# Patient Record
Sex: Female | Born: 1950
Health system: Southern US, Community
[De-identification: ages and names within clinical notes are randomized; demographics above are authoritative.]

## PROBLEM LIST (undated history)

## (undated) DIAGNOSIS — I10 Essential (primary) hypertension: Secondary | ICD-10-CM

## (undated) DIAGNOSIS — Z9289 Personal history of other medical treatment: Secondary | ICD-10-CM

## (undated) DIAGNOSIS — I2699 Other pulmonary embolism without acute cor pulmonale: Secondary | ICD-10-CM

## (undated) DIAGNOSIS — M1990 Unspecified osteoarthritis, unspecified site: Secondary | ICD-10-CM

## (undated) DIAGNOSIS — R569 Unspecified convulsions: Secondary | ICD-10-CM

## (undated) DIAGNOSIS — J302 Other seasonal allergic rhinitis: Secondary | ICD-10-CM

## (undated) DIAGNOSIS — Z8711 Personal history of peptic ulcer disease: Secondary | ICD-10-CM

## (undated) DIAGNOSIS — C641 Malignant neoplasm of right kidney, except renal pelvis: Secondary | ICD-10-CM

## (undated) DIAGNOSIS — IMO0002 Reserved for concepts with insufficient information to code with codable children: Secondary | ICD-10-CM

## (undated) DIAGNOSIS — D649 Anemia, unspecified: Secondary | ICD-10-CM

## (undated) DIAGNOSIS — E785 Hyperlipidemia, unspecified: Secondary | ICD-10-CM

## (undated) DIAGNOSIS — J189 Pneumonia, unspecified organism: Secondary | ICD-10-CM

## (undated) DIAGNOSIS — K219 Gastro-esophageal reflux disease without esophagitis: Secondary | ICD-10-CM

## (undated) DIAGNOSIS — E119 Type 2 diabetes mellitus without complications: Secondary | ICD-10-CM

## (undated) DIAGNOSIS — Z8719 Personal history of other diseases of the digestive system: Secondary | ICD-10-CM

## (undated) DIAGNOSIS — T7840XA Allergy, unspecified, initial encounter: Secondary | ICD-10-CM

## (undated) HISTORY — DX: Allergy, unspecified, initial encounter: T78.40XA

## (undated) HISTORY — DX: Essential (primary) hypertension: I10

## (undated) HISTORY — PX: COLONOSCOPY: SHX174

## (undated) HISTORY — PX: ECTOPIC PREGNANCY SURGERY: SHX613

## (undated) HISTORY — PX: JOINT REPLACEMENT: SHX530

## (undated) HISTORY — DX: Reserved for concepts with insufficient information to code with codable children: IMO0002

## (undated) HISTORY — PX: UPPER GASTROINTESTINAL ENDOSCOPY: SHX188

## (undated) HISTORY — PX: LUMBAR DISC SURGERY: SHX700

## (undated) HISTORY — PX: BACK SURGERY: SHX140

## (undated) HISTORY — DX: Hyperlipidemia, unspecified: E78.5

---

## 1985-03-30 HISTORY — PX: ABDOMINAL HYSTERECTOMY: SHX81

## 1998-03-30 DIAGNOSIS — C641 Malignant neoplasm of right kidney, except renal pelvis: Secondary | ICD-10-CM

## 1998-03-30 HISTORY — PX: NEPHRECTOMY: SHX65

## 1998-03-30 HISTORY — DX: Malignant neoplasm of right kidney, except renal pelvis: C64.1

## 1998-05-13 ENCOUNTER — Inpatient Hospital Stay (HOSPITAL_COMMUNITY): Admission: RE | Admit: 1998-05-13 | Discharge: 1998-05-16 | Payer: Self-pay | Admitting: Urology

## 1999-02-28 ENCOUNTER — Encounter: Admission: RE | Admit: 1999-02-28 | Discharge: 1999-02-28 | Payer: Self-pay | Admitting: Urology

## 1999-02-28 ENCOUNTER — Encounter: Payer: Self-pay | Admitting: Urology

## 1999-09-03 ENCOUNTER — Encounter: Admission: RE | Admit: 1999-09-03 | Discharge: 1999-09-03 | Payer: Self-pay | Admitting: Urology

## 1999-09-03 ENCOUNTER — Encounter: Payer: Self-pay | Admitting: Urology

## 2000-02-25 ENCOUNTER — Encounter: Admission: RE | Admit: 2000-02-25 | Discharge: 2000-02-25 | Payer: Self-pay | Admitting: Urology

## 2000-02-25 ENCOUNTER — Encounter: Payer: Self-pay | Admitting: Urology

## 2000-06-07 ENCOUNTER — Emergency Department (HOSPITAL_COMMUNITY): Admission: EM | Admit: 2000-06-07 | Discharge: 2000-06-08 | Payer: Self-pay | Admitting: Emergency Medicine

## 2000-09-14 ENCOUNTER — Encounter: Admission: RE | Admit: 2000-09-14 | Discharge: 2000-09-14 | Payer: Self-pay | Admitting: Urology

## 2000-09-14 ENCOUNTER — Encounter: Payer: Self-pay | Admitting: Urology

## 2001-03-21 ENCOUNTER — Encounter: Payer: Self-pay | Admitting: Urology

## 2001-03-21 ENCOUNTER — Encounter: Admission: RE | Admit: 2001-03-21 | Discharge: 2001-03-21 | Payer: Self-pay | Admitting: Urology

## 2001-09-16 ENCOUNTER — Encounter: Admission: RE | Admit: 2001-09-16 | Discharge: 2001-09-16 | Payer: Self-pay | Admitting: Urology

## 2001-09-16 ENCOUNTER — Encounter: Payer: Self-pay | Admitting: Urology

## 2002-03-03 ENCOUNTER — Encounter: Payer: Self-pay | Admitting: Urology

## 2002-03-03 ENCOUNTER — Encounter: Admission: RE | Admit: 2002-03-03 | Discharge: 2002-03-03 | Payer: Self-pay | Admitting: Urology

## 2002-09-08 ENCOUNTER — Ambulatory Visit (HOSPITAL_COMMUNITY): Admission: RE | Admit: 2002-09-08 | Discharge: 2002-09-08 | Payer: Self-pay | Admitting: Urology

## 2002-09-08 ENCOUNTER — Encounter: Payer: Self-pay | Admitting: Urology

## 2003-01-30 ENCOUNTER — Ambulatory Visit (HOSPITAL_COMMUNITY): Admission: RE | Admit: 2003-01-30 | Discharge: 2003-01-30 | Payer: Self-pay | Admitting: Gastroenterology

## 2003-03-13 ENCOUNTER — Ambulatory Visit (HOSPITAL_COMMUNITY): Admission: RE | Admit: 2003-03-13 | Discharge: 2003-03-13 | Payer: Self-pay | Admitting: Urology

## 2004-03-05 ENCOUNTER — Ambulatory Visit (HOSPITAL_COMMUNITY): Admission: RE | Admit: 2004-03-05 | Discharge: 2004-03-05 | Payer: Self-pay | Admitting: Urology

## 2004-04-24 ENCOUNTER — Ambulatory Visit: Payer: Self-pay | Admitting: Gastroenterology

## 2004-05-05 ENCOUNTER — Ambulatory Visit: Payer: Self-pay | Admitting: Gastroenterology

## 2007-08-30 ENCOUNTER — Emergency Department (HOSPITAL_COMMUNITY): Admission: EM | Admit: 2007-08-30 | Discharge: 2007-08-31 | Payer: Self-pay | Admitting: Emergency Medicine

## 2010-05-01 ENCOUNTER — Inpatient Hospital Stay: Admission: AD | Admit: 2010-05-01 | Payer: Self-pay

## 2010-08-22 ENCOUNTER — Emergency Department (HOSPITAL_BASED_OUTPATIENT_CLINIC_OR_DEPARTMENT_OTHER)
Admission: EM | Admit: 2010-08-22 | Discharge: 2010-08-22 | Disposition: A | Discharge status: Home / Self Care | Payer: BC Managed Care – PPO | Attending: Emergency Medicine | Admitting: Emergency Medicine

## 2010-08-22 DIAGNOSIS — Z79899 Other long term (current) drug therapy: Secondary | ICD-10-CM | POA: Insufficient documentation

## 2010-08-22 DIAGNOSIS — R040 Epistaxis: Secondary | ICD-10-CM | POA: Insufficient documentation

## 2010-08-22 DIAGNOSIS — E119 Type 2 diabetes mellitus without complications: Secondary | ICD-10-CM | POA: Insufficient documentation

## 2010-08-22 DIAGNOSIS — I10 Essential (primary) hypertension: Secondary | ICD-10-CM | POA: Insufficient documentation

## 2010-08-22 LAB — DIFFERENTIAL
Basophils Absolute: 0 10*3/uL (ref 0.0–0.1)
Basophils Relative: 0 % (ref 0–1)
Eosinophils Absolute: 0.2 10*3/uL (ref 0.0–0.7)
Eosinophils Relative: 1 % (ref 0–5)
Lymphocytes Relative: 11 % — ABNORMAL LOW (ref 12–46)
Lymphs Abs: 1.4 10*3/uL (ref 0.7–4.0)
Monocytes Absolute: 0.4 10*3/uL (ref 0.1–1.0)
Monocytes Relative: 3 % (ref 3–12)
Neutro Abs: 11.1 10*3/uL — ABNORMAL HIGH (ref 1.7–7.7)
Neutrophils Relative %: 85 % — ABNORMAL HIGH (ref 43–77)

## 2010-08-22 LAB — CBC
HCT: 35.3 % — ABNORMAL LOW (ref 36.0–46.0)
Hemoglobin: 11.7 g/dL — ABNORMAL LOW (ref 12.0–15.0)
MCH: 30.8 pg (ref 26.0–34.0)
MCHC: 33.1 g/dL (ref 30.0–36.0)
MCV: 92.9 fL (ref 78.0–100.0)
Platelets: 343 10*3/uL (ref 150–400)
RBC: 3.8 MIL/uL — ABNORMAL LOW (ref 3.87–5.11)
RDW: 13.7 % (ref 11.5–15.5)
WBC: 13.1 10*3/uL — ABNORMAL HIGH (ref 4.0–10.5)

## 2010-08-22 LAB — PROTIME-INR
INR: 0.91 (ref 0.00–1.49)
Prothrombin Time: 12.5 seconds (ref 11.6–15.2)

## 2010-08-22 LAB — COMPREHENSIVE METABOLIC PANEL
ALT: 16 U/L (ref 0–35)
AST: 19 U/L (ref 0–37)
Albumin: 3.5 g/dL (ref 3.5–5.2)
Alkaline Phosphatase: 80 U/L (ref 39–117)
CO2: 26 mEq/L (ref 19–32)
Chloride: 104 mEq/L (ref 96–112)
Creatinine, Ser: 1.1 mg/dL (ref 0.4–1.2)
GFR calc Af Amer: >60 mL/min (ref >60)
GFR calc non Af Amer: 50 mL/min — ABNORMAL LOW (ref >60)
Potassium: 4.3 mEq/L (ref 3.5–5.1)
Total Bilirubin: 0.2 mg/dL — ABNORMAL LOW (ref 0.3–1.2)

## 2010-12-25 LAB — DIFFERENTIAL
Basophils Relative: 0
Eosinophils Absolute: 0.2
Lymphs Abs: 3.5
Monocytes Absolute: 0.4
Monocytes Relative: 5
Neutrophils Relative %: 57

## 2010-12-25 LAB — COMPREHENSIVE METABOLIC PANEL
ALT: 22
AST: 24
Albumin: 3.8
Alkaline Phosphatase: 101
Calcium: 9.4
GFR calc Af Amer: 60 — ABNORMAL LOW
Potassium: 3.8
Sodium: 135
Total Protein: 7.6

## 2010-12-25 LAB — CBC
Hemoglobin: 13.3
MCHC: 34.4
Platelets: 358
RDW: 13.2

## 2011-03-26 ENCOUNTER — Ambulatory Visit (INDEPENDENT_AMBULATORY_CARE_PROVIDER_SITE_OTHER): Payer: BC Managed Care – PPO

## 2011-03-26 DIAGNOSIS — E119 Type 2 diabetes mellitus without complications: Secondary | ICD-10-CM

## 2011-03-26 DIAGNOSIS — N760 Acute vaginitis: Secondary | ICD-10-CM

## 2011-03-26 DIAGNOSIS — N952 Postmenopausal atrophic vaginitis: Secondary | ICD-10-CM

## 2011-04-16 ENCOUNTER — Encounter: Payer: Self-pay | Admitting: Gastroenterology

## 2011-05-15 ENCOUNTER — Encounter: Payer: Self-pay | Admitting: Gastroenterology

## 2011-05-15 ENCOUNTER — Ambulatory Visit (AMBULATORY_SURGERY_CENTER): Payer: BC Managed Care – PPO | Admitting: *Deleted

## 2011-05-15 VITALS — Ht 68.0 in | Wt 250.0 lb

## 2011-05-15 DIAGNOSIS — Z86010 Personal history of colonic polyps: Secondary | ICD-10-CM

## 2011-05-15 DIAGNOSIS — Z1211 Encounter for screening for malignant neoplasm of colon: Secondary | ICD-10-CM

## 2011-05-15 MED ORDER — PEG-KCL-NACL-NASULF-NA ASC-C 100 G PO SOLR: ORAL | Status: DC

## 2011-05-28 ENCOUNTER — Ambulatory Visit (AMBULATORY_SURGERY_CENTER): Payer: BC Managed Care – PPO | Admitting: Gastroenterology

## 2011-05-28 ENCOUNTER — Encounter: Payer: Self-pay | Admitting: Gastroenterology

## 2011-05-28 DIAGNOSIS — Z860100 Personal history of colon polyps, unspecified: Secondary | ICD-10-CM

## 2011-05-28 DIAGNOSIS — Z1211 Encounter for screening for malignant neoplasm of colon: Secondary | ICD-10-CM

## 2011-05-28 DIAGNOSIS — K5730 Diverticulosis of large intestine without perforation or abscess without bleeding: Secondary | ICD-10-CM

## 2011-05-28 MED ORDER — SODIUM CHLORIDE 0.9 % IV SOLN: 500.0000 mL | INTRAVENOUS | Status: DC

## 2011-05-28 NOTE — Op Note (Signed)
[facility] [address] [place], [place]  [address]  COLONOSCOPY PROCEDURE REPORT  PATIENT:  Cassiopeia, Florentino  MR#:  [id number] BIRTHDATE:  15-Feb-1951, 60 yrs. old  GENDER:  female ENDOSCOPIST:  Barbette Hair. Arlyce Dice, MD REF. BY:  Tracey Harries, M.D. PROCEDURE DATE:  05/28/2011 PROCEDURE:  Diagnostic Colonoscopy ASA CLASS:  Class II INDICATIONS:  Screening, history of pre-cancerous (adenomatous) colon polyps 2004 colo - adenomatous polyps 2006 - hyperplastic polyps MEDICATIONS:   MAC sedation, administered by CRNA propofol 240mg IV  DESCRIPTION OF PROCEDURE:   After the risks benefits and alternatives of the procedure were thoroughly explained, informed consent was obtained.  Digital rectal exam was performed and revealed no abnormalities.   The LB 180AL [id number] endoscope was introduced through the anus and advanced to the cecum, which was identified by both the appendix and ileocecal valve, without limitations.  The quality of the prep was good, using MoviPrep. The instrument was then slowly withdrawn as the colon was fully examined. <<PROCEDUREIMAGES>>  FINDINGS:  Mild diverticulosis was found in the sigmoid colon (see image3).  This was otherwise a normal examination of the colon (see image2 and image4).   Retroflexed views in the rectum revealed no abnormalities.    The time to cecum =  1) 10.0 minutes. The scope was then withdrawn in  1) 12.0  minutes from the cecum and the procedure completed. COMPLICATIONS:  None ENDOSCOPIC IMPRESSION: 1) Mild diverticulosis in the sigmoid colon 2) Otherwise normal examination RECOMMENDATIONS: 1) Return to the care of your primary provider. GI follow up as needed REPEAT EXAM:  In 10 year(s) for Colonoscopy.  ______________________________ Barbette Hair. Arlyce Dice, MD  CC:  n. eSIGNED:   Barbette Hair. Kelliann Pendergraph at 05/28/2011 08:56 AM  Oneita Hurt, [phone]

## 2011-05-28 NOTE — Patient Instructions (Signed)
Discharge instructions given with verbal understanding. Handout on diverticulosis given. Resume previous medications. YOU HAD AN ENDOSCOPIC PROCEDURE TODAY AT THE [facility]: Refer to the procedure report that was given to you for any specific questions about what was found during the examination.  If the procedure report does not answer your questions, please call your gastroenterologist to clarify.  If you requested that your care partner not be given the details of your procedure findings, then the procedure report has been included in a sealed envelope for you to review at your convenience later.  YOU SHOULD EXPECT: Some feelings of bloating in the abdomen. Passage of more gas than usual.  Walking can help get rid of the air that was put into your GI tract during the procedure and reduce the bloating. If you had a lower endoscopy (such as a colonoscopy or flexible sigmoidoscopy) you may notice spotting of blood in your stool or on the toilet paper. If you underwent a bowel prep for your procedure, then you may not have a normal bowel movement for a few days.  DIET: Your first meal following the procedure should be a light meal and then it is ok to progress to your normal diet.  A half-sandwich or bowl of soup is an example of a good first meal.  Heavy or fried foods are harder to digest and may make you feel nauseous or bloated.  Likewise meals heavy in dairy and vegetables can cause extra gas to form and this can also increase the bloating.  Drink plenty of fluids but you should avoid alcoholic beverages for 24 hours.  ACTIVITY: Your care partner should take you home directly after the procedure.  You should plan to take it easy, moving slowly for the rest of the day.  You can resume normal activity the day after the procedure however you should NOT DRIVE or use heavy machinery for 24 hours (because of the sedation medicines used during the test).    SYMPTOMS TO REPORT IMMEDIATELY: A  gastroenterologist can be reached at any hour.  During normal business hours, 8:30 AM to 5:00 PM Monday through Friday, call [phone].  After hours and on weekends, please call the GI answering service at [phone] who will take a message and have the physician on call contact you.   Following lower endoscopy (colonoscopy or flexible sigmoidoscopy):  Excessive amounts of blood in the stool  Significant tenderness or worsening of abdominal pains  Swelling of the abdomen that is new, acute  Fever of 100F or higher  FOLLOW UP: If any biopsies were taken you will be contacted by phone or by letter within the next 1-3 weeks.  Call your gastroenterologist if you have not heard about the biopsies in 3 weeks.  Our staff will call the home number listed on your records the next business day following your procedure to check on you and address any questions or concerns that you may have at that time regarding the information given to you following your procedure. This is a courtesy call and so if there is no answer at the home number and we have not heard from you through the emergency physician on call, we will assume that you have returned to your regular daily activities without incident.  SIGNATURES/CONFIDENTIALITY: You and/or your care partner have signed paperwork which will be entered into your electronic medical record.  These signatures attest to the fact that that the information above on your After Visit Summary has   been reviewed and is understood.  Full responsibility of the confidentiality of this discharge information lies with you and/or your care-partner. 

## 2011-05-28 NOTE — Progress Notes (Signed)
Patient did not experience any of the following events: a burn prior to discharge; a fall within the facility; wrong site/side/patient/procedure/implant event; or a hospital transfer or hospital admission upon discharge from the facility. (G8907) Patient did not have preoperative order for IV antibiotic SSI prophylaxis. (G8918)  

## 2011-05-29 ENCOUNTER — Telehealth: Payer: Self-pay | Admitting: *Deleted

## 2011-05-29 NOTE — Telephone Encounter (Signed)
  Follow up Call-  Call back number 05/28/2011  Post procedure Call Back phone  # [phone]  Permission to leave phone message Yes     Patient questions:  Do you have a fever, pain , or abdominal swelling? no Pain Score  0 *  Have you tolerated food without any problems? yes  Have you been able to return to your normal activities? yes  Do you have any questions about your discharge instructions: Diet   no Medications  no Follow up visit  no  Do you have questions or concerns about your Care? no  Actions: * If pain score is 4 or above: No action needed, pain <4.

## 2011-07-07 ENCOUNTER — Ambulatory Visit: Payer: BC Managed Care – PPO | Admitting: Family Medicine

## 2011-07-07 VITALS — BP 118/70 | HR 103 | Temp 99.0°F | Resp 18 | Ht 67.0 in | Wt 249.4 lb

## 2011-07-07 DIAGNOSIS — E119 Type 2 diabetes mellitus without complications: Secondary | ICD-10-CM

## 2011-07-07 DIAGNOSIS — J309 Allergic rhinitis, unspecified: Secondary | ICD-10-CM

## 2011-07-07 DIAGNOSIS — R05 Cough: Secondary | ICD-10-CM

## 2011-07-07 DIAGNOSIS — Z9109 Other allergy status, other than to drugs and biological substances: Secondary | ICD-10-CM

## 2011-07-07 DIAGNOSIS — J683 Other acute and subacute respiratory conditions due to chemicals, gases, fumes and vapors: Secondary | ICD-10-CM

## 2011-07-07 LAB — POCT CBC
Granulocyte percent: 58.5 %G (ref 37–80)
MID (cbc): 0.8 (ref 0–0.9)
MPV: 9.1 fL (ref 0–99.8)
POC Granulocyte: 5.2 (ref 2–6.9)
POC MID %: 8.9 %M (ref 0–12)
Platelet Count, POC: 349 10*3/uL (ref 142–424)
RBC: 4.22 M/uL (ref 4.04–5.48)

## 2011-07-07 MED ORDER — BUDESONIDE-FORMOTEROL FUMARATE 160-4.5 MCG/ACT IN AERO: 1.0000 | INHALATION_SPRAY | Freq: Every day | RESPIRATORY_TRACT | Status: DC

## 2011-07-07 NOTE — Progress Notes (Signed)
  Subjective:    Patient ID: Laura Neal, female    DOB: [birth year], 61 y.o.   MRN: [id number]  HPI  Patient presents with 2 day history of post nasal drainage and cough. Otalgia (R) > (L)  and scratchy throat Occasional chills Myalgias >  Legs  A1C 6.2  No  History of diabetic neuropathy  Some leg swelling; denies CP or orthopnea; attempts to monitor sodium  Stressors caring for mother who was recently hospitalized secondary to pneumonia and CHF.  Review of Systems     Objective:   Physical Exam  Constitutional: She appears well-developed.  HENT:  Right Ear: Tympanic membrane is retracted.  Left Ear: Tympanic membrane is retracted.  Nose: Mucosal edema present.  Eyes: Conjunctivae are normal.  Neck: Neck supple. No thyromegaly present.  Cardiovascular: Normal rate, regular rhythm and normal heart sounds.   Pulmonary/Chest: Wheezes: coarse BS associated with prolong expiratory phase.  Neurological: She is alert.  Skin: Skin is warm.      Results for orders placed in visit on 07/07/11  POCT CBC      Component Value Range   WBC 8.9  4.6 - 10.2 (K/uL)   Lymph, poc 2.9  0.6 - 3.4    POC LYMPH PERCENT 32.6  10 - 50 (%L)   MID (cbc) 0.8  0 - 0.9    POC MID % 8.9  0 - 12 (%M)   POC Granulocyte 5.2  2 - 6.9    Granulocyte percent 58.5  37 - 80 (%G)   RBC 4.22  4.04 - 5.48 (M/uL)   Hemoglobin 12.6  12.2 - 16.2 (g/dL)   HCT, POC 40.9  81.1 - 47.9 (%)   MCV 94.2  80 - 97 (fL)   MCH, POC 29.9  27 - 31.2 (pg)   MCHC 31.7 (*) 31.8 - 35.4 (g/dL)   RDW, POC 91.4     Platelet Count, POC 349  142 - 424 (K/uL)   MPV 9.1  0 - 99.8 (fL)   Peak flow- 300(poor techniquie)     Assessment & Plan:   1. Environmental allergies    2. Reactive airways dysfunction syndrome  budesonide-formoterol (SYMBICORT) 160-4.5 MCG/ACT inhaler  3. Cough  POCT CBC, Peak flow, Peak flow, budesonide-formoterol (SYMBICORT) 160-4.5 MCG/ACT inhaler   Patinet to use the flonase and zyrtec she has at  home. Coupon for Symbicort provided Call if symptoms persist or worsen

## 2011-10-21 ENCOUNTER — Encounter: Payer: Self-pay | Admitting: Gastroenterology

## 2012-11-26 ENCOUNTER — Ambulatory Visit: Payer: BC Managed Care – PPO | Admitting: Family Medicine

## 2012-11-26 VITALS — BP 128/84 | HR 113 | Temp 98.0°F | Resp 17 | Ht 67.0 in | Wt 252.0 lb

## 2012-11-26 DIAGNOSIS — J329 Chronic sinusitis, unspecified: Secondary | ICD-10-CM

## 2012-11-26 MED ORDER — FLUTICASONE PROPIONATE 50 MCG/ACT NA SUSP: 2.0000 | Freq: Every day | NASAL | Status: DC

## 2012-11-26 MED ORDER — AMOXICILLIN 875 MG PO TABS: 875.0000 mg | ORAL_TABLET | Freq: Two times a day (BID) | ORAL | Status: DC

## 2012-11-26 NOTE — Progress Notes (Signed)
Patient ID: Laura Neal MRN: [id number], DOB: [birth year], 62 y.o. Date of Encounter: 11/26/2012, 5:38 PM  Primary Physician: Aura Dials, MD  Chief Complaint:  Chief Complaint  Patient presents with  . Sinusitis  . Cough  . Chest Pain    HPI: 62 y.o. year old female presents with 2 day history of nasal congestion, post nasal drip, sore throat, sinus pressure, and cough. Afebrile. No chills. Nasal congestion thick and green/yellow. Sinus pressure is the worst symptom. Cough is productive secondary to post nasal drip and not associated with time of day. Ears feel full, leading to sensation of muffled hearing. Has tried OTC cold preps without success. No GI complaints.   No recent antibiotics, recent travels, or sick contacts   No leg trauma, sedentary periods, h/o cancer, or tobacco use.  Past Medical History  Diagnosis Date  . Cancer 2000    Kidney  . Diabetes mellitus   . Hypertension   . Hyperlipidemia   . Ulcer   . Blood transfusion   . Allergy      Home Meds: Prior to Admission medications   Medication Sig Start Date End Date Taking? Authorizing Provider  aspirin 81 MG tablet Take 81 mg by mouth daily.   Yes Historical Provider, MD  aspirin 81 MG tablet Take 81 mg by mouth daily.   Yes Historical Provider, MD  budesonide-formoterol (SYMBICORT) 160-4.5 MCG/ACT inhaler Inhale 1 puff into the lungs daily. 07/07/11 11/26/12 Yes Dois Davenport, MD  Cinnamon 500 MG capsule Take 500 mg by mouth daily.   Yes Historical Provider, MD  CINNAMON PO Take 1,000 mg by mouth daily.   Yes Historical Provider, MD  Exenatide (BYETTA 10 MCG PEN [place]) Inject 10 mcg into the skin 2 (two) times daily with a meal.   Yes Historical Provider, MD  exenatide (BYETTA) 10 MCG/0.04ML SOLN Inject into the skin 2 (two) times daily with a meal.   Yes Historical Provider, MD  glipiZIDE (GLUCOTROL) 10 MG tablet Take 10 mg by mouth 2 (two) times daily before a meal.   Yes Historical Provider, MD    glipiZIDE (GLUCOTROL) 10 MG tablet Take 10 mg by mouth 2 (two) times daily before a meal.   Yes Historical Provider, MD  hydrocortisone 2.5 % lotion Apply 1 application topically as needed.   Yes Historical Provider, MD  hydrocortisone 2.5 % lotion Apply topically once.   Yes Historical Provider, MD  losartan-hydrochlorothiazide (HYZAAR) 100-12.5 MG per tablet Take 1 tablet by mouth Nightly.   Yes Historical Provider, MD  losartan-hydrochlorothiazide (HYZAAR) 100-12.5 MG per tablet Take 1 tablet by mouth daily.   Yes Historical Provider, MD  metFORMIN (GLUCOPHAGE) 1000 MG tablet Take 1,000 mg by mouth 2 (two) times daily with a meal.   Yes Historical Provider, MD  metFORMIN (GLUCOPHAGE) 1000 MG tablet Take 1,000 mg by mouth 2 (two) times daily with a meal.   Yes Historical Provider, MD  pravastatin (PRAVACHOL) 80 MG tablet Take 80 mg by mouth daily.   Yes Historical Provider, MD  pravastatin (PRAVACHOL) 80 MG tablet Take 80 mg by mouth daily.   Yes Historical Provider, MD  triamcinolone cream (KENALOG) 0.1 % Apply 1 application topically as needed.   Yes Historical Provider, MD  triamcinolone cream (KENALOG) 0.1 % Apply topically once.   Yes Historical Provider, MD    Allergies:  Allergies  Allergen Reactions  . Codeine   . Codeine Rash    History   Social History  .  Marital Status: Widowed    Spouse Name: N/A    Number of Children: N/A  . Years of Education: N/A   Occupational History  . Not on file.   Social History Main Topics  . Smoking status: Former Smoker    Quit date: 03/30/2004  . Smokeless tobacco: Not on file  . Alcohol Use: No  . Drug Use: No  . Sexual Activity: No   Other Topics Concern  . Not on file   Social History Narrative   ** Merged History Encounter **         Review of Systems: Constitutional: negative for chills, fever, night sweats or weight changes Cardiovascular: negative for chest pain or palpitations Respiratory: negative for hemoptysis,  wheezing, or shortness of breath Abdominal: negative for abdominal pain, nausea, vomiting or diarrhea Dermatological: negative for rash Neurologic: negative for headache   Physical Exam: Blood pressure 128/84, pulse 113, temperature 98 F (36.7 C), temperature source Oral, resp. rate 17, height 5\' 7"  (1.702 m), weight 252 lb (114.306 kg), SpO2 96.00%., Body mass index is 39.46 kg/(m^2). General: Well developed, well nourished, in no acute distress. Head: Normocephalic, atraumatic, eyes without discharge, sclera non-icteric, nares are congested. Bilateral auditory canals clear, TM's are without perforation, pearly grey with reflective cone of light bilaterally. Serous effusion bilaterally behind TM's. Maxillary sinus TTP. Oral cavity moist, dentition normal. Posterior pharynx with post nasal drip and mild erythema. No peritonsillar abscess or tonsillar exudate. Neck: Supple. No thyromegaly. Full ROM. No lymphadenopathy. Lungs: Clear bilaterally to auscultation without wheezes, rales, or rhonchi. Breathing is unlabored.  Heart: RRR with S1 S2. No murmurs, rubs, or gallops appreciated. Msk:  Strength and tone normal for age. Extremities: No clubbing or cyanosis. No edema. Neuro: Alert and oriented X 3. Moves all extremities spontaneously. CNII-XII grossly in tact. Psych:  Responds to questions appropriately with a normal affect.   Labs:   ASSESSMENT AND PLAN:  62 y.o. year old female with sinusitis -  -Tylenol/Motrin prn -Rest/fluids -RTC precautions -RTC 3-5 days if no improvement  Signed, Elvina Sidle, MD 11/26/2012 5:38 PM

## 2012-11-26 NOTE — Patient Instructions (Addendum)

## 2012-11-29 ENCOUNTER — Telehealth: Payer: Self-pay

## 2012-11-29 NOTE — Telephone Encounter (Signed)
PATIENT STATES SHE WAS IN THE OFFICE ON Saturday AND SAW DR. Milus Glazier FOR A SINUS INFECTION. HE GAVE HER AMOXICILLIN WHICH HAS GIVEN HER A YEAST INFECTION. SHE WOULD LIKE TO HAVE SOMETHING CALLED INTO THE PHARMACY FOR IT PLEASE. BEST PHONE  [phone] (HOME)    PHARMACY CHOICE IS [facility] ON [address].   MBC

## 2012-11-30 ENCOUNTER — Other Ambulatory Visit: Payer: Self-pay | Admitting: Family Medicine

## 2012-11-30 MED ORDER — FLUCONAZOLE 150 MG PO TABS: 150.0000 mg | ORAL_TABLET | Freq: Once | ORAL | Status: DC

## 2012-12-06 ENCOUNTER — Ambulatory Visit: Payer: BC Managed Care – PPO | Attending: Family Medicine

## 2012-12-06 DIAGNOSIS — R5381 Other malaise: Secondary | ICD-10-CM | POA: Insufficient documentation

## 2012-12-06 DIAGNOSIS — M25659 Stiffness of unspecified hip, not elsewhere classified: Secondary | ICD-10-CM | POA: Insufficient documentation

## 2012-12-06 DIAGNOSIS — M25559 Pain in unspecified hip: Secondary | ICD-10-CM | POA: Insufficient documentation

## 2012-12-06 DIAGNOSIS — IMO0001 Reserved for inherently not codable concepts without codable children: Secondary | ICD-10-CM | POA: Insufficient documentation

## 2012-12-08 ENCOUNTER — Ambulatory Visit: Payer: BC Managed Care – PPO | Admitting: Physical Therapy

## 2012-12-08 NOTE — Telephone Encounter (Signed)
Yes Diflucan was sent in for her.

## 2012-12-08 NOTE — Telephone Encounter (Signed)
12/08/2012 - Laura Neal - CAN THIS ENCOUNTER BE CLOSED OR IS IT STILL OPEN?   THANKS, MBC °

## 2012-12-13 ENCOUNTER — Ambulatory Visit: Payer: BC Managed Care – PPO | Admitting: Physical Therapy

## 2012-12-15 ENCOUNTER — Ambulatory Visit: Payer: BC Managed Care – PPO | Admitting: Physical Therapy

## 2012-12-20 ENCOUNTER — Ambulatory Visit: Payer: BC Managed Care – PPO

## 2012-12-22 ENCOUNTER — Ambulatory Visit: Payer: BC Managed Care – PPO | Admitting: Physical Therapy

## 2012-12-27 ENCOUNTER — Ambulatory Visit: Payer: BC Managed Care – PPO | Admitting: Physical Therapy

## 2012-12-29 ENCOUNTER — Ambulatory Visit: Payer: BC Managed Care – PPO | Attending: Family Medicine | Admitting: Physical Therapy

## 2012-12-29 DIAGNOSIS — M25659 Stiffness of unspecified hip, not elsewhere classified: Secondary | ICD-10-CM | POA: Insufficient documentation

## 2012-12-29 DIAGNOSIS — M25559 Pain in unspecified hip: Secondary | ICD-10-CM | POA: Insufficient documentation

## 2012-12-29 DIAGNOSIS — R5381 Other malaise: Secondary | ICD-10-CM | POA: Insufficient documentation

## 2012-12-29 DIAGNOSIS — IMO0001 Reserved for inherently not codable concepts without codable children: Secondary | ICD-10-CM | POA: Insufficient documentation

## 2013-01-03 ENCOUNTER — Ambulatory Visit: Payer: BC Managed Care – PPO | Admitting: Physical Therapy

## 2013-01-05 ENCOUNTER — Ambulatory Visit: Payer: BC Managed Care – PPO

## 2013-01-20 ENCOUNTER — Ambulatory Visit (INDEPENDENT_AMBULATORY_CARE_PROVIDER_SITE_OTHER): Payer: BC Managed Care – PPO | Admitting: Radiology

## 2013-01-20 DIAGNOSIS — Z23 Encounter for immunization: Secondary | ICD-10-CM

## 2013-03-15 ENCOUNTER — Ambulatory Visit: Payer: BC Managed Care – PPO | Admitting: Family Medicine

## 2013-03-15 VITALS — BP 122/70 | HR 91 | Temp 98.2°F | Resp 18 | Wt 251.0 lb

## 2013-03-15 DIAGNOSIS — N898 Other specified noninflammatory disorders of vagina: Secondary | ICD-10-CM

## 2013-03-15 DIAGNOSIS — L293 Anogenital pruritus, unspecified: Secondary | ICD-10-CM

## 2013-03-15 LAB — POCT UA - MICROSCOPIC ONLY
Crystals, Ur, HPF, POC: NEGATIVE
Epithelial cells, urine per micros: NEGATIVE
RBC, urine, microscopic: NEGATIVE
WBC, Ur, HPF, POC: NEGATIVE
Yeast, UA: NEGATIVE

## 2013-03-15 LAB — POCT URINALYSIS DIPSTICK
Leukocytes, UA: NEGATIVE
Nitrite, UA: NEGATIVE
Protein, UA: NEGATIVE
pH, UA: 5.5

## 2013-03-15 LAB — POCT WET PREP WITH KOH: WBC Wet Prep HPF POC: NEGATIVE

## 2013-03-15 MED ORDER — FLUCONAZOLE 150 MG PO TABS: 150.0000 mg | ORAL_TABLET | Freq: Once | ORAL | Status: DC

## 2013-03-15 NOTE — Patient Instructions (Signed)
It looks like you have a vaginal yeast infection.  Use the diflucan once- repeat in one week if needed.  If you are still not better then please give me a call- Sooner if worse.

## 2013-03-15 NOTE — Progress Notes (Signed)
[facility] [address], [place] [address] [phone]  Date:  03/15/2013   Name:  Laura Neal   DOB:  February 16, 1951   MRN:  [id number]  PCP:  Aura Dials, MD    Chief Complaint: Vaginitis   History of Present Illness:  Laura Neal is a 62 y.o. very pleasant female patient who presents with the following:  She is here today with vaginal burning and itching for about 2 days.  She has not noted any discharge.   No unusual urinary frequency.  She does notice that her urine seems to sting her skin when she urinates, but otherwise she does not have dysuria.    She feels like this is probably a yeast infection.   S/p hysterectomy  No fever, no vomiting.   Otherwise she feels well  She is a primary pt of Dr. Everlene Other and states that her DM is under good control.    There are no active problems to display for this patient.   Past Medical History  Diagnosis Date  . Cancer 2000    Kidney  . Diabetes mellitus   . Hypertension   . Hyperlipidemia   . Ulcer   . Blood transfusion   . Allergy     Past Surgical History  Procedure Laterality Date  . Abdominal hysterectomy  1987  . Ectopic pregnancy surgery    . 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 Back surgery      History  Substance Use Topics  . Smoking status: Former Smoker    Quit date: 03/30/2004  . Smokeless tobacco: Not on file  . Alcohol Use: No    Family History  Problem Relation Age of Onset  . Colon cancer Neg Hx   . Hypertension Mother   . Hypertension Father   . Diabetes Sister   . Cancer Sister   . Diabetes Brother   . Hypertension Brother     Allergies  Allergen Reactions  . Codeine   . Codeine Rash    Medication list has been reviewed and updated.  Current Outpatient Prescriptions on File Prior to Visit  Medication Sig Dispense Refill  . aspirin 81 MG tablet Take 81 mg by mouth daily.      . Exenatide (BYETTA 10 MCG PEN [place]) Inject 10 mcg into the skin 2  (two) times daily with a meal.      . exenatide (BYETTA) 10 MCG/0.04ML SOLN Inject into the skin 2 (two) times daily with a meal.      . fluticasone (FLONASE) 50 MCG/ACT nasal spray Place 2 sprays into the nose daily.  16 g  6  . hydrocortisone 2.5 % lotion Apply 1 application topically as needed.      [name] losartan-hydrochlorothiazide (HYZAAR) 100-12.5 MG per tablet Take 1 tablet by mouth Nightly.      . metFORMIN (GLUCOPHAGE) 1000 MG tablet Take 1,000 mg by mouth 2 (two) times daily with a meal.      . pravastatin (PRAVACHOL) 80 MG tablet Take 80 mg by mouth daily.      [name] triamcinolone cream (KENALOG) 0.1 % Apply 1 application topically as needed.      . fluconazole (DIFLUCAN) 150 MG tablet Take 1 tablet (150 mg total) by mouth once.  1 tablet  0   No current facility-administered medications on file prior to visit.    Review of Systems:  As per HPI- otherwise negative.   Physical Examination:  Filed Vitals:   03/15/13 1210  BP: 152/68  Pulse: 91  Temp: 98.2 F (36.8 C)  Resp: 18   Filed Vitals:   03/15/13 1210  Weight: 251 lb (113.853 kg)   Body mass index is 39.3 kg/(m^2). Ideal Body Weight:    GEN: WDWN, NAD, Non-toxic, A & O x 3, obese, looks wel HEENT: Atraumatic, Normocephalic. Neck supple. No masses, No LAD. Ears and Nose: No external deformity. CV: RRR, No M/G/R. No JVD. No thrill. No extra heart sounds. PULM: CTA B, no wheezes, crackles, rhonchi. No retractions. No resp. distress. No accessory muscle use. ABD: S, NT, ND, +BS. No rebound. No HSM. EXTR: No c/c/e NEURO Normal gait.  PSYCH: Normally interactive. Conversant. Not depressed or anxious appearing.  Calm demeanor.  Gu: cervix surgically absent.  No masses, lesions or discharge.  No tenderness   Results for orders placed in visit on 03/15/13  POCT WET PREP WITH KOH      Result Value Range   Trichomonas, UA Negative     Clue Cells Wet Prep HPF POC 1-3     Epithelial Wet Prep HPF POC 0-2     Yeast Wet  Prep HPF POC neg     Bacteria Wet Prep HPF POC 2+     RBC Wet Prep HPF POC 0-3     WBC Wet Prep HPF POC neg     KOH Prep POC Positive    POCT UA - MICROSCOPIC ONLY      Result Value Range   WBC, Ur, HPF, POC neg     RBC, urine, microscopic neg     Bacteria, U Microscopic neg     Mucus, UA neg     Epithelial cells, urine per micros neg     Crystals, Ur, HPF, POC neg     Casts, Ur, LPF, POC neg     Yeast, UA neg    POCT URINALYSIS DIPSTICK      Result Value Range   Color, UA yellow     Clarity, UA clear     Glucose, UA 1000     Bilirubin, UA neg     Ketones, UA neg     Spec Grav, UA <=1.005     Blood, UA neg     pH, UA 5.5     Protein, UA neg     Urobilinogen, UA 0.2     Nitrite, UA neg     Leukocytes, UA Negative       Assessment and Plan: Vaginal itching - Plan: POCT Wet Prep with KOH, fluconazole (DIFLUCAN) 150 MG tablet, POCT UA - Microscopic Only, POCT urinalysis dipstick  Will treat for yeast vaginitis with diflucan.  Will need to give her a call regarding her glycosuria tomorrow, she will follow-up with her PCP regarding this issue if her A1c is not UTD.   Signed Abbe Amsterdam, MD  Called to follow-up her glycosuria.  Could not reach her on her home or cell numbers but did LMOM that her urine is ok except for sugar in her urine.  Asked her to be sure to check her blood glucose and let us or Dr. Everlene Other know if she is running too high

## 2013-08-24 ENCOUNTER — Ambulatory Visit: Payer: BC Managed Care – PPO | Admitting: Family Medicine

## 2013-08-24 ENCOUNTER — Ambulatory Visit: Payer: BC Managed Care – PPO

## 2013-08-24 VITALS — BP 134/86 | HR 92 | Temp 98.4°F | Resp 18 | Ht 67.0 in | Wt 248.0 lb

## 2013-08-24 DIAGNOSIS — M25572 Pain in left ankle and joints of left foot: Secondary | ICD-10-CM

## 2013-08-24 DIAGNOSIS — M25579 Pain in unspecified ankle and joints of unspecified foot: Secondary | ICD-10-CM

## 2013-08-24 DIAGNOSIS — R609 Edema, unspecified: Secondary | ICD-10-CM

## 2013-08-24 DIAGNOSIS — E119 Type 2 diabetes mellitus without complications: Secondary | ICD-10-CM

## 2013-08-24 DIAGNOSIS — R600 Localized edema: Secondary | ICD-10-CM

## 2013-08-24 LAB — POCT CBC
Granulocyte percent: 60.2 %G (ref 37–80)
HCT, POC: 44.2 % (ref 37.7–47.9)
Hemoglobin: 13.7 g/dL (ref 12.2–16.2)
Lymph, poc: 3.5 — AB (ref 0.6–3.4)
MCH, POC: 29.4 pg (ref 27–31.2)
MCHC: 31 g/dL — AB (ref 31.8–35.4)
MCV: 94.8 fL (ref 80–97)
MID (cbc): 0.6 (ref 0–0.9)
MPV: 9.3 fL (ref 0–99.8)
POC Granulocyte: 6.3 (ref 2–6.9)
POC LYMPH PERCENT: 33.8 % (ref 10–50)
POC MID %: 6 % (ref 0–12)
Platelet Count, POC: 496 10*3/uL — AB (ref 142–424)
RBC: 4.66 M/uL (ref 4.04–5.48)
RDW, POC: 16 %
WBC: 10.5 10*3/uL — AB (ref 4.6–10.2)

## 2013-08-24 MED ORDER — PREDNISONE 20 MG PO TABS: ORAL_TABLET | ORAL | Status: DC

## 2013-08-24 MED ORDER — HYDROCODONE-ACETAMINOPHEN 5-325 MG PO TABS: 1.0000 | ORAL_TABLET | Freq: Three times a day (TID) | ORAL | Status: DC | PRN

## 2013-08-24 NOTE — Patient Instructions (Signed)

## 2013-08-24 NOTE — Progress Notes (Signed)
Chief Complaint:  Chief Complaint  Patient presents with  . Ankle Pain    lt ankle pain swollen x 2 days    HPI: Laura Neal is a 63 y.o. female who is here for 2 day history of right foot pain and swelling, some warmth, she has pain primarily on the top of her foot . She has tried rubbing it.  She is doing well with her diabetes. She does not have any nubmness ot tingling. NKI, no prior injuires. ON HCTZ for BP , no prior history of gout but mom has it. She has not had  any fevers or chills.  She states that after she wakes up in the morning she does not have swelling. She does not have kidney diseases. NO URi sxs or cough or SOB or CP.   Past Medical History  Diagnosis Date  . Cancer 2000    Kidney  . Diabetes mellitus   . Hypertension   . Hyperlipidemia   . Ulcer   . Blood transfusion   . Allergy    Past Surgical History  Procedure Laterality Date  . Abdominal hysterectomy  1987  . Ectopic pregnancy surgery    . 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 Back surgery     History   Social History  . Marital Status: Widowed    Spouse Name: N/A    Number of Children: N/A  . Years of Education: N/A   Social History Main Topics  . Smoking status: Former Smoker    Quit date: 03/30/2004  . Smokeless tobacco: None  . Alcohol Use: No  . Drug Use: No  . Sexual Activity: No   Other Topics Concern  . None   Social History Narrative   ** Merged History Encounter **       Family History  Problem Relation Age of Onset  . Colon cancer Neg Hx   . Hypertension Mother   . Hypertension Father   . Diabetes Sister   . Cancer Sister   . Diabetes Brother   . Hypertension Brother    Allergies  Allergen Reactions  . Codeine   . Codeine Rash   Prior to Admission medications   Medication Sig Start Date End Date Taking? Authorizing Provider  aspirin 81 MG tablet Take 81 mg by mouth daily.   Yes Historical Provider, MD  Canagliflozin (INVOKANA) 100 MG TABS Take  by mouth.   Yes Historical Provider, MD  Exenatide (BYETTA 10 MCG PEN [place]) Inject 10 mcg into the skin 2 (two) times daily with a meal.   Yes Historical Provider, MD  exenatide (BYETTA) 10 MCG/0.04ML SOLN Inject into the skin 2 (two) times daily with a meal.   Yes Historical Provider, MD  fluconazole (DIFLUCAN) 150 MG tablet Take 1 tablet (150 mg total) by mouth once. Repeat in one week if needed 03/15/13  Yes Gay Filler Copland, MD  fluticasone (FLONASE) 50 MCG/ACT nasal spray Place 2 sprays into the nose daily. 11/26/12  Yes Robyn Haber, MD  insulin glargine (LANTUS) 100 UNIT/ML injection Inject 100 Units into the skin at bedtime.   Yes Historical Provider, MD  losartan-hydrochlorothiazide (HYZAAR) 100-12.5 MG per tablet Take 1 tablet by mouth Nightly.   Yes Historical Provider, MD  metFORMIN (GLUCOPHAGE) 1000 MG tablet Take 1,000 mg by mouth 2 (two) times daily with a meal.   Yes Historical Provider, MD  pravastatin (PRAVACHOL) 80 MG tablet Take 80 mg by mouth  daily.   Yes Historical Provider, MD  triamcinolone cream (KENALOG) 0.1 % Apply 1 application topically as needed.   Yes Historical Provider, MD  hydrocortisone 2.5 % lotion Apply 1 application topically as needed.    Historical Provider, MD     ROS: The patient denies fevers, chills, night sweats, unintentional weight loss, chest pain, palpitations, wheezing, dyspnea on exertion, nausea, vomiting, abdominal pain, dysuria, hematuria, melena, numbness, weakness, or tingling.  All other systems have been reviewed and were otherwise negative with the exception of those mentioned in the HPI and as above.    PHYSICAL EXAM: Filed Vitals:   08/24/13 1549  BP: 134/86  Pulse: 92  Temp: 98.4 F (36.9 C)  Resp: 18   Filed Vitals:   08/24/13 1549  Height: 5\' 7"  (1.702 m)  Weight: 248 lb (112.492 kg)   Body mass index is 38.83 kg/(m^2).  General: Alert, no acute distress HEENT:  Normocephalic, atraumatic, oropharynx patent. EOMI,  PERRLA Cardiovascular:  Regular rate and rhythm, no rubs murmurs or gallops.  No Carotid bruits, radial pulse intact.  Respiratory: Clear to auscultation bilaterally.  No wheezes, rales, or rhonchi.  No cyanosis, no use of accessory musculature GI: No organomegaly, abdomen is soft and non-tender, positive bowel sounds.  No masses. Skin: No rashes. Neurologic: Facial musculature symmetric. Psychiatric: Patient is appropriate throughout our interaction. Lymphatic: No cervical lymphadenopathy Musculoskeletal: Gait intact. + tender and warm left foot, full ROM, non tender PF + edema  +1 only on foot and around ankle No e/o cellulitis No e/o leg swelling on other side, neg [place]   LABS: Results for orders placed in visit on 08/24/13  POCT CBC      Result Value Ref Range   WBC 10.5 (*) 4.6 - 10.2 K/uL   Lymph, poc 3.5 (*) 0.6 - 3.4   POC LYMPH PERCENT 33.8  10 - 50 %L   MID (cbc) 0.6  0 - 0.9   POC MID % 6.0  0 - 12 %M   POC Granulocyte 6.3  2 - 6.9   Granulocyte percent 60.2  37 - 80 %G   RBC 4.66  4.04 - 5.48 M/uL   Hemoglobin 13.7  12.2 - 16.2 g/dL   HCT, POC 44.2  37.7 - 47.9 %   MCV 94.8  80 - 97 fL   MCH, POC 29.4  27 - 31.2 pg   MCHC 31.0 (*) 31.8 - 35.4 g/dL   RDW, POC 16.0     Platelet Count, POC 496 (*) 142 - 424 K/uL   MPV 9.3  0 - 99.8 fL     EKG/XRAY:   Primary read interpreted by Dr. Marin Comment at [facility]. ? Gouty tophus, no fx or dislocation   ASSESSMENT/PLAN: Encounter Diagnoses  Name Primary?  . Edema of left foot Yes  . Left ankle pain   . Type II or unspecified type diabetes mellitus without mention of complication, not stated as uncontrolled    Likely inflammatory in origin reactive arthritis vs gout She cannot take NSAIDs due to only one kidney Will rx prednisone taper, elevate, compression sock Last HbA1c was 6  Will give prednisone taper for short course for presumptive gout, norco for pain Labs pending: CMP, GFR  Gross sideeffects, risk and benefits,  and alternatives of medications d/w patient. Patient is aware that all medications have potential sideeffects and we are unable to predict every sideeffect or drug-drug interaction that may occur.  Glenford Bayley, DO 08/24/2013 5:22 PM

## 2013-08-25 LAB — COMPLETE METABOLIC PANEL WITH GFR
Albumin: 4 g/dL (ref 3.5–5.2)
BUN: 14 mg/dL (ref 6–23)
CO2: 24 mEq/L (ref 19–32)
Calcium: 9.5 mg/dL (ref 8.4–10.5)
Chloride: 105 mEq/L (ref 96–112)
Creat: 1.22 mg/dL — ABNORMAL HIGH (ref 0.50–1.10)
GFR, Est Non African American: 47 mL/min — ABNORMAL LOW
Glucose, Bld: 133 mg/dL — ABNORMAL HIGH (ref 70–99)
Potassium: 4.1 mEq/L (ref 3.5–5.3)

## 2013-08-25 LAB — URIC ACID: Uric Acid, Serum: 5.7 mg/dL (ref 2.4–7.0)

## 2013-08-25 LAB — COMPLETE METABOLIC PANEL WITHOUT GFR
ALT: 15 U/L (ref 0–35)
AST: 18 U/L (ref 0–37)
Alkaline Phosphatase: 74 U/L (ref 39–117)
GFR, Est African American: 54 mL/min — ABNORMAL LOW
Sodium: 140 meq/L (ref 135–145)
Total Bilirubin: 0.3 mg/dL (ref 0.2–1.2)
Total Protein: 7.5 g/dL (ref 6.0–8.3)

## 2013-09-13 ENCOUNTER — Encounter: Payer: Self-pay | Admitting: Family Medicine

## 2014-12-09 ENCOUNTER — Ambulatory Visit (INDEPENDENT_AMBULATORY_CARE_PROVIDER_SITE_OTHER): Payer: Medicare Other | Admitting: Family Medicine

## 2014-12-09 VITALS — BP 128/68 | HR 81 | Temp 97.9°F | Resp 16 | Ht 68.0 in | Wt 241.0 lb

## 2014-12-09 DIAGNOSIS — E119 Type 2 diabetes mellitus without complications: Secondary | ICD-10-CM | POA: Diagnosis not present

## 2014-12-09 DIAGNOSIS — R109 Unspecified abdominal pain: Secondary | ICD-10-CM

## 2014-12-09 DIAGNOSIS — N183 Chronic kidney disease, stage 3 unspecified: Secondary | ICD-10-CM | POA: Insufficient documentation

## 2014-12-09 DIAGNOSIS — E1122 Type 2 diabetes mellitus with diabetic chronic kidney disease: Secondary | ICD-10-CM | POA: Insufficient documentation

## 2014-12-09 DIAGNOSIS — R300 Dysuria: Secondary | ICD-10-CM | POA: Diagnosis not present

## 2014-12-09 LAB — POCT WET PREP WITH KOH
Clue Cells Wet Prep HPF POC: NEGATIVE
KOH Prep POC: NEGATIVE
TRICHOMONAS UA: NEGATIVE
Yeast Wet Prep HPF POC: NEGATIVE

## 2014-12-09 LAB — POCT UA - MICROSCOPIC ONLY
Bacteria, U Microscopic: POSITIVE
CASTS, UR, LPF, POC: NEGATIVE
Crystals, Ur, HPF, POC: NEGATIVE
MUCUS UA: POSITIVE
RBC, urine, microscopic: NEGATIVE
WBC, Ur, HPF, POC: NEGATIVE
Yeast, UA: NEGATIVE

## 2014-12-09 LAB — POCT URINALYSIS DIPSTICK
BILIRUBIN UA: NEGATIVE
GLUCOSE UA: 100
Ketones, UA: NEGATIVE
Leukocytes, UA: NEGATIVE
Nitrite, UA: NEGATIVE
PH UA: 6
PROTEIN UA: NEGATIVE
RBC UA: NEGATIVE
Spec Grav, UA: 1.015
Urobilinogen, UA: 0.2

## 2014-12-09 MED ORDER — CEPHALEXIN 500 MG PO CAPS: 500.0000 mg | ORAL_CAPSULE | Freq: Two times a day (BID) | ORAL | Status: DC

## 2014-12-09 NOTE — Patient Instructions (Signed)
I will be in touch with your urine culture Take the keflex twice a day for one week If you are getting worse please let me know !

## 2014-12-09 NOTE — Progress Notes (Addendum)
[facility] [address], [place] [address] [phone]  Date:  12/09/2014   Name:  Laura Neal   DOB:  [birth year]   MRN:  [id number]  PCP:  Phineas Inches, MD    Chief Complaint: Dysuria; Flank Pain; and Urinary Frequency   History of Present Illness:  Laura Neal is a 64 y.o. very pleasant female patient who presents with the following:  She is here today with burning with urination, back pain, just not feeling quite well. Suspects that she has a UTI She has noted sx for 2 days No GI symptoms She notes some urinary frequency. No hematuria  She is on invokana- last A1c was approx 6.4%; this was done last month.  There is typically glucose in her urine because of this medication  She does not notice vaginal sx but would like to do a wet prep to rule- out BV  History of hysterectomy  There are no active problems to display for this patient.   Past Medical History  Diagnosis Date  . Cancer 2000    Kidney  . Diabetes mellitus   . Hypertension   . Hyperlipidemia   . Ulcer   . Blood transfusion   . Allergy     Past Surgical History  Procedure Laterality Date  . Abdominal hysterectomy  1987  . Ectopic pregnancy surgery    . 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 Back surgery      Social History  Substance Use Topics  . Smoking status: Former Smoker    Quit date: 03/30/2004  . Smokeless tobacco: None  . Alcohol Use: No    Family History  Problem Relation Age of Onset  . Colon cancer Neg Hx   . Hypertension Mother   . Hypertension Father   . Diabetes Sister   . Cancer Sister   . Diabetes Brother   . Hypertension Brother     Allergies  Allergen Reactions  . Codeine   . Codeine Rash    Medication list has been reviewed and updated.  Current Outpatient Prescriptions on File Prior to Visit  Medication Sig Dispense Refill  . aspirin 81 MG tablet Take 81 mg by mouth daily.    . Canagliflozin (INVOKANA) 100 MG TABS  Take by mouth.    . Exenatide (BYETTA 10 MCG PEN [place]) Inject 10 mcg into the skin 2 (two) times daily with a meal.    . exenatide (BYETTA) 10 MCG/0.04ML SOLN Inject into the skin 2 (two) times daily with a meal.    . fluticasone (FLONASE) 50 MCG/ACT nasal spray Place 2 sprays into the nose daily. 16 g 6  . hydrocortisone 2.5 % lotion Apply 1 application topically as needed.    . insulin glargine (LANTUS) 100 UNIT/ML injection Inject 100 Units into the skin at bedtime.    [name] losartan-hydrochlorothiazide (HYZAAR) 100-12.5 MG per tablet Take 1 tablet by mouth Nightly.    . metFORMIN (GLUCOPHAGE) 1000 MG tablet Take 1,000 mg by mouth 2 (two) times daily with a meal.    . pravastatin (PRAVACHOL) 80 MG tablet Take 80 mg by mouth daily.    . fluconazole (DIFLUCAN) 150 MG tablet Take 1 tablet (150 mg total) by mouth once. Repeat in one week if needed (Patient not taking: Reported on 12/09/2014) 2 tablet 0  . HYDROcodone-acetaminophen (NORCO) 5-325 MG per tablet Take 1 tablet by mouth every 8 (eight) hours as needed for moderate pain. (Patient  not taking: Reported on 12/09/2014) 30 tablet 0  . predniSONE (DELTASONE) 20 MG tablet Take 2 tabs po daily x 2 days , then 1 tab x 2 days , then 1/2 tab x 2 days. (Patient not taking: Reported on 12/09/2014) 10 tablet 0  . triamcinolone cream (KENALOG) 0.1 % Apply 1 application topically as needed.     No current facility-administered medications on file prior to visit.    Review of Systems:  As per HPI- otherwise negative. No vaginal sx such as discharge  Physical Examination: Filed Vitals:   12/09/14 1432  BP: 128/68  Pulse: 81  Temp: 97.9 F (36.6 C)  Resp: 16   Filed Vitals:   12/09/14 1432  Height: 5\' 8"  (1.727 m)  Weight: 241 lb (109.317 kg)   Body mass index is 36.65 kg/(m^2). Ideal Body Weight: Weight in (lb) to have BMI = 25: 164.1  GEN: WDWN, NAD, Non-toxic, A & O x 3, obese, looks well HEENT: Atraumatic, Normocephalic. Neck supple. No  masses, No LAD. Ears and Nose: No external deformity. CV: RRR, No M/G/R. No JVD. No thrill. No extra heart sounds. PULM: CTA B, no wheezes, crackles, rhonchi. No retractions. No resp. distress. No accessory muscle use. No CVA tenderness ABD: S, NT, ND EXTR: No c/c/e NEURO Normal gait.  PSYCH: Normally interactive. Conversant. Not depressed or anxious appearing.  Calm demeanor.   Results for orders placed or performed in visit on 12/09/14  POCT UA - Microscopic Only  Result Value Ref Range   WBC, Ur, HPF, POC neg    RBC, urine, microscopic neg    Bacteria, U Microscopic positive    Mucus, UA positive    Epithelial cells, urine per micros 0-1    Crystals, Ur, HPF, POC neg    Casts, Ur, LPF, POC neg    Yeast, UA neg   POCT urinalysis dipstick  Result Value Ref Range   Color, UA yellow    Clarity, UA clear    Glucose, UA 100    Bilirubin, UA neg    Ketones, UA neg    Spec Grav, UA 1.015    Blood, UA neg    pH, UA 6.0    Protein, UA neg    Urobilinogen, UA 0.2    Nitrite, UA neg    Leukocytes, UA Negative Negative  POCT Wet Prep with KOH  Result Value Ref Range   Trichomonas, UA Negative    Clue Cells Wet Prep HPF POC neg    Epithelial Wet Prep HPF POC Many Few, Moderate, Many   Yeast Wet Prep HPF POC neg    Bacteria Wet Prep HPF POC Many (A) None, Few   RBC Wet Prep HPF POC 0-3    WBC Wet Prep HPF POC 2-4    KOH Prep POC Negative     Assessment and Plan: Burning with urination - Plan: POCT UA - Microscopic Only, POCT urinalysis dipstick, POCT Wet Prep with KOH, Urine culture, cephALEXin (KEFLEX) 500 MG capsule  Right flank pain - Plan: POCT UA - Microscopic Only, POCT urinalysis dipstick  Diabetes mellitus type 2, controlled  Urine and wet prep are both fairly benign  Given her sx will treat with keflex while we await urine culture results She will let us know if any other concerns in the meantime  Glucosuria due to invokana  Signed Lamar Blinks,  MD  Received her urine culture- 35k of mixed colonies .  Called her and LMOM.  Low bacterial growth  may not indicate a true infection; if better nothing further needed, let me know if sx persist

## 2014-12-10 LAB — URINE CULTURE: Colony Count: 35000

## 2015-01-02 ENCOUNTER — Other Ambulatory Visit: Payer: Self-pay | Admitting: Dermatology

## 2015-02-13 ENCOUNTER — Encounter: Payer: Self-pay | Admitting: Gastroenterology

## 2015-06-14 ENCOUNTER — Emergency Department (HOSPITAL_COMMUNITY): Payer: Self-pay

## 2015-06-14 ENCOUNTER — Emergency Department (HOSPITAL_COMMUNITY)
Admission: EM | Admit: 2015-06-14 | Discharge: 2015-06-15 | Disposition: A | Discharge status: Home / Self Care | Payer: Self-pay | Attending: Emergency Medicine | Admitting: Emergency Medicine

## 2015-06-14 ENCOUNTER — Encounter (HOSPITAL_COMMUNITY): Payer: Self-pay | Admitting: Emergency Medicine

## 2015-06-14 DIAGNOSIS — Z79899 Other long term (current) drug therapy: Secondary | ICD-10-CM | POA: Insufficient documentation

## 2015-06-14 DIAGNOSIS — I10 Essential (primary) hypertension: Secondary | ICD-10-CM | POA: Insufficient documentation

## 2015-06-14 DIAGNOSIS — Z87891 Personal history of nicotine dependence: Secondary | ICD-10-CM | POA: Insufficient documentation

## 2015-06-14 DIAGNOSIS — N2889 Other specified disorders of kidney and ureter: Secondary | ICD-10-CM | POA: Insufficient documentation

## 2015-06-14 DIAGNOSIS — Z85528 Personal history of other malignant neoplasm of kidney: Secondary | ICD-10-CM | POA: Insufficient documentation

## 2015-06-14 DIAGNOSIS — E119 Type 2 diabetes mellitus without complications: Secondary | ICD-10-CM | POA: Insufficient documentation

## 2015-06-14 DIAGNOSIS — Z794 Long term (current) use of insulin: Secondary | ICD-10-CM | POA: Insufficient documentation

## 2015-06-14 DIAGNOSIS — Z8719 Personal history of other diseases of the digestive system: Secondary | ICD-10-CM | POA: Insufficient documentation

## 2015-06-14 DIAGNOSIS — Z7982 Long term (current) use of aspirin: Secondary | ICD-10-CM | POA: Insufficient documentation

## 2015-06-14 DIAGNOSIS — E785 Hyperlipidemia, unspecified: Secondary | ICD-10-CM | POA: Insufficient documentation

## 2015-06-14 DIAGNOSIS — Z7984 Long term (current) use of oral hypoglycemic drugs: Secondary | ICD-10-CM | POA: Insufficient documentation

## 2015-06-14 LAB — CBC
HEMATOCRIT: 35.3 % — AB (ref 36.0–46.0)
Hemoglobin: 11.3 g/dL — ABNORMAL LOW (ref 12.0–15.0)
MCH: 27.6 pg (ref 26.0–34.0)
MCHC: 32 g/dL (ref 30.0–36.0)
MCV: 86.1 fL (ref 78.0–100.0)
PLATELETS: 533 10*3/uL — AB (ref 150–400)
RBC: 4.1 MIL/uL (ref 3.87–5.11)
RDW: 15.5 % (ref 11.5–15.5)
WBC: 12.6 10*3/uL — AB (ref 4.0–10.5)

## 2015-06-14 LAB — URINALYSIS, ROUTINE W REFLEX MICROSCOPIC
Bilirubin Urine: NEGATIVE
Ketones, ur: NEGATIVE mg/dL
LEUKOCYTES UA: NEGATIVE
Nitrite: NEGATIVE
PH: 7 (ref 5.0–8.0)
Protein, ur: NEGATIVE mg/dL
SPECIFIC GRAVITY, URINE: 1.017 (ref 1.005–1.030)

## 2015-06-14 LAB — BASIC METABOLIC PANEL
Anion gap: 10 (ref 5–15)
BUN: 13 mg/dL (ref 6–20)
CALCIUM: 9.3 mg/dL (ref 8.9–10.3)
CO2: 27 mmol/L (ref 22–32)
Chloride: 104 mmol/L (ref 101–111)
Creatinine, Ser: 1.65 mg/dL — ABNORMAL HIGH (ref 0.44–1.00)
GFR calc Af Amer: 36 mL/min — ABNORMAL LOW (ref >60)
GFR, EST NON AFRICAN AMERICAN: 31 mL/min — AB (ref >60)
Glucose, Bld: 143 mg/dL — ABNORMAL HIGH (ref 65–99)
POTASSIUM: 3.9 mmol/L (ref 3.5–5.1)
SODIUM: 141 mmol/L (ref 135–145)

## 2015-06-14 LAB — URINE MICROSCOPIC-ADD ON: SQUAMOUS EPITHELIAL / LPF: NONE SEEN

## 2015-06-14 NOTE — ED Provider Notes (Signed)
CSN: [id number]     Arrival date & time 06/14/15  1724 History   First MD Initiated Contact with Patient 06/14/15 2212     Chief Complaint  Patient presents with  . Hematuria  . Dysuria   HPI The patient presents to the emergency room with complaints of hematuria that started about 3 days ago. When she first noticed the symptoms she went to her primary care doctor. She was given a prescription for antibiotics. She followed up with her doctor and was told that the culture results were negative for infection. Patient noticed she was having recurrent episodes of bleeding throughout the day today. She notices the blood when she is urinating. She did have an episode where she felt the urge to urinate and had some difficulty.  She urinated and saw a small clot and her symptoms resolved. She has not had any vaginal bleeding or other bleeding. She has a history of renal cell carcinoma and had a nephrectomy in 2000. She tried to get in with her urologist but was unable to get an appointment today. She has some urinary urgency. No fevers or chills. No vomiting. Past Medical History  Diagnosis Date  . Cancer ([place]) 2000    Kidney  . Diabetes mellitus   . Hypertension   . Hyperlipidemia   . Ulcer   . Blood transfusion   . Allergy    Past Surgical History  Procedure Laterality Date  . Abdominal hysterectomy  1987  . Ectopic pregnancy surgery    . 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 Back surgery     Family History  Problem Relation Age of Onset  . Colon cancer Neg Hx   . Hypertension Mother   . Hypertension Father   . Diabetes Sister   . Cancer Sister   . Diabetes Brother   . Hypertension Brother    Social History  Substance Use Topics  . Smoking status: Former Smoker    Quit date: 03/30/2004  . Smokeless tobacco: None  . Alcohol Use: No   OB History    No data available     Review of Systems  All other systems reviewed and are negative.     Allergies  Codeine  Home  Medications   Prior to Admission medications   Medication Sig Start Date End Date Taking? Authorizing Provider  aspirin 81 MG tablet Take 81 mg by mouth daily. Reported on 06/14/2015   Yes Historical Provider, MD  atorvastatin (LIPITOR) 80 MG tablet Take 80 mg by mouth daily. 03/21/15  Yes Historical Provider, MD  Canagliflozin (INVOKANA) 100 MG TABS Take 100 mg by mouth daily before breakfast.    Yes Historical Provider, MD  Exenatide (BYETTA 10 MCG PEN [place]) Inject 10 mcg into the skin 2 (two) times daily with a meal.   Yes Historical Provider, MD  fluticasone (FLONASE) 50 MCG/ACT nasal spray Place 2 sprays into the nose daily. Patient taking differently: Place 2 sprays into the nose daily as needed for allergies.  11/26/12  Yes Robyn Haber, MD  insulin glargine (LANTUS) 100 UNIT/ML injection Inject 46 Units into the skin at bedtime.    Yes Historical Provider, MD  losartan-hydrochlorothiazide (HYZAAR) 100-25 MG tablet Take 1 tablet by mouth daily. 05/07/15  Yes Historical Provider, MD  metFORMIN (GLUCOPHAGE) 1000 MG tablet Take 1,000 mg by mouth 2 (two) times daily with a meal.   Yes Historical Provider, MD  traMADol (ULTRAM) 50 MG tablet Take 50 mg by  mouth every 6 (six) hours as needed for moderate pain or severe pain.  05/22/15  Yes Historical Provider, MD  cephALEXin (KEFLEX) 500 MG capsule Take 1 capsule (500 mg total) by mouth 2 (two) times daily. Patient not taking: Reported on 06/14/2015 12/09/14   Darreld Mclean, MD  HYDROcodone-acetaminophen (NORCO) 5-325 MG per tablet Take 1 tablet by mouth every 8 (eight) hours as needed for moderate pain. Patient not taking: Reported on 12/09/2014 08/24/13   Thao P Le, DO   BP 139/84 mmHg  Pulse 82  Temp(Src) 98.3 F (36.8 C) (Oral)  Resp 16  SpO2 100% Physical Exam  Constitutional: She appears well-developed and well-nourished. No distress.  HENT:  Head: Normocephalic and atraumatic.  Right Ear: External ear normal.  Left Ear: External ear  normal.  Eyes: Conjunctivae are normal. Right eye exhibits no discharge. Left eye exhibits no discharge. No scleral icterus.  Neck: Neck supple. No tracheal deviation present.  Cardiovascular: Normal rate, regular rhythm and intact distal pulses.   Pulmonary/Chest: Effort normal and breath sounds normal. No stridor. No respiratory distress. She has no wheezes. She has no rales.  Abdominal: Soft. Bowel sounds are normal. She exhibits no distension. There is no tenderness. There is no rebound and no guarding.  Musculoskeletal: She exhibits no edema or tenderness.  Neurological: She is alert. She has normal strength. No cranial nerve deficit (no facial droop, extraocular movements intact, no slurred speech) or sensory deficit. She exhibits normal muscle tone. She displays no seizure activity. Coordination normal.  Skin: Skin is warm and dry. No rash noted.  Psychiatric: She has a normal mood and affect.  Nursing note and vitals reviewed.   ED Course  Procedures (including critical care time) Labs Review Labs Reviewed  URINALYSIS, ROUTINE W REFLEX MICROSCOPIC (NOT AT [facility]) - Abnormal; Notable for the following:    APPearance CLOUDY (*)    Glucose, UA >1000 (*)    Hgb urine dipstick LARGE (*)    All other components within normal limits  BASIC METABOLIC PANEL - Abnormal; Notable for the following:    Glucose, Bld 143 (*)    Creatinine, Ser 1.65 (*)    GFR calc non Af Amer 31 (*)    GFR calc Af Amer 36 (*)    All other components within normal limits  CBC - Abnormal; Notable for the following:    WBC 12.6 (*)    Hemoglobin 11.3 (*)    HCT 35.3 (*)    Platelets 533 (*)    All other components within normal limits  URINE MICROSCOPIC-ADD ON - Abnormal; Notable for the following:    Bacteria, UA RARE (*)    All other components within normal limits    Imaging Review Ct Abdomen Pelvis Wo Contrast  06/14/2015  CLINICAL DATA:  Acute onset of hematuria and dysuria. Initial encounter. EXAM:  CT ABDOMEN AND PELVIS WITHOUT CONTRAST TECHNIQUE: Multidetector CT imaging of the abdomen and pelvis was performed following the standard protocol without IV contrast. COMPARISON:  Pelvis radiograph performed 01/31/2010 FINDINGS: Multiple prominent nodules are noted throughout the visualized portions of the lungs, measuring up to 2.4 cm in size. These are highly suspicious for metastatic disease. The liver and spleen are unremarkable in appearance. The gallbladder is within normal limits. The pancreas and adrenal glands are unremarkable. The patient is status post right-sided nephrectomy. There appears to be a large 9.5 x 7.9 x 8.5 cm diffusely heterogeneous irregular mass involving much of the posterior left kidney. Additional  masses about the left kidney are nonspecific, measuring 3.1 cm and 2.4 cm in size, with surrounding perinephric stranding. Findings are concerning for renal cell carcinoma. No free fluid is identified. The small bowel is unremarkable in appearance. The stomach is within normal limits. No acute vascular abnormalities are seen. Scattered calcification is noted along the abdominal aorta and its branches. The appendix is normal in caliber, without evidence of appendicitis. Mild diverticulosis is noted along the distal descending and proximal sigmoid colon, without evidence of diverticulitis. Mild stranding is noted along the inferior left paracolic gutter, below the level of the mass. The bladder is moderately distended and grossly unremarkable. The patient is status post hysterectomy. No suspicious adnexal masses are seen. No inguinal lymphadenopathy is seen. No acute osseous abnormalities are identified. Prominent subcortical cystic change is noted at the right hip, with loss of the superior joint space. Mild vacuum phenomenon is noted at the lower lumbar spine. IMPRESSION: 1. Large 9.5 x 7.9 x 8.5 cm diffusely heterogeneous irregular mass involving much of the posterior left kidney, concerning  for renal cell carcinoma. Additional masses about the left kidney are nonspecific, measuring 3.1 cm and 2.4 cm in size. 2. Multiple prominent nodules throughout the visualized portions of the lungs, measuring up to 2.4 cm in size, highly suspicious for metastatic disease. 3. Scattered calcification along the abdominal aorta and its branches. 4. Mild diverticulosis along the distal descending and proximal sigmoid colon, without evidence of diverticulitis. Mild stranding along the inferior left paracolic gutter, below the level of the mass. 5. Degenerative change at the right hip, with loss of the superior joint space and prominent subcortical cystic change. Mild degenerative change at the lower lumbar spine. Electronically Signed   By: Garald Balding M.D.   On: 06/14/2015 23:48   I have personally reviewed and evaluated these images and lab results as part of my medical decision-making.    MDM   Final diagnoses:  Renal mass    Pt has a history of renal cell carcinoma.  Pt has had hematuria for the last few days.  CT scan performed to evaluate for renal mass and unfortunately she has a mass in the left kidney worrisome for renal cell carcinoma.  She also has some  Questionable lung mets.  CT scan shows moderate bladder distension however pt is able to urinate and does not feel that her bladder is distended.  She understands to return to the ED if she has trouble with urinary retention.  I discussed the case with Dr Gaynelle Arabian.  She will need to see Dr Risa Grill in the office on Monday.  Pt is comfortable with this plan.    Dorie Rank, MD 06/15/15 [phone]

## 2015-06-14 NOTE — ED Notes (Signed)
Pt from home c/o hematuria and dysuria x3 days. Pt went to her PCP and was given abx even though her UA and cultures were negative for infection. Pt reports that she continues to have blood in urine with "small clots". Pt reports that she only has 1 kidney d/t kidney cancer in 2000. Pt denies back or flank pain. Pt is A&O and in NAD.

## 2015-06-15 NOTE — Discharge Instructions (Signed)
Return to the ED if you start having trouble urinating.  I spoke with Dr Gaynelle Arabian who is on call for Dr Risa Grill. Make sure to call the office on Monday to be seen that day.

## 2015-06-18 ENCOUNTER — Other Ambulatory Visit: Payer: Self-pay | Admitting: Urology

## 2015-06-18 DIAGNOSIS — C649 Malignant neoplasm of unspecified kidney, except renal pelvis: Secondary | ICD-10-CM

## 2015-06-19 ENCOUNTER — Encounter: Payer: Self-pay | Admitting: Oncology

## 2015-06-26 ENCOUNTER — Ambulatory Visit (HOSPITAL_COMMUNITY)
Admission: RE | Admit: 2015-06-26 | Discharge: 2015-06-26 | Disposition: A | Discharge status: Home / Self Care | Payer: Medicare Other | Source: Ambulatory Visit | Attending: Urology | Admitting: Urology

## 2015-06-26 DIAGNOSIS — C7800 Secondary malignant neoplasm of unspecified lung: Secondary | ICD-10-CM | POA: Insufficient documentation

## 2015-06-26 DIAGNOSIS — I879 Disorder of vein, unspecified: Secondary | ICD-10-CM | POA: Diagnosis not present

## 2015-06-26 DIAGNOSIS — C649 Malignant neoplasm of unspecified kidney, except renal pelvis: Secondary | ICD-10-CM | POA: Diagnosis present

## 2015-06-26 DIAGNOSIS — E279 Disorder of adrenal gland, unspecified: Secondary | ICD-10-CM | POA: Insufficient documentation

## 2015-06-26 LAB — GLUCOSE, CAPILLARY: Glucose-Capillary: 96 mg/dL (ref 65–99)

## 2015-06-26 MED ORDER — FLUDEOXYGLUCOSE F - 18 (FDG) INJECTION
10.8200 | Freq: Once | INTRAVENOUS | Status: AC | PRN
  Administered 2015-06-26: 10.82 via INTRAVENOUS

## 2015-06-27 ENCOUNTER — Other Ambulatory Visit: Payer: Self-pay | Admitting: Urology

## 2015-06-27 ENCOUNTER — Telehealth: Payer: Self-pay | Admitting: Oncology

## 2015-06-27 ENCOUNTER — Other Ambulatory Visit (HOSPITAL_COMMUNITY): Payer: Self-pay | Admitting: Urology

## 2015-06-27 ENCOUNTER — Ambulatory Visit (HOSPITAL_BASED_OUTPATIENT_CLINIC_OR_DEPARTMENT_OTHER): Payer: Medicare Other | Admitting: Oncology

## 2015-06-27 VITALS — BP 150/58 | HR 86 | Temp 97.5°F | Resp 18 | Ht 68.0 in | Wt 234.8 lb

## 2015-06-27 DIAGNOSIS — N289 Disorder of kidney and ureter, unspecified: Secondary | ICD-10-CM | POA: Diagnosis not present

## 2015-06-27 DIAGNOSIS — G8929 Other chronic pain: Secondary | ICD-10-CM

## 2015-06-27 DIAGNOSIS — R918 Other nonspecific abnormal finding of lung field: Secondary | ICD-10-CM

## 2015-06-27 DIAGNOSIS — R109 Unspecified abdominal pain: Secondary | ICD-10-CM

## 2015-06-27 DIAGNOSIS — M545 Low back pain: Secondary | ICD-10-CM

## 2015-06-27 DIAGNOSIS — R911 Solitary pulmonary nodule: Secondary | ICD-10-CM

## 2015-06-27 DIAGNOSIS — M25559 Pain in unspecified hip: Secondary | ICD-10-CM

## 2015-06-27 DIAGNOSIS — E279 Disorder of adrenal gland, unspecified: Secondary | ICD-10-CM

## 2015-06-27 DIAGNOSIS — Z85528 Personal history of other malignant neoplasm of kidney: Secondary | ICD-10-CM

## 2015-06-27 DIAGNOSIS — C649 Malignant neoplasm of unspecified kidney, except renal pelvis: Secondary | ICD-10-CM

## 2015-06-27 DIAGNOSIS — Z87891 Personal history of nicotine dependence: Secondary | ICD-10-CM

## 2015-06-27 DIAGNOSIS — I10 Essential (primary) hypertension: Secondary | ICD-10-CM

## 2015-06-27 MED ORDER — PAZOPANIB HCL 200 MG PO TABS: 800.0000 mg | ORAL_TABLET | Freq: Every day | ORAL | Status: DC

## 2015-06-27 NOTE — Telephone Encounter (Signed)
Gave and printed appt sched and avs fo rpt for May °

## 2015-06-27 NOTE — Progress Notes (Signed)
Please see consult note.  

## 2015-06-27 NOTE — Consult Note (Signed)
Reason for Referral: Kidney cancer.   HPI: This is a pleasant 65 year old woman currently of [place] where she lived the majority of her life. She has history of hypertension and hyperlipidemia and degenerative arthritis of the hip. She had a history of kidney cancer dating back to your 2000 where she presented with a right kidney mass. 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 Her Fuhrman grade was II. She remained disease free until she presented most recently with hematuria and was seen in the emergency department on 06/14/2015. Noncontrast CT scan showed a large 9.5 x 7.9 x 8.5 cm irregular mass in the left kidney. Pulmonary nodules were also noted and degenerative changes of the right hip was also noted. Patient was evaluated by Dr. Tresa Moore from urology and felt that surgical resection is not appropriate at this time. A PET scan was obtained on 06/26/2015 and was reviewed today. She has a large hypermetabolic left renal lesion which consistent with the CT scan finding. She also noted to have hypermetabolic lesions in the lung with the largest of which measuring 2.6 cm in the right lower lobe. Left adrenal nodule was also noted suggestive of metastatic disease as well. Clinically, she is asymptomatic at this point. Her hematuria have resolved. She does not report any urinary symptoms at this time. She does report some flank pain and back pain periodically and does have chronic hip pain which is unchanged. She did lose about 15 pounds unintentionally. She still able to ambulate, drive and attends to activities of daily living.  She does not report any headaches, blurry vision, syncope or seizures. She does not report any fevers, chills, sweats or decline in her energy. She does not report any chest pain, palpitation, orthopnea or leg edema. She does not report any cough, wheezing or hemoptysis. She does not  report any nausea, vomiting, abdominal pain, hematochezia or melena. She does not report any frequency, urgency or hesitancy. She does not report any arthralgias or myalgias. She does not report any lymphadenopathy or petechiae. Remaining review of systems unremarkable.   Past Medical History  Diagnosis Date  . Cancer ([place]) 2000    Kidney  . Diabetes mellitus   . Hypertension   . Hyperlipidemia   . Ulcer   . Blood transfusion   . Allergy   :  Past Surgical History  Procedure Laterality Date  . Abdominal hysterectomy  1987  . Ectopic pregnancy surgery    . 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 Back surgery    :   Current outpatient prescriptions:  .  aspirin 81 MG tablet, Take 81 mg by mouth daily. Reported on 06/14/2015, Disp: , Rfl:  .  atorvastatin (LIPITOR) 80 MG tablet, Take 80 mg by mouth daily., Disp: , Rfl:  .  BYETTA 10 MCG PEN 10 MCG/0.04ML SOPN injection, Inject 10 mg into the skin 2 (two) times daily., Disp: , Rfl:  .  Canagliflozin (INVOKANA) 100 MG TABS, Take 100 mg by mouth daily before breakfast. , Disp: , Rfl:  .  Exenatide (BYETTA 10 MCG PEN [place]), Inject 10 mcg into the skin 2 (two) times daily with a meal., Disp: , Rfl:  .  fluticasone (FLONASE) 50 MCG/ACT nasal spray, Place 2 sprays into the nose daily. (Patient taking differently: Place 2 sprays into the nose daily as needed for allergies. ), Disp: 16 g, Rfl: 6 .  insulin glargine (  LANTUS) 100 UNIT/ML injection, Inject 46 Units into the skin at bedtime. , Disp: , Rfl:  .  losartan-hydrochlorothiazide (HYZAAR) 100-25 MG tablet, Take 1 tablet by mouth daily., Disp: , Rfl:  .  metFORMIN (GLUCOPHAGE) 1000 MG tablet, Take 1,000 mg by mouth 2 (two) times daily with a meal., Disp: , Rfl:  .  traMADol (ULTRAM) 50 MG tablet, Take 50 mg by mouth every 6 (six) hours as needed for moderate pain or severe pain. , Disp: , Rfl: 0 .  pazopanib (VOTRIENT) 200 MG tablet, Take 4 tablets (800 mg total) by mouth daily. Take on an  empty stomach., Disp: 120 tablet, Rfl: 0:  Allergies  Allergen Reactions  . Codeine Rash  :  Family History  Problem Relation Age of Onset  . Colon cancer Neg Hx   . Hypertension Mother   . Hypertension Father   . Diabetes Sister   . Cancer Sister   . Diabetes Brother   . Hypertension Brother   :  Social History   Social History  . Marital Status: Widowed    Spouse Name: N/A  . Number of Children: N/A  . Years of Education: N/A   Occupational History  . Not on file.   Social History Main Topics  . Smoking status: Former Smoker    Quit date: 03/30/2004  . Smokeless tobacco: Not on file  . Alcohol Use: No  . Drug Use: No  . Sexual Activity: No   Other Topics Concern  . Not on file   Social History Narrative   ** Merged History Encounter **      :  Pertinent items are noted in HPI.  Exam: ECOG 1 Blood pressure 150/58, pulse 86, temperature 97.5 F (36.4 C), temperature source Oral, resp. rate 18, height 5\' 8"  (1.727 m), weight 234 lb 12.8 oz (106.505 kg), SpO2 100 %. General appearance: alert and cooperative Head: Normocephalic, without obvious abnormality Throat: lips, mucosa, and tongue normal; teeth and gums normal Neck: no adenopathy Back: negative Resp: clear to auscultation bilaterally Chest wall: no tenderness Cardio: regular rate and rhythm, S1, S2 normal, no murmur, click, rub or gallop GI: soft, non-tender; bowel sounds normal; no masses,  no organomegaly Extremities: extremities normal, atraumatic, no cyanosis or edema Pulses: 2+ and symmetric Skin: Skin color, texture, turgor normal. No rashes or lesions Lymph nodes: Cervical, supraclavicular, and axillary nodes normal.   Ct Abdomen Pelvis Wo Contrast  06/14/2015  CLINICAL DATA:  Acute onset of hematuria and dysuria. Initial encounter. EXAM: CT ABDOMEN AND PELVIS WITHOUT CONTRAST TECHNIQUE: Multidetector CT imaging of the abdomen and pelvis was performed following the standard protocol  without IV contrast. COMPARISON:  Pelvis radiograph performed 01/31/2010 FINDINGS: Multiple prominent nodules are noted throughout the visualized portions of the lungs, measuring up to 2.4 cm in size. These are highly suspicious for metastatic disease. The liver and spleen are unremarkable in appearance. The gallbladder is within normal limits. The pancreas and adrenal glands are unremarkable. The patient is status post right-sided nephrectomy. There appears to be a large 9.5 x 7.9 x 8.5 cm diffusely heterogeneous irregular mass involving much of the posterior left kidney. Additional masses about the left kidney are nonspecific, measuring 3.1 cm and 2.4 cm in size, with surrounding perinephric stranding. Findings are concerning for renal cell carcinoma. No free fluid is identified. The small bowel is unremarkable in appearance. The stomach is within normal limits. No acute vascular abnormalities are seen. Scattered calcification is noted along the abdominal aorta  and its branches. The appendix is normal in caliber, without evidence of appendicitis. Mild diverticulosis is noted along the distal descending and proximal sigmoid colon, without evidence of diverticulitis. Mild stranding is noted along the inferior left paracolic gutter, below the level of the mass. The bladder is moderately distended and grossly unremarkable. The patient is status post hysterectomy. No suspicious adnexal masses are seen. No inguinal lymphadenopathy is seen. No acute osseous abnormalities are identified. Prominent subcortical cystic change is noted at the right hip, with loss of the superior joint space. Mild vacuum phenomenon is noted at the lower lumbar spine. IMPRESSION: 1. Large 9.5 x 7.9 x 8.5 cm diffusely heterogeneous irregular mass involving much of the posterior left kidney, concerning for renal cell carcinoma. Additional masses about the left kidney are nonspecific, measuring 3.1 cm and 2.4 cm in size. 2. Multiple prominent  nodules throughout the visualized portions of the lungs, measuring up to 2.4 cm in size, highly suspicious for metastatic disease. 3. Scattered calcification along the abdominal aorta and its branches. 4. Mild diverticulosis along the distal descending and proximal sigmoid colon, without evidence of diverticulitis. Mild stranding along the inferior left paracolic gutter, below the level of the mass. 5. Degenerative change at the right hip, with loss of the superior joint space and prominent subcortical cystic change. Mild degenerative change at the lower lumbar spine. Electronically Signed   By: Garald Balding M.D.   On: 06/14/2015 23:48   Nm Pet Image Restag (ps) Skull Base To Thigh  06/26/2015  CLINICAL DATA:  Subsequent treatment strategy for renal cell cancer. EXAM: NUCLEAR MEDICINE PET SKULL BASE TO THIGH TECHNIQUE: 10.8 mCi F-18 FDG was injected intravenously. Full-ring PET imaging was performed from the skull base to thigh after the radiotracer. CT data was obtained and used for attenuation correction and anatomic localization. FASTING BLOOD GLUCOSE:  Value: 96 mg/dl COMPARISON:  Abdomen and pelvis CT from 06/14/2015. FINDINGS: NECK No hypermetabolic lymph nodes in the neck. CHEST Multiple bilateral pulmonary nodules are evident. 13 mm nodule in the anterior right upper lobe is hypermetabolic with SUV max = 6.5. 2.6 cm nodule in the right lower lobe (image 81 series 2) is hypermetabolic with SUV max = 0000000. There is hypermetabolic mediastinal lymphadenopathy. 15 mm short axis AP window lymph node has SUV max = 8.6. ABDOMEN/PELVIS No abnormal hypermetabolic activity within the liver, pancreas, adrenal glands, or spleen. 9.5 cm heterogeneous mass in the posterior left kidney is hypermetabolic with SUV max = 14. Other apparent lesions in the right kidney are less definitely hypermetabolic, including the 3.1 cm anterior lesion. Left renal vein is prominent and there is hypermetabolism that tracks along the  left renal vein raising the question of tumor thrombus. 3.3 cm left adrenal mass is evident and there is hypermetabolism in the left adrenal gland with SUV max = 5.3 SKELETON No focal hypermetabolic activity to suggest skeletal metastasis. IMPRESSION: 1. Large hypermetabolic left renal lesion compatible with primary renal cell carcinoma. 2. Hypermetabolism tracking along the course of the left renal vein raises concern for tumor thrombus. 3. Hypermetabolic pulmonary metastases. 4. Hypermetabolic left adrenal nodule, also highly suggestive of metastatic involvement. Electronically Signed   By: Misty Stanley M.D.   On: 06/26/2015 13:42    Assessment and Plan:   65 year old woman with the following issues:  1. Renal mass representing with hypermetabolic lesion measuring 9.5 x 7.9 x 8.5 cm in the posterior left kidney. PET/CT scan showed lesions in the lung, adrenal and possible tumor  thrombus in the left renal vein.  These findings were discussed with the patient and her family and the differential diagnosis was reviewed. In all likelihood we are dealing with metastatic clear cell renal cell histology. Metastatic transitional cell carcinoma is also a possibility but the appearance is not characteristic.  The next step is to obtain tissue biopsy for confirmation purposes. If we're dealing with stage IV renal cell cancer treatment certainly is available and the goal would be palliative. Metastatic transitional cell carcinoma of the renal pelvis would also be treatable however the treatment approach will be different utilizing 2 additional systemic chemotherapy.  Under the assumption a metastatic renal cell carcinoma of discussed the treatment options including oral biological agents, immunotherapy and additional chemotherapy. I think she'll be a good candidate for Votrient therapy which have discussed today in detail. Complications associated with this medication including fatigue, tiredness, weight loss,  diarrhea as well as elevation in her liver function tests. Pending the biopsy results, she would be agreeable to proceed.  2. Liver function prophylaxis: We'll have to monitor her liver function test closely when she starts Votrient.  3. Diarrhea prophylaxis: Instructions how to use Lomotil and Imodium were given to the patient if she develops diarrhea associated with Votrient.  4. Follow-up: Will be in one month to assess her therapy complications.

## 2015-06-27 NOTE — Progress Notes (Signed)
Script for votrient delivered to lisa, who is sitting in for chris elder today. Do not process, until results of upcoming biopsy back. Information packet on votrient given to patient and her sister, Lelan Pons. gave mine and stacey's name, on dr [employer] card. with a copy of new phone tree card.

## 2015-07-02 ENCOUNTER — Encounter: Payer: Self-pay | Admitting: Oncology

## 2015-07-02 NOTE — Progress Notes (Signed)
Applied w/ [employer] for copay assistance for Votrient.  Pt is approved from 07/02/15 to 06/30/16 for $11,000.

## 2015-07-03 ENCOUNTER — Other Ambulatory Visit: Payer: Self-pay | Admitting: General Surgery

## 2015-07-03 ENCOUNTER — Other Ambulatory Visit: Payer: Self-pay | Admitting: Radiology

## 2015-07-04 ENCOUNTER — Ambulatory Visit (HOSPITAL_COMMUNITY)
Admission: RE | Admit: 2015-07-04 | Discharge: 2015-07-04 | Disposition: A | Discharge status: Home / Self Care | Payer: Medicare Other | Source: Ambulatory Visit | Attending: Oncology | Admitting: Oncology

## 2015-07-04 ENCOUNTER — Encounter (HOSPITAL_COMMUNITY): Payer: Self-pay

## 2015-07-04 DIAGNOSIS — Z79899 Other long term (current) drug therapy: Secondary | ICD-10-CM | POA: Diagnosis not present

## 2015-07-04 DIAGNOSIS — N2889 Other specified disorders of kidney and ureter: Secondary | ICD-10-CM | POA: Diagnosis present

## 2015-07-04 DIAGNOSIS — Z85528 Personal history of other malignant neoplasm of kidney: Secondary | ICD-10-CM | POA: Insufficient documentation

## 2015-07-04 DIAGNOSIS — Z87891 Personal history of nicotine dependence: Secondary | ICD-10-CM | POA: Insufficient documentation

## 2015-07-04 DIAGNOSIS — C642 Malignant neoplasm of left kidney, except renal pelvis: Secondary | ICD-10-CM | POA: Diagnosis not present

## 2015-07-04 DIAGNOSIS — Z833 Family history of diabetes mellitus: Secondary | ICD-10-CM | POA: Diagnosis not present

## 2015-07-04 DIAGNOSIS — Z885 Allergy status to narcotic agent status: Secondary | ICD-10-CM | POA: Insufficient documentation

## 2015-07-04 DIAGNOSIS — Z794 Long term (current) use of insulin: Secondary | ICD-10-CM | POA: Diagnosis not present

## 2015-07-04 DIAGNOSIS — Z905 Acquired absence of kidney: Secondary | ICD-10-CM | POA: Diagnosis not present

## 2015-07-04 DIAGNOSIS — E119 Type 2 diabetes mellitus without complications: Secondary | ICD-10-CM | POA: Diagnosis not present

## 2015-07-04 DIAGNOSIS — E785 Hyperlipidemia, unspecified: Secondary | ICD-10-CM | POA: Diagnosis not present

## 2015-07-04 DIAGNOSIS — Z7982 Long term (current) use of aspirin: Secondary | ICD-10-CM | POA: Insufficient documentation

## 2015-07-04 DIAGNOSIS — E279 Disorder of adrenal gland, unspecified: Secondary | ICD-10-CM | POA: Diagnosis not present

## 2015-07-04 DIAGNOSIS — Z8249 Family history of ischemic heart disease and other diseases of the circulatory system: Secondary | ICD-10-CM | POA: Insufficient documentation

## 2015-07-04 DIAGNOSIS — I10 Essential (primary) hypertension: Secondary | ICD-10-CM | POA: Insufficient documentation

## 2015-07-04 DIAGNOSIS — C649 Malignant neoplasm of unspecified kidney, except renal pelvis: Secondary | ICD-10-CM

## 2015-07-04 LAB — APTT: aPTT: 33 s (ref 24–37)

## 2015-07-04 LAB — CBC
HCT: 36.1 % (ref 36.0–46.0)
Hemoglobin: 11.2 g/dL — ABNORMAL LOW (ref 12.0–15.0)
MCH: 27 pg (ref 26.0–34.0)
MCHC: 31 g/dL (ref 30.0–36.0)
MCV: 87 fL (ref 78.0–100.0)
Platelets: 435 10*3/uL — ABNORMAL HIGH (ref 150–400)
RBC: 4.15 MIL/uL (ref 3.87–5.11)
RDW: 15.7 % — ABNORMAL HIGH (ref 11.5–15.5)
WBC: 10.6 10*3/uL — ABNORMAL HIGH (ref 4.0–10.5)

## 2015-07-04 LAB — GLUCOSE, CAPILLARY: GLUCOSE-CAPILLARY: 115 mg/dL — AB (ref 65–99)

## 2015-07-04 LAB — PROTIME-INR
INR: 1.04 (ref 0.00–1.49)
PROTHROMBIN TIME: 13.4 s (ref 11.6–15.2)

## 2015-07-04 MED ORDER — FENTANYL CITRATE (PF) 100 MCG/2ML IJ SOLN
INTRAMUSCULAR | Status: AC | PRN
  Administered 2015-07-04: 50 ug via INTRAVENOUS

## 2015-07-04 MED ORDER — FENTANYL CITRATE (PF) 100 MCG/2ML IJ SOLN
INTRAMUSCULAR | Status: AC
  Filled 2015-07-04: qty 4

## 2015-07-04 MED ORDER — MIDAZOLAM HCL 2 MG/2ML IJ SOLN
INTRAMUSCULAR | Status: AC
  Filled 2015-07-04: qty 6

## 2015-07-04 MED ORDER — MIDAZOLAM HCL 2 MG/2ML IJ SOLN
INTRAMUSCULAR | Status: AC | PRN
  Administered 2015-07-04 (×4): 1 mg via INTRAVENOUS

## 2015-07-04 MED ORDER — SODIUM CHLORIDE 0.9 % IV SOLN
INTRAVENOUS | Status: DC
  Administered 2015-07-04: 07:00:00 via INTRAVENOUS

## 2015-07-04 NOTE — Sedation Documentation (Signed)
Patient denies pain and is resting comfortably.  

## 2015-07-04 NOTE — Procedures (Signed)
Successful CT bx left renal mass No comp Stable Full report in PACS

## 2015-07-04 NOTE — H&P (Signed)
Chief Complaint: Patient was seen in consultation today for CT guided left adrenal mass biopsy  Referring Physician(s): Wyatt Portela  Supervising Physician: Daryll Brod  History of Present Illness: Laura Neal is a 65 y.o. female with past medical history significant for clear cell renal cell carcinoma of the right kidney, status post right nephrectomy in 2000 , diabetes, hypertension, and hyperlipidemia. Due to recent hematuria and dysuria the patient underwent CT abdomen and pelvis on 06/14/15 and PET scan on 06/26/15 which revealed a large hypermetabolic left renal lesion as well as possible left renal vein tumor thrombus, lung and left adrenal nodules suspicious for metastatic involvement. She presents today for CT-guided left adrenal mass biopsy for further evaluation.  Past Medical History  Diagnosis Date  . Cancer ([place]) 2000    Kidney  . Diabetes mellitus   . Hypertension   . Hyperlipidemia   . Ulcer   . Blood transfusion   . Allergy     Past Surgical History  Procedure Laterality Date  . Abdominal hysterectomy  1987  . Ectopic pregnancy surgery    . 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 Back surgery      Allergies: Codeine  Medications: Prior to Admission medications   Medication Sig Start Date End Date Taking? Authorizing Provider  aspirin 81 MG tablet Take 81 mg by mouth daily. Reported on 06/14/2015   Yes Historical Provider, MD  atorvastatin (LIPITOR) 80 MG tablet Take 80 mg by mouth daily. 03/21/15  Yes Historical Provider, MD  BYETTA 10 MCG PEN 10 MCG/0.04ML SOPN injection Inject 10 mg into the skin 2 (two) times daily. 05/13/15  Yes Historical Provider, MD  Canagliflozin (INVOKANA) 100 MG TABS Take 100 mg by mouth daily before breakfast.    Yes Historical Provider, MD  Exenatide (BYETTA 10 MCG PEN [place]) Inject 10 mcg into the skin 2 (two) times daily with a meal.   Yes Historical Provider, MD  insulin glargine (LANTUS) 100 UNIT/ML injection Inject  46 Units into the skin at bedtime.    Yes Historical Provider, MD  losartan-hydrochlorothiazide (HYZAAR) 100-25 MG tablet Take 1 tablet by mouth daily. 05/07/15  Yes Historical Provider, MD  metFORMIN (GLUCOPHAGE) 1000 MG tablet Take 1,000 mg by mouth 2 (two) times daily with a meal.   Yes Historical Provider, MD  traMADol (ULTRAM) 50 MG tablet Take 50 mg by mouth every 6 (six) hours as needed for moderate pain or severe pain.  05/22/15  Yes Historical Provider, MD  fluticasone (FLONASE) 50 MCG/ACT nasal spray Place 2 sprays into the nose daily. Patient taking differently: Place 2 sprays into the nose daily as needed for allergies.  11/26/12   Robyn Haber, MD  pazopanib (VOTRIENT) 200 MG tablet Take 4 tablets (800 mg total) by mouth daily. Take on an empty stomach. 06/27/15   Wyatt Portela, MD     Family History  Problem Relation Age of Onset  . Colon cancer Neg Hx   . Hypertension Mother   . Hypertension Father   . Diabetes Sister   . Cancer Sister   . Diabetes Brother   . Hypertension Brother     Social History   Social History  . Marital Status: Widowed    Spouse Name: N/A  . Number of Children: N/A  . Years of Education: N/A   Social History Main Topics  . Smoking status: Former Smoker    Quit date: 03/30/2004  . Smokeless tobacco: None  .  Alcohol Use: No  . Drug Use: No  . Sexual Activity: No   Other Topics Concern  . None   Social History Narrative   ** Merged History Encounter **          Review of Systems  Constitutional: Positive for unexpected weight change. Negative for fever and chills.  Respiratory: Positive for cough. Negative for choking and shortness of breath.   Cardiovascular: Negative for chest pain.  Gastrointestinal: Positive for abdominal pain. Negative for nausea, vomiting and blood in stool.  Genitourinary: Positive for dysuria and hematuria.  Musculoskeletal: Positive for back pain.       Occ rt hip pain  Neurological: Negative for  headaches.    Vital Signs: BP 134/75 mmHg  Pulse 81  Temp(Src) 97.9 F (36.6 C) (Oral)  Resp 18  SpO2 99%  Physical Exam  Constitutional: She is oriented to person, place, and time. She appears well-developed and well-nourished.  Cardiovascular: Normal rate and regular rhythm.   Pulmonary/Chest: Effort normal.  Sl dim BS bases  Abdominal: Soft. Bowel sounds are normal. There is no tenderness.  Musculoskeletal: Normal range of motion. She exhibits no edema.  Neurological: She is alert and oriented to person, place, and time.    Mallampati Score:  MD Evaluation Airway: WNL Heart: WNL Abdomen: WNL Chest/ Lungs: WNL ASA  Classification: 2 Mallampati/Airway Score: Two  Imaging: Ct Abdomen Pelvis Wo Contrast  06/14/2015  CLINICAL DATA:  Acute onset of hematuria and dysuria. Initial encounter. EXAM: CT ABDOMEN AND PELVIS WITHOUT CONTRAST TECHNIQUE: Multidetector CT imaging of the abdomen and pelvis was performed following the standard protocol without IV contrast. COMPARISON:  Pelvis radiograph performed 01/31/2010 FINDINGS: Multiple prominent nodules are noted throughout the visualized portions of the lungs, measuring up to 2.4 cm in size. These are highly suspicious for metastatic disease. The liver and spleen are unremarkable in appearance. The gallbladder is within normal limits. The pancreas and adrenal glands are unremarkable. The patient is status post right-sided nephrectomy. There appears to be a large 9.5 x 7.9 x 8.5 cm diffusely heterogeneous irregular mass involving much of the posterior left kidney. Additional masses about the left kidney are nonspecific, measuring 3.1 cm and 2.4 cm in size, with surrounding perinephric stranding. Findings are concerning for renal cell carcinoma. No free fluid is identified. The small bowel is unremarkable in appearance. The stomach is within normal limits. No acute vascular abnormalities are seen. Scattered calcification is noted along the  abdominal aorta and its branches. The appendix is normal in caliber, without evidence of appendicitis. Mild diverticulosis is noted along the distal descending and proximal sigmoid colon, without evidence of diverticulitis. Mild stranding is noted along the inferior left paracolic gutter, below the level of the mass. The bladder is moderately distended and grossly unremarkable. The patient is status post hysterectomy. No suspicious adnexal masses are seen. No inguinal lymphadenopathy is seen. No acute osseous abnormalities are identified. Prominent subcortical cystic change is noted at the right hip, with loss of the superior joint space. Mild vacuum phenomenon is noted at the lower lumbar spine. IMPRESSION: 1. Large 9.5 x 7.9 x 8.5 cm diffusely heterogeneous irregular mass involving much of the posterior left kidney, concerning for renal cell carcinoma. Additional masses about the left kidney are nonspecific, measuring 3.1 cm and 2.4 cm in size. 2. Multiple prominent nodules throughout the visualized portions of the lungs, measuring up to 2.4 cm in size, highly suspicious for metastatic disease. 3. Scattered calcification along the abdominal aorta and  its branches. 4. Mild diverticulosis along the distal descending and proximal sigmoid colon, without evidence of diverticulitis. Mild stranding along the inferior left paracolic gutter, below the level of the mass. 5. Degenerative change at the right hip, with loss of the superior joint space and prominent subcortical cystic change. Mild degenerative change at the lower lumbar spine. Electronically Signed   By: Garald Balding M.D.   On: 06/14/2015 23:48   Nm Pet Image Restag (ps) Skull Base To Thigh  06/26/2015  CLINICAL DATA:  Subsequent treatment strategy for renal cell cancer. EXAM: NUCLEAR MEDICINE PET SKULL BASE TO THIGH TECHNIQUE: 10.8 mCi F-18 FDG was injected intravenously. Full-ring PET imaging was performed from the skull base to thigh after the  radiotracer. CT data was obtained and used for attenuation correction and anatomic localization. FASTING BLOOD GLUCOSE:  Value: 96 mg/dl COMPARISON:  Abdomen and pelvis CT from 06/14/2015. FINDINGS: NECK No hypermetabolic lymph nodes in the neck. CHEST Multiple bilateral pulmonary nodules are evident. 13 mm nodule in the anterior right upper lobe is hypermetabolic with SUV max = 6.5. 2.6 cm nodule in the right lower lobe (image 81 series 2) is hypermetabolic with SUV max = 0000000. There is hypermetabolic mediastinal lymphadenopathy. 15 mm short axis AP window lymph node has SUV max = 8.6. ABDOMEN/PELVIS No abnormal hypermetabolic activity within the liver, pancreas, adrenal glands, or spleen. 9.5 cm heterogeneous mass in the posterior left kidney is hypermetabolic with SUV max = 14. Other apparent lesions in the right kidney are less definitely hypermetabolic, including the 3.1 cm anterior lesion. Left renal vein is prominent and there is hypermetabolism that tracks along the left renal vein raising the question of tumor thrombus. 3.3 cm left adrenal mass is evident and there is hypermetabolism in the left adrenal gland with SUV max = 5.3 SKELETON No focal hypermetabolic activity to suggest skeletal metastasis. IMPRESSION: 1. Large hypermetabolic left renal lesion compatible with primary renal cell carcinoma. 2. Hypermetabolism tracking along the course of the left renal vein raises concern for tumor thrombus. 3. Hypermetabolic pulmonary metastases. 4. Hypermetabolic left adrenal nodule, also highly suggestive of metastatic involvement. Electronically Signed   By: Misty Stanley M.D.   On: 06/26/2015 13:42    Labs:  CBC:  Recent Labs  06/14/15 1851 07/04/15 0725  WBC 12.6* 10.6*  HGB 11.3* 11.2*  HCT 35.3* 36.1  PLT 533* 435*    COAGS:  Recent Labs  07/04/15 0725  INR 1.04  APTT 33    BMP:  Recent Labs  06/14/15 1851  NA 141  K 3.9  CL 104  CO2 27  GLUCOSE 143*  BUN 13  CALCIUM 9.3    CREATININE 1.65*  GFRNONAA 31*  GFRAA 36*    LIVER FUNCTION TESTS: No results for input(s): BILITOT, AST, ALT, ALKPHOS, PROT, ALBUMIN in the last 8760 hours.  TUMOR MARKERS: No results for input(s): AFPTM, CEA, CA199, CHROMGRNA in the last 8760 hours.  Assessment and Plan: 65 y.o. female with past medical history significant for clear cell renal cell carcinoma of the right kidney, status post right nephrectomy in 2000 , diabetes, hypertension, and hyperlipidemia. Due to recent hematuria and dysuria the patient underwent CT abdomen and pelvis on 06/14/15 and PET scan on 06/26/15 which revealed a large hypermetabolic left renal lesion as well as possible left renal vein tumor thrombus, lung and left adrenal nodules suspicious for metastatic involvement. She presents today for CT-guided left adrenal mass biopsy for further evaluation.Risks and benefits discussed with the  patient including, but not limited to bleeding, infection, damage to adjacent structures or low yield requiring additional tests.All of the patient's questions were answered, patient is agreeable to proceed.Consent signed and in chart.    Thank you for this interesting consult.  I greatly enjoyed meeting Laura Neal and look forward to participating in their care.  A copy of this report was sent to the requesting provider on this date.  Electronically Signed: D. Rowe Robert 07/04/2015, 8:29 AM   I spent a total of 20 minutes  in face to face in clinical consultation, greater than 50% of which was counseling/coordinating care for CT guided left adrenal mass biopsy

## 2015-07-04 NOTE — Discharge Instructions (Signed)
Needle Biopsy, Care After °These instructions give you information about caring for yourself after your procedure. Your doctor may also give you more specific instructions. Call your doctor if you have any problems or questions after your procedure. °HOME CARE °· Rest as told by your doctor. °· Take medicines only as told by your doctor. °· There are many different ways to close and cover the biopsy site, including stitches (sutures), skin glue, and adhesive strips. Follow instructions from your doctor about: °¨ How to take care of your biopsy site. °¨ When and how you should change your bandage (dressing). °¨ When you should remove your dressing. °¨ Removing whatever was used to close your biopsy site. °· Check your biopsy site every day for signs of infection. Watch for: °¨ Redness, swelling, or pain. °¨ Fluid, blood, or pus. °GET HELP IF: °· You have a fever. °· You have redness, swelling, or pain at the biopsy site, and it lasts longer than a few days. °· You have fluid, blood, or pus coming from the biopsy site. °· You feel sick to your stomach (nauseous). °· You throw up (vomit). °GET HELP RIGHT AWAY IF: °· You are short of breath. °· You have trouble breathing. °· Your chest hurts. °· You feel dizzy or you pass out (faint). °· You have bleeding that does not stop with pressure or a bandage. °· You cough up blood. °· Your belly (abdomen) hurts. °  °This information is not intended to replace advice given to you by your health care provider. Make sure you discuss any questions you have with your health care provider. °  °Document Released: 02/27/2008 Document Revised: 07/31/2014 Document Reviewed: 03/12/2014 °Elsevier Interactive Patient Education ©2016 Elsevier Inc. ° ° ° °Moderate Conscious Sedation, Adult °Sedation is the use of medicines to promote relaxation and relieve discomfort and anxiety. Moderate conscious sedation is a type of sedation. Under moderate conscious sedation you are less alert than  normal but are still able to respond to instructions or stimulation. Moderate conscious sedation is used during short medical and dental procedures. It is milder than deep sedation or general anesthesia and allows you to return to your regular activities sooner. °LET YOUR HEALTH CARE PROVIDER KNOW ABOUT:  °· Any allergies you have. °· All medicines you are taking, including vitamins, herbs, eye drops, creams, and over-the-counter medicines. °· Use of steroids (by mouth or creams). °· Previous problems you or members of your family have had with the use of anesthetics. °· Any blood disorders you have. °· Previous surgeries you have had. °· Medical conditions you have. °· Possibility of pregnancy, if this applies. °· Use of cigarettes, alcohol, or illegal drugs. °RISKS AND COMPLICATIONS °Generally, this is a safe procedure. However, as with any procedure, problems can occur. Possible problems include: °· Oversedation. °· Trouble breathing on your own. You may need to have a breathing tube until you are awake and breathing on your own. °· Allergic reaction to any of the medicines used for the procedure. °BEFORE THE PROCEDURE °· You may have blood tests done. These tests can help show how well your kidneys and liver are working. They can also show how well your blood clots. °· A physical exam will be done.   °· Only take medicines as directed by your health care provider. You may need to stop taking medicines (such as blood thinners, aspirin, or nonsteroidal anti-inflammatory drugs) before the procedure.   °· Do not eat or drink at least 6 hours before the procedure or   as directed by your health care provider. °· Arrange for a responsible adult, family member, or friend to take you home after the procedure. He or she should stay with you for at least 24 hours after the procedure, until the medicine has worn off. °PROCEDURE  °· An intravenous (IV) catheter will be inserted into one of your veins. Medicine will be able to  flow directly into your body through this catheter. You may be given medicine through this tube to help prevent pain and help you relax. °· The medical or dental procedure will be done. °AFTER THE PROCEDURE °· You will stay in a recovery area until the medicine has worn off. Your blood pressure and pulse will be checked.   °·  Depending on the procedure you had, you may be allowed to go home when you can tolerate liquids and your pain is under control. °  °This information is not intended to replace advice given to you by your health care provider. Make sure you discuss any questions you have with your health care provider. °  °Document Released: 12/09/2000 Document Revised: 04/06/2014 Document Reviewed: 11/21/2012 °Elsevier Interactive Patient Education ©2016 Elsevier Inc. ° °

## 2015-07-10 ENCOUNTER — Encounter: Payer: Self-pay | Admitting: Pharmacist

## 2015-07-10 ENCOUNTER — Telehealth: Payer: Self-pay | Admitting: Pharmacist

## 2015-07-10 MED FILL — *VOTRIENT 200 MG TABLET: 200 | 30 days supply | Qty: 120 | Fill #0

## 2015-07-10 NOTE — Telephone Encounter (Signed)
07/10/15: Spoke with Laura Neal, medical assistant, at Dr. Chales Abrahams office regarding drug interaction with Votrient and Lipitor. Per recommendation from Dr. Coletta Memos, Laura Neal will D/C Lipitor at this time. No need for a statin per Dr. Coletta Memos. Primary care will re-evaluate and add crestor if needed in the future.    Thank you,  Montel Clock, PharmD, [facility]

## 2015-07-10 NOTE — Progress Notes (Signed)
Oral Chemotherapy Pharmacist Encounter   I spoke with patient for overview of new oral chemotherapy medication: Votrient. Pt is doing well. 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 The prescription for votrient has been sent to the [facility] for benefit analysis and approval. Prior auth approved and Laura Neal has been signed up for [employer] for PANF:  ID: [id number] RxGroup: [id number] RxBin: [id number]  Counseled patient on administration, dosing, side effects, safe handling, and monitoring. Side effects include but not limited to: Fatigue, nausea, diarrhea, high blood pressure, increased liver enzymes, hair discoloration and loss of appetite.  Laura Neal voiced understanding and appreciation. She knows to take the medication on an empty stomach at least 1 hour before or 2 hours after food.   Laura Neal is on Lipitor 80 mg daily for cholesterol management. This medication interacts with Votrient and is a category X interaction. Lipitor is prescribed by Dr. Coletta Memos her primary care physician (tel: [phone]). Laura Neal is aware of the interaction and will stop Lipitor when she starts votrient. I will call Dr. Chales Abrahams office to inform them and request change to Crestor, which does not interact with Votrient.   All questions answered.  Will follow up with patient lipitor/crestor change and pharmacy. Will follow up in 1-2 weeks for adherence and toxicity management.   Thank you,  Montel Clock, PharmD, [facility]

## 2015-07-10 NOTE — Telephone Encounter (Signed)
07/10/15: New Rx for Votrient sent to [facility]. Prior Auth Approved through [employer]. [id number]. Patient already approved for PANF grant $11,000. Will follow up with patient for counseling.

## 2015-07-19 ENCOUNTER — Telehealth: Payer: Self-pay | Admitting: Pharmacist

## 2015-07-19 NOTE — Telephone Encounter (Signed)
07/19/15: Attempted to reach patient for follow up on oral medication: Votrient. No answer. Left VM for patient to call back with any questions or issues.   Thank you,  Chris Pecola Haxton, PharmD, [facility] [phone] 

## 2015-07-23 ENCOUNTER — Other Ambulatory Visit: Payer: Self-pay | Admitting: *Deleted

## 2015-07-23 ENCOUNTER — Encounter: Payer: Self-pay | Admitting: *Deleted

## 2015-07-23 ENCOUNTER — Encounter: Payer: Self-pay | Admitting: Pharmacist

## 2015-07-23 MED ORDER — PROCHLORPERAZINE MALEATE 10 MG PO TABS: 10.0000 mg | ORAL_TABLET | Freq: Four times a day (QID) | ORAL | Status: DC | PRN

## 2015-07-23 NOTE — Progress Notes (Signed)
Oral Chemotherapy Follow-Up Form  Original Start date of oral chemotherapy: approximately 07/16/15    Called patient today to follow up regarding patient's oral chemotherapy medication: Votrient  Ms Uhles called today stating that she has no appetite.  She states that she is somewhat nauseated.  No other side effects to report.  Ms Reinsel has nothing for N/V at home.  Dixie, RN will speak to Dr Alen Blew and call [facility] for Ms Lear.  ([facility]chlorperazine [facility] into [facility])  Pt reports 0 tablets/doses missed in the last week/month.     Will follow up and call patient again in 2 weeks   Thank you,  Carolynne Edouard, PharmD [facility]

## 2015-07-30 ENCOUNTER — Telehealth: Payer: Self-pay | Admitting: Oncology

## 2015-07-30 ENCOUNTER — Ambulatory Visit (HOSPITAL_BASED_OUTPATIENT_CLINIC_OR_DEPARTMENT_OTHER): Payer: Medicare Other | Admitting: Oncology

## 2015-07-30 ENCOUNTER — Other Ambulatory Visit (HOSPITAL_BASED_OUTPATIENT_CLINIC_OR_DEPARTMENT_OTHER): Payer: Medicare Other

## 2015-07-30 VITALS — BP 167/76 | HR 91 | Temp 97.7°F | Resp 18 | Wt 227.1 lb

## 2015-07-30 DIAGNOSIS — C642 Malignant neoplasm of left kidney, except renal pelvis: Secondary | ICD-10-CM

## 2015-07-30 DIAGNOSIS — N289 Disorder of kidney and ureter, unspecified: Secondary | ICD-10-CM

## 2015-07-30 DIAGNOSIS — E279 Disorder of adrenal gland, unspecified: Secondary | ICD-10-CM

## 2015-07-30 DIAGNOSIS — J984 Other disorders of lung: Secondary | ICD-10-CM | POA: Diagnosis not present

## 2015-07-30 DIAGNOSIS — C649 Malignant neoplasm of unspecified kidney, except renal pelvis: Secondary | ICD-10-CM

## 2015-07-30 LAB — COMPREHENSIVE METABOLIC PANEL
ALK PHOS: 137 U/L (ref 40–150)
ALT: 15 U/L (ref 0–55)
AST: 22 U/L (ref 5–34)
Albumin: 3.1 g/dL — ABNORMAL LOW (ref 3.5–5.0)
Anion Gap: 11 mEq/L (ref 3–11)
BILIRUBIN TOTAL: 0.38 mg/dL (ref 0.20–1.20)
BUN: 15.2 mg/dL (ref 7.0–26.0)
CHLORIDE: 105 meq/L (ref 98–109)
CO2: 23 meq/L (ref 22–29)
Calcium: 9.4 mg/dL (ref 8.4–10.4)
Creatinine: 1.9 mg/dL — ABNORMAL HIGH (ref 0.6–1.1)
EGFR: 30 mL/min/{1.73_m2} — ABNORMAL LOW (ref >90)
Glucose: 192 mg/dl — ABNORMAL HIGH (ref 70–140)
Potassium: 4.1 mEq/L (ref 3.5–5.1)
SODIUM: 140 meq/L (ref 136–145)
Total Protein: 7.9 g/dL (ref 6.4–8.3)

## 2015-07-30 LAB — CBC WITH DIFFERENTIAL/PLATELET
BASO%: 0.9 % (ref 0.0–2.0)
Basophils Absolute: 0.1 10*3/uL (ref 0.0–0.1)
EOS%: 0.8 % (ref 0.0–7.0)
Eosinophils Absolute: 0.1 10*3/uL (ref 0.0–0.5)
HCT: 37.7 % (ref 34.8–46.6)
HGB: 12.1 g/dL (ref 11.6–15.9)
LYMPH#: 2.2 10*3/uL (ref 0.9–3.3)
LYMPH%: 27.6 % (ref 14.0–49.7)
MCH: 27.4 pg (ref 25.1–34.0)
MCHC: 32.2 g/dL (ref 31.5–36.0)
MCV: 85.3 fL (ref 79.5–101.0)
MONO#: 0.4 10*3/uL (ref 0.1–0.9)
MONO%: 4.6 % (ref 0.0–14.0)
NEUT#: 5.3 10*3/uL (ref 1.5–6.5)
NEUT%: 66.1 % (ref 38.4–76.8)
Platelets: 400 10*3/uL (ref 145–400)
RBC: 4.42 10*6/uL (ref 3.70–5.45)
RDW: 16.3 % — ABNORMAL HIGH (ref 11.2–14.5)
WBC: 8.1 10*3/uL (ref 3.9–10.3)

## 2015-07-30 NOTE — Progress Notes (Signed)
Hematology and Oncology Follow Up Visit  Laura Neal [id number] Aug 26, 1950 65 y.o. 07/30/2015 12:28 PM [name], MD   Principle Diagnosis: 65 year old woman with renal cell carcinoma. She presented with a 9.5 x 7.9 x 8.5 cm mass in the left kidney in March 2017. PET CT scan showed metastatic disease to the lung as well as the adrenal glands. This is biopsy proven to be renal cell carcinoma.   Prior Therapy: She is status post biopsy of her kidney mass which confirmed the presence of renal cell carcinoma on 07/04/2015.  Current therapy: Votrient 800 mg daily started on 07/16/2015.  Interim History: Laura Neal presents today for a follow-up visit. Since the last visit, she underwent tissue biopsy of her renal mass which showed the presence of clear cell histology. She tolerated the procedure well and started on Votrient to treatment on 07/16/2015. She has tolerated this therapy fairly well without any major complications so far. She did report some mild nausea but no vomiting. She does take Compazine which have helped her symptoms. She did have grade 1 fatigue but no diarrhea. She did report some decline in her appetite and lost about 6 pounds. Otherwise her quality of life and performance status remains excellent. She is denying any back pain or flank pain. She denied any hematuria.  She does not report any headaches, blurry vision, syncope or seizures. She does not report any fevers, chills, sweats or decline in her energy. She does not report any chest pain, palpitation, orthopnea or leg edema. She does not report any cough, wheezing or hemoptysis. She does not report any nausea, vomiting, abdominal pain, hematochezia or melena. She does not report any frequency, urgency or hesitancy. She does not report any arthralgias or myalgias. She does not report any lymphadenopathy or petechiae. Remaining review of systems unremarkable.    Medications: I have reviewed the patient's  current medications.  Current Outpatient Prescriptions  Medication Sig Dispense Refill  . aspirin 81 MG tablet Take 81 mg by mouth daily. Reported on 06/14/2015    . atorvastatin (LIPITOR) 80 MG tablet Take 80 mg by mouth daily.    [name] BYETTA 10 MCG PEN 10 MCG/0.04ML SOPN injection Inject 10 mg into the skin 2 (two) times daily.    . Canagliflozin (INVOKANA) 100 MG TABS Take 100 mg by mouth daily before breakfast.     . Exenatide (BYETTA 10 MCG PEN [employer]) Inject 10 mcg into the skin 2 (two) times daily with a meal.    . fluticasone (FLONASE) 50 MCG/ACT nasal spray Place 2 sprays into the nose daily. (Patient taking differently: Place 2 sprays into the nose daily as needed for allergies. ) 16 g 6  . insulin glargine (LANTUS) 100 UNIT/ML injection Inject 46 Units into the skin at bedtime.     [name] losartan-hydrochlorothiazide (HYZAAR) 100-25 MG tablet Take 1 tablet by mouth daily.    . metFORMIN (GLUCOPHAGE) 1000 MG tablet Take 1,000 mg by mouth 2 (two) times daily with a meal.    . pazopanib (VOTRIENT) 200 MG tablet Take 4 tablets (800 mg total) by mouth daily. Take on an empty stomach. 120 tablet 0  . prochlorperazine (COMPAZINE) 10 MG tablet Take 1 tablet (10 mg total) by mouth every 6 (six) hours as needed for nausea or vomiting. 30 tablet 1  . traMADol (ULTRAM) 50 MG tablet Take 50 mg by mouth every 6 (six) hours as needed for moderate pain or severe pain.   0  No current facility-administered medications for this visit.     Allergies:  Allergies  Allergen Reactions  . Codeine Rash    Past Medical History, Surgical history, Social history, and Family History were reviewed and updated.  [name] Physical Exam: Blood pressure 167/76, pulse 91, temperature 97.7 F (36.5 C), temperature source Oral, resp. rate 18, weight 227 lb 1.6 oz (103.012 kg), SpO2 98 %. ECOG: 0 General appearance: alert and cooperative appeared without distress. Head: Normocephalic, without obvious abnormality no oral ulcers  or lesions. Neck: no adenopathy Lymph nodes: Cervical, supraclavicular, and axillary nodes normal. Heart:regular rate and rhythm, S1, S2 normal, no murmur, click, rub or gallop Lung:chest clear, no wheezing, rales, normal symmetric air entry Abdomin: soft, non-tender, without masses or organomegaly shifting dullness or ascites. EXT:no erythema, induration, or nodules   Lab Results: Lab Results  Component Value Date   WBC 8.1 07/30/2015   HGB 12.1 07/30/2015   HCT 37.7 07/30/2015   MCV 85.3 07/30/2015   PLT 400 07/30/2015     Chemistry      Component Value Date/Time   NA 140 07/30/2015 1139   NA 141 06/14/2015 1851   K 4.1 07/30/2015 1139   K 3.9 06/14/2015 1851   CL 104 06/14/2015 1851   CO2 23 07/30/2015 1139   CO2 27 06/14/2015 1851   BUN 15.2 07/30/2015 1139   BUN 13 06/14/2015 1851   CREATININE 1.9* 07/30/2015 1139   CREATININE 1.65* 06/14/2015 1851   CREATININE 1.22* 08/24/2013 1654      Component Value Date/Time   CALCIUM 9.4 07/30/2015 1139   CALCIUM 9.3 06/14/2015 1851   ALKPHOS 137 07/30/2015 1139   ALKPHOS 74 08/24/2013 1654   AST 22 07/30/2015 1139   AST 18 08/24/2013 1654   ALT 15 07/30/2015 1139   ALT 15 08/24/2013 1654   BILITOT 0.38 07/30/2015 1139   BILITOT 0.3 08/24/2013 1654       Impression and Plan:  65 year old woman with the following issues:  1. Renal mass representing with hypermetabolic lesion measuring 9.5 x 7.9 x 8.5 cm in the posterior left kidney. PET/CT scan showed lesions in the lung, adrenal and possible tumor thrombus in the left renal vein.  She is status post biopsy on 07/04/2015. The biopsy confirmed the presence of renal cell carcinoma.  She started on Votrient at 800 milligrams daily and have tolerated it well so far. She did report some mild fatigue and nausea that have been manageable. She lost close to 5 pounds as well. Overall the side effects are manageable and the plan is to continue with the same dose and schedule.  We'll repeat imaging studies in 2 months. She understands the goal of this treatment is palliative and is not curative at this time.  2. Liver function surveillance: Her LFTs are all within normal range and we'll continue to monitor monthly.  3. Diarrhea prophylaxis: She has Imodium with instructions how to use it if she develops any diarrhea.  4. Renal insufficiency: Creatinine is around 1.9 which is slightly worse but no other electrolyte imbalance. We'll continue to monitor this moving forward.  5. Follow-up: Will be in one month to assess for any complications.   Zola Button, MD 5/2/201712:28 PM

## 2015-07-30 NOTE — Telephone Encounter (Signed)
Gave and printed appt sched and avs for pt for may °

## 2015-08-01 ENCOUNTER — Ambulatory Visit: Payer: Self-pay | Admitting: Orthopedic Surgery

## 2015-08-01 NOTE — Progress Notes (Signed)
Preoperative surgical orders have been place into the Epic hospital system for Laura Neal on 08/01/2015, 10:49 AM  by Mickel Crow for surgery on 08-21-15.  Preop Total Hip - Anterior Approach orders including IV Tylenol, and IV Decadron as long as there are no contraindications to the above medications. Arlee Muslim, PA-C

## 2015-08-06 ENCOUNTER — Telehealth: Payer: Self-pay | Admitting: Pharmacist

## 2015-08-06 NOTE — Telephone Encounter (Signed)
Oral chemo f/u call. Pt was at her MD visit per her mother (Ms. Gorden Harms).  Mother states she is not aware of any problems and no episodes of diarrhea since she saw [facility] last week.   Instructed mother not to hesitate to call if problems arise.  She agreed.

## 2015-08-08 ENCOUNTER — Encounter: Payer: Self-pay | Admitting: Pharmacist

## 2015-08-08 ENCOUNTER — Other Ambulatory Visit: Payer: Self-pay | Admitting: Oncology

## 2015-08-08 DIAGNOSIS — C649 Malignant neoplasm of unspecified kidney, except renal pelvis: Secondary | ICD-10-CM

## 2015-08-08 MED ORDER — PAZOPANIB HCL 200 MG PO TABS: 800.0000 mg | ORAL_TABLET | Freq: Every day | ORAL | Status: DC

## 2015-08-08 MED FILL — *VOTRIENT 200 MG TABLET: 200 | 30 days supply | Qty: 120 | Fill #0

## 2015-08-08 NOTE — Progress Notes (Signed)
Oral Chemotherapy Follow-Up Form  Original Start date of oral chemotherapy: 07/16/15   Called patient today to follow up regarding patient's oral chemotherapy medication: Votrient 800 mg daily  Pt is doing well today. Appetite has improved per patient. Says she is able to eat. Energy levels are good. Does not report anymore nausea since compazine prescribed a few weeks ago. She knows to call us with any changes. No missed doses reported  Pt reports 0  tablets/doses missed in the last week/month.    Pt reports the following side effects: none to report this call - will monitor appetite  Refill sent into [facility] outpatient pharmacy as patient will need new rx for next week   Will follow up and call patient again in 4 weeks   Thank you,  Montel Clock, PharmD, [facility]

## 2015-08-21 ENCOUNTER — Encounter (HOSPITAL_COMMUNITY): Admission: RE | Payer: Self-pay | Source: Ambulatory Visit

## 2015-08-21 ENCOUNTER — Inpatient Hospital Stay (HOSPITAL_COMMUNITY): Admission: RE | Admit: 2015-08-21 | Payer: Medicare Other | Source: Ambulatory Visit | Admitting: Orthopedic Surgery

## 2015-08-21 SURGERY — ARTHROPLASTY, HIP, TOTAL, ANTERIOR APPROACH
Anesthesia: Choice | Site: Hip | Laterality: Right

## 2015-08-28 ENCOUNTER — Ambulatory Visit (HOSPITAL_BASED_OUTPATIENT_CLINIC_OR_DEPARTMENT_OTHER): Payer: Medicare Other | Admitting: Oncology

## 2015-08-28 ENCOUNTER — Encounter: Payer: Self-pay | Admitting: Oncology

## 2015-08-28 ENCOUNTER — Other Ambulatory Visit (HOSPITAL_BASED_OUTPATIENT_CLINIC_OR_DEPARTMENT_OTHER): Payer: Medicare Other

## 2015-08-28 ENCOUNTER — Telehealth: Payer: Self-pay | Admitting: Oncology

## 2015-08-28 VITALS — BP 153/78 | HR 88 | Temp 97.9°F | Resp 18 | Ht 68.0 in | Wt 222.9 lb

## 2015-08-28 DIAGNOSIS — C642 Malignant neoplasm of left kidney, except renal pelvis: Secondary | ICD-10-CM | POA: Diagnosis not present

## 2015-08-28 DIAGNOSIS — C649 Malignant neoplasm of unspecified kidney, except renal pelvis: Secondary | ICD-10-CM

## 2015-08-28 LAB — CBC WITH DIFFERENTIAL/PLATELET
BASO%: 0.3 % (ref 0.0–2.0)
BASOS ABS: 0 10*3/uL (ref 0.0–0.1)
EOS%: 1.2 % (ref 0.0–7.0)
Eosinophils Absolute: 0.1 10*3/uL (ref 0.0–0.5)
HEMATOCRIT: 38.2 % (ref 34.8–46.6)
HGB: 12.1 g/dL (ref 11.6–15.9)
LYMPH#: 4.1 10*3/uL — AB (ref 0.9–3.3)
LYMPH%: 59 % — ABNORMAL HIGH (ref 14.0–49.7)
MCH: 28.5 pg (ref 25.1–34.0)
MCHC: 31.7 g/dL (ref 31.5–36.0)
MCV: 90.1 fL (ref 79.5–101.0)
MONO#: 0.5 10*3/uL (ref 0.1–0.9)
MONO%: 6.8 % (ref 0.0–14.0)
NEUT#: 2.3 10*3/uL (ref 1.5–6.5)
NEUT%: 32.7 % — AB (ref 38.4–76.8)
PLATELETS: 407 10*3/uL — AB (ref 145–400)
RBC: 4.24 10*6/uL (ref 3.70–5.45)
RDW: 19.1 % — ABNORMAL HIGH (ref 11.2–14.5)
WBC: 6.9 10*3/uL (ref 3.9–10.3)

## 2015-08-28 LAB — COMPREHENSIVE METABOLIC PANEL
ALK PHOS: 116 U/L (ref 40–150)
ALT: 13 U/L (ref 0–55)
ANION GAP: 9 meq/L (ref 3–11)
AST: 14 U/L (ref 5–34)
Albumin: 3.3 g/dL — ABNORMAL LOW (ref 3.5–5.0)
BUN: 13.2 mg/dL (ref 7.0–26.0)
CALCIUM: 9.4 mg/dL (ref 8.4–10.4)
CHLORIDE: 105 meq/L (ref 98–109)
CO2: 26 mEq/L (ref 22–29)
CREATININE: 1.4 mg/dL — AB (ref 0.6–1.1)
EGFR: 44 mL/min/{1.73_m2} — ABNORMAL LOW (ref >90)
Glucose: 75 mg/dl (ref 70–140)
Potassium: 4.5 mEq/L (ref 3.5–5.1)
Sodium: 140 mEq/L (ref 136–145)
Total Bilirubin: <0.3 mg/dL (ref 0.20–1.20)
Total Protein: 7.8 g/dL (ref 6.4–8.3)

## 2015-08-28 MED ORDER — CLOBETASOL PROPIONATE 0.05 % EX CREA: 1.0000 "application " | TOPICAL_CREAM | Freq: Two times a day (BID) | CUTANEOUS | Status: DC

## 2015-08-28 NOTE — Progress Notes (Signed)
Hematology and Oncology Follow Up Visit  TACORA FICCO [id number] [birth year] 65 y.o. 08/28/2015 10:11 AM Laura Neal, Laura Brow, MD   Principle Diagnosis: 65 year old woman with renal cell carcinoma. She presented with a 9.5 x 7.9 x 8.5 cm mass in the left kidney in March 2017. PET CT scan showed metastatic disease to the lung as well as the adrenal glands. This is biopsy proven to be renal cell carcinoma.   Prior Therapy: She is status post biopsy of her kidney mass which confirmed the presence of renal cell carcinoma on 07/04/2015.  Current therapy: Votrient 800 mg daily started on 07/16/2015.  Interim History: Laura Neal presents today for a follow-up visit. Since the last visit, she reports no major changes in her health. She has reported some erythema in her palms with some irritation associated with it. She have used moisturizer cream which have helped at this time. There is no ulcers or desquamation noted. She does not report any other side effects associated with Votrient. She did report some mild nausea but no vomiting. She does take Compazine which have helped her symptoms. She did have grade 1 fatigue. She did report some decline in her appetite and lost a few pounds pounds. Otherwise her quality of life and performance status remains excellent.   She denied any pelvic pain or abdominal pain. She denied any excessive diarrhea or any other GI symptoms.  She does not report any headaches, blurry vision, syncope or seizures. She does not report any fevers, chills, sweats or decline in her energy. She does not report any chest pain, palpitation, orthopnea or leg edema. She does not report any cough, wheezing or hemoptysis. She does not report any nausea, vomiting, abdominal pain, hematochezia or melena. She does not report any frequency, urgency or hesitancy. She does not report any arthralgias or myalgias. She does not report any lymphadenopathy or petechiae. Remaining review of  systems unremarkable.    Medications: I have reviewed the patient's current medications.  Current Outpatient Prescriptions  Medication Sig Dispense Refill  . aspirin 81 MG tablet Take 81 mg by mouth daily. Reported on 06/14/2015    . BYETTA 10 MCG PEN 10 MCG/0.04ML SOPN injection Inject 10 mg into the skin 2 (two) times daily.    . Canagliflozin (INVOKANA) 100 MG TABS Take 100 mg by mouth daily before breakfast.     . Exenatide (BYETTA 10 MCG PEN [place]) Inject 10 mcg into the skin 2 (two) times daily with a meal.    . fluticasone (FLONASE) 50 MCG/ACT nasal spray Place 2 sprays into the nose daily. (Patient taking differently: Place 2 sprays into the nose daily as needed for allergies. ) 16 g 6  . insulin glargine (LANTUS) 100 UNIT/ML injection Inject 46 Units into the skin at bedtime.     [name] losartan-hydrochlorothiazide (HYZAAR) 100-25 MG tablet Take 1 tablet by mouth daily.    . metFORMIN (GLUCOPHAGE) 1000 MG tablet Take 1,000 mg by mouth 2 (two) times daily with a meal.    . pazopanib (VOTRIENT) 200 MG tablet Take 4 tablets (800 mg total) by mouth daily. Take on an empty stomach. 120 tablet 0  . prochlorperazine (COMPAZINE) 10 MG tablet Take 1 tablet (10 mg total) by mouth every 6 (six) hours as needed for nausea or vomiting. 30 tablet 1  . traMADol (ULTRAM) 50 MG tablet Take 50 mg by mouth every 6 (six) hours as needed for moderate pain or severe pain.   0  . clobetasol  cream (TEMOVATE) [id number] % Apply 1 application topically 2 (two) times daily. 30 g 0   No current facility-administered medications for this visit.     Allergies:  Allergies  Allergen Reactions  . Codeine Rash    Past Medical History, Surgical history, Social history, and Family History were reviewed and updated.  [name] Physical Exam: Blood pressure 153/78, pulse 88, temperature 97.9 F (36.6 C), temperature source Oral, resp. rate 18, height 5\' 8"  (1.727 m), weight 222 lb 14.4 oz (101.107 kg), SpO2 100 %. ECOG: 0 General  appearance: Pleasant woman appeared without distress. Head: Normocephalic, without obvious abnormality no oral ulcers or lesions. Neck: no adenopathy Lymph nodes: Cervical, supraclavicular, and axillary nodes normal. Heart:regular rate and rhythm, S1, S2 normal, no murmur, click, rub or gallop Lung:chest clear, no wheezing, rales, normal symmetric air entry Abdomin: soft, non-tender, without masses or organomegaly no rebound or guarding. EXT:no erythema, induration, or nodules. Erythema noted on her palms without any blisters or lesions.   Lab Results: Lab Results  Component Value Date   WBC 6.9 08/28/2015   HGB 12.1 08/28/2015   HCT 38.2 08/28/2015   MCV 90.1 08/28/2015   PLT 407* 08/28/2015     Chemistry      Component Value Date/Time   NA 140 08/28/2015 0915   NA 141 06/14/2015 1851   K 4.5 08/28/2015 0915   K 3.9 06/14/2015 1851   CL 104 06/14/2015 1851   CO2 26 08/28/2015 0915   CO2 27 06/14/2015 1851   BUN 13.2 08/28/2015 0915   BUN 13 06/14/2015 1851   CREATININE 1.4* 08/28/2015 0915   CREATININE 1.65* 06/14/2015 1851   CREATININE 1.22* 08/24/2013 1654      Component Value Date/Time   CALCIUM 9.4 08/28/2015 0915   CALCIUM 9.3 06/14/2015 1851   ALKPHOS 116 08/28/2015 0915   ALKPHOS 74 08/24/2013 1654   AST 14 08/28/2015 0915   AST 18 08/24/2013 1654   ALT 13 08/28/2015 0915   ALT 15 08/24/2013 1654   BILITOT <0.30 08/28/2015 0915   BILITOT 0.3 08/24/2013 1654       Impression and Plan:  65 year old woman with the following issues:  1. Renal mass representing with hypermetabolic lesion measuring 9.5 x 7.9 x 8.5 cm in the posterior left kidney. PET/CT scan showed lesions in the lung, adrenal and possible tumor thrombus in the left renal vein.  She is status post biopsy on 07/04/2015. The biopsy confirmed the presence of renal cell carcinoma.  She started on Votrient at 800 mg daily and have tolerated it well so far. She did report some mild fatigue and  and weight loss. The plan is to continue the same dose and schedule and repeat imaging studies in one month. If she continues to have progressive side effects associated with this medication, we will reduce her dose to 600 mg. If her CT scan showed response to therapy the plan is to continue on this medication. Different salvage therapy will be used if she has progressive disease.  2. Liver function surveillance: Her LFTs were reviewed from today and appears within normal range.  3. Diarrhea prophylaxis: She has Imodium with instructions how to use it if she develops any diarrhea.  4. Renal insufficiency: Creatinine is close to baseline of 1.4.  6. 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 Continue to restrict her to use moisturizer cream I gave her prescription for topical steroids for better relief.  7. Follow-up: Will be in one month to  assess for any complications.   [facility], MD 5/31/201710:11 AM

## 2015-08-28 NOTE — Progress Notes (Signed)
left in box by nurse 5/31/17left for dr Alen Blew to sign

## 2015-08-28 NOTE — Telephone Encounter (Signed)
Gave pt apt & avs °

## 2015-08-29 ENCOUNTER — Telehealth: Payer: Self-pay | Admitting: Oncology

## 2015-08-29 ENCOUNTER — Encounter: Payer: Self-pay | Admitting: Oncology

## 2015-08-29 NOTE — Telephone Encounter (Signed)
lab changed to earlier per radiology, spoke w/ pt confirmed apt time

## 2015-08-29 NOTE — Progress Notes (Signed)
left in box by nurse 5/31/17left for dr Alen Blew to sign-faxed to   [phone] and mailed copy to patient for her records-copy sent to medical records

## 2015-09-03 ENCOUNTER — Encounter: Payer: Self-pay | Admitting: Pharmacist

## 2015-09-03 NOTE — Progress Notes (Signed)
Oral Chemotherapy Follow-Up Form  Original Start date of oral chemotherapy: 07/16/15  Called patient today to follow up regarding patient's oral chemotherapy medication: Votrient Pt is currently on 800 mg daily on empty stomach  Pt reports 0 tablets/doses missed in the last month.   Pt reports the following side effects:  Skin cracking - one episode.  Dr. Alen Blew aware.  RX'd Clobetasol cream.  The area is completely healed now and no new areas of skin cracking/peeling/ulcers. Nausea - occasional.  Has to use Compazine on occasion but not often. Decreased appetite- doing better so far this week w/ appetite.  It picked up.  Other Issues:  Hip pain (pre-existing).  Uses Ibuprofen occasionally when pain not managed w/ Ultram.  LFT's & CBC wnl on 08/28/15 visit. Indigestion - uses Antacids but knows to separate the dose of Votrient & antacid by several hours.  Antacid may decrease serum conc of Votrient.   She is checking her BP and was at the dentist yesterday.  She doesn't remember the SBP but the DBP was 81.  Last checked here was 153/78 on 5/31.  Pt states last week at home her BP = 157/70's.  She uses Hyzaar. She denied fever, SOB, CP.  Will follow up and call patient again in 1 month (after scans/MD visit).  Thank you, Kennith Center, Pharm.D., CPP 09/03/2015@12 :11 PM [facility]otherapy [facility]

## 2015-09-09 ENCOUNTER — Telehealth: Payer: Self-pay | Admitting: Pharmacist

## 2015-09-09 NOTE — Telephone Encounter (Signed)
Pt called [facility].  She has enough Votrient to get her until this Thurs (6/15).  She needs a refill sent to [facility]. Forwarded request to Dr. Alen Blew & desk RN's [facility] & [place]. Kennith Center, Pharm.D., CPP 09/09/2015@5 :03 PM Oral Chemo Clinic

## 2015-09-10 ENCOUNTER — Other Ambulatory Visit: Payer: Self-pay | Admitting: *Deleted

## 2015-09-10 DIAGNOSIS — C649 Malignant neoplasm of unspecified kidney, except renal pelvis: Secondary | ICD-10-CM

## 2015-09-10 MED ORDER — PAZOPANIB HCL 200 MG PO TABS: 800.0000 mg | ORAL_TABLET | Freq: Every day | ORAL | Status: DC

## 2015-09-10 MED FILL — *VOTRIENT 200 MG TABLET: 200 | 30 days supply | Qty: 120 | Fill #0

## 2015-09-16 ENCOUNTER — Other Ambulatory Visit: Payer: Self-pay | Admitting: *Deleted

## 2015-09-16 DIAGNOSIS — C649 Malignant neoplasm of unspecified kidney, except renal pelvis: Secondary | ICD-10-CM

## 2015-09-20 ENCOUNTER — Other Ambulatory Visit: Payer: Medicare Other

## 2015-09-20 ENCOUNTER — Other Ambulatory Visit (HOSPITAL_BASED_OUTPATIENT_CLINIC_OR_DEPARTMENT_OTHER): Payer: Medicare Other

## 2015-09-20 ENCOUNTER — Ambulatory Visit: Payer: Medicare Other | Admitting: Oncology

## 2015-09-20 ENCOUNTER — Ambulatory Visit (HOSPITAL_COMMUNITY)
Admission: RE | Admit: 2015-09-20 | Discharge: 2015-09-20 | Disposition: A | Discharge status: Home / Self Care | Payer: Medicare Other | Source: Ambulatory Visit | Attending: Oncology | Admitting: Oncology

## 2015-09-20 DIAGNOSIS — K219 Gastro-esophageal reflux disease without esophagitis: Secondary | ICD-10-CM | POA: Insufficient documentation

## 2015-09-20 DIAGNOSIS — I7090 Unspecified atherosclerosis: Secondary | ICD-10-CM | POA: Diagnosis not present

## 2015-09-20 DIAGNOSIS — M1612 Unilateral primary osteoarthritis, left hip: Secondary | ICD-10-CM | POA: Insufficient documentation

## 2015-09-20 DIAGNOSIS — Z905 Acquired absence of kidney: Secondary | ICD-10-CM | POA: Insufficient documentation

## 2015-09-20 DIAGNOSIS — K5730 Diverticulosis of large intestine without perforation or abscess without bleeding: Secondary | ICD-10-CM | POA: Insufficient documentation

## 2015-09-20 DIAGNOSIS — C799 Secondary malignant neoplasm of unspecified site: Secondary | ICD-10-CM | POA: Diagnosis present

## 2015-09-20 DIAGNOSIS — K7689 Other specified diseases of liver: Secondary | ICD-10-CM | POA: Insufficient documentation

## 2015-09-20 DIAGNOSIS — C642 Malignant neoplasm of left kidney, except renal pelvis: Secondary | ICD-10-CM

## 2015-09-20 DIAGNOSIS — C7800 Secondary malignant neoplasm of unspecified lung: Secondary | ICD-10-CM | POA: Diagnosis not present

## 2015-09-20 DIAGNOSIS — R590 Localized enlarged lymph nodes: Secondary | ICD-10-CM | POA: Insufficient documentation

## 2015-09-20 DIAGNOSIS — C641 Malignant neoplasm of right kidney, except renal pelvis: Secondary | ICD-10-CM | POA: Insufficient documentation

## 2015-09-20 DIAGNOSIS — C649 Malignant neoplasm of unspecified kidney, except renal pelvis: Secondary | ICD-10-CM

## 2015-09-20 DIAGNOSIS — N289 Disorder of kidney and ureter, unspecified: Secondary | ICD-10-CM | POA: Insufficient documentation

## 2015-09-20 LAB — CBC WITH DIFFERENTIAL/PLATELET
BASO%: 0.6 % (ref 0.0–2.0)
BASOS ABS: 0 10*3/uL (ref 0.0–0.1)
EOS ABS: 0.1 10*3/uL (ref 0.0–0.5)
EOS%: 0.7 % (ref 0.0–7.0)
HEMATOCRIT: 44.2 % (ref 34.8–46.6)
HGB: 14.2 g/dL (ref 11.6–15.9)
LYMPH#: 3.8 10*3/uL — AB (ref 0.9–3.3)
LYMPH%: 45 % (ref 14.0–49.7)
MCH: 30.2 pg (ref 25.1–34.0)
MCHC: 32.2 g/dL (ref 31.5–36.0)
MCV: 94 fL (ref 79.5–101.0)
MONO#: 0.5 10*3/uL (ref 0.1–0.9)
MONO%: 6.3 % (ref 0.0–14.0)
NEUT#: 4 10*3/uL (ref 1.5–6.5)
NEUT%: 47.4 % (ref 38.4–76.8)
Platelets: 354 10*3/uL (ref 145–400)
RBC: 4.7 10*6/uL (ref 3.70–5.45)
RDW: 21.6 % — ABNORMAL HIGH (ref 11.2–14.5)
WBC: 8.3 10*3/uL (ref 3.9–10.3)

## 2015-09-20 LAB — COMPREHENSIVE METABOLIC PANEL
ALK PHOS: 100 U/L (ref 40–150)
ALT: 16 U/L (ref 0–55)
AST: 15 U/L (ref 5–34)
Albumin: 3.5 g/dL (ref 3.5–5.0)
Anion Gap: 12 mEq/L — ABNORMAL HIGH (ref 3–11)
BUN: 17.6 mg/dL (ref 7.0–26.0)
CHLORIDE: 105 meq/L (ref 98–109)
CO2: 22 meq/L (ref 22–29)
Calcium: 9.4 mg/dL (ref 8.4–10.4)
Creatinine: 1.9 mg/dL — ABNORMAL HIGH (ref 0.6–1.1)
EGFR: 30 mL/min/{1.73_m2} — AB (ref >90)
GLUCOSE: 104 mg/dL (ref 70–140)
POTASSIUM: 4.9 meq/L (ref 3.5–5.1)
SODIUM: 139 meq/L (ref 136–145)
Total Bilirubin: 0.44 mg/dL (ref 0.20–1.20)
Total Protein: 8.2 g/dL (ref 6.4–8.3)

## 2015-09-24 ENCOUNTER — Ambulatory Visit (HOSPITAL_BASED_OUTPATIENT_CLINIC_OR_DEPARTMENT_OTHER): Payer: Medicare Other | Admitting: Oncology

## 2015-09-24 ENCOUNTER — Telehealth: Payer: Self-pay | Admitting: Oncology

## 2015-09-24 VITALS — BP 144/77 | HR 81 | Temp 97.5°F | Resp 18 | Ht 68.0 in | Wt 219.4 lb

## 2015-09-24 DIAGNOSIS — C642 Malignant neoplasm of left kidney, except renal pelvis: Secondary | ICD-10-CM

## 2015-09-24 DIAGNOSIS — C7801 Secondary malignant neoplasm of right lung: Secondary | ICD-10-CM

## 2015-09-24 DIAGNOSIS — C7802 Secondary malignant neoplasm of left lung: Secondary | ICD-10-CM

## 2015-09-24 DIAGNOSIS — E279 Disorder of adrenal gland, unspecified: Secondary | ICD-10-CM | POA: Diagnosis not present

## 2015-09-24 DIAGNOSIS — C649 Malignant neoplasm of unspecified kidney, except renal pelvis: Secondary | ICD-10-CM

## 2015-09-24 DIAGNOSIS — N289 Disorder of kidney and ureter, unspecified: Secondary | ICD-10-CM

## 2015-09-24 DIAGNOSIS — L271 Localized skin eruption due to drugs and medicaments taken internally: Secondary | ICD-10-CM

## 2015-09-24 MED ORDER — CLOBETASOL PROPIONATE 0.05 % EX CREA: 1.0000 "application " | TOPICAL_CREAM | Freq: Two times a day (BID) | CUTANEOUS | Status: DC

## 2015-09-24 NOTE — Progress Notes (Signed)
Hematology and Oncology Follow Up Visit  Laura Neal [id number] 1951-02-18 65 y.o. 09/24/2015 9:18 AM Laura Neal, Laura Brow, MD   Principle Diagnosis: 65 year old woman with renal cell carcinoma. She presented with a 9.5 x 7.9 x 8.5 cm mass in the left kidney in March 2017. PET CT scan showed metastatic disease to the lung as well as the adrenal glands. This is biopsy proven to be renal cell carcinoma.   Prior Therapy: She is status post biopsy of her kidney mass which confirmed the presence of renal cell carcinoma on 07/04/2015.  Current therapy: Votrient 800 mg daily started on 07/16/2015.  Interim History: Laura Neal presents today for a follow-up visit. Since the last visit, she continues to do very well without any major complaints. She continues to have mild erythema in her palms consistent with hand-foot syndrome. She is applying moisturizer creams and steroid cream without further complications. There is no ulcers or desquamation noted. She does not report any other side effects associated with Votrient. She did report some mild nausea but no vomiting. She did have grade 1 fatigue. She did report some decline in her appetite and lost 3 pounds. Her quality of life and performance status not dramatically changed. She denied any other GI symptoms. Otherwise her quality of life and performance status remains excellent.   She does not report any headaches, blurry vision, syncope or seizures. She does not report any fevers, chills, sweats or decline in her energy. She does not report any chest pain, palpitation, orthopnea or leg edema. She does not report any cough, wheezing or hemoptysis. She does not report any nausea, vomiting, abdominal pain, hematochezia or melena. She does not report any frequency, urgency or hesitancy. She does not report any arthralgias or myalgias. She does not report any lymphadenopathy or petechiae. Remaining review of systems unremarkable.    Medications: I  have reviewed the patient's current medications.  Current Outpatient Prescriptions  Medication Sig Dispense Refill  . aspirin 81 MG tablet Take 81 mg by mouth daily. Reported on 06/14/2015    . BYETTA 10 MCG PEN 10 MCG/0.04ML SOPN injection Inject 10 mg into the skin 2 (two) times daily.    . Canagliflozin (INVOKANA) 100 MG TABS Take 100 mg by mouth daily before breakfast.     . clobetasol cream (TEMOVATE) [id number] % Apply 1 application topically 2 (two) times daily. 60 g 2  . Exenatide (BYETTA 10 MCG PEN [place]) Inject 10 mcg into the skin 2 (two) times daily with a meal.    . fluticasone (FLONASE) 50 MCG/ACT nasal spray Place 2 sprays into the nose daily. (Patient taking differently: Place 2 sprays into the nose daily as needed for allergies. ) 16 g 6  . insulin glargine (LANTUS) 100 UNIT/ML injection Inject 46 Units into the skin at bedtime.     [name] losartan-hydrochlorothiazide (HYZAAR) 100-25 MG tablet Take 1 tablet by mouth daily.    . metFORMIN (GLUCOPHAGE) 1000 MG tablet Take 1,000 mg by mouth 2 (two) times daily with a meal.    . pazopanib (VOTRIENT) 200 MG tablet Take 4 tablets (800 mg total) by mouth daily. Take on an empty stomach. 120 tablet 0  . prochlorperazine (COMPAZINE) 10 MG tablet Take 1 tablet (10 mg total) by mouth every 6 (six) hours as needed for nausea or vomiting. 30 tablet 1  . traMADol (ULTRAM) 50 MG tablet Take 50 mg by mouth every 6 (six) hours as needed for moderate pain or severe pain.  0   No current facility-administered medications for this visit.     Allergies:  Allergies  Allergen Reactions  . Codeine Rash    Past Medical History, Surgical history, Social history, and Family History were reviewed and updated.  [name] Physical Exam: Blood pressure 144/77, pulse 81, temperature 97.5 F (36.4 C), temperature source Oral, resp. rate 18, height 5\' 8"  (1.727 m), weight 219 lb 6.4 oz (99.519 kg), SpO2 100 %. ECOG: 0 General appearance: Alert, awake woman without  distress. Head: Normocephalic, without obvious abnormality no oral thrush noted. Neck: no adenopathy Lymph nodes: Cervical, supraclavicular, and axillary nodes normal. Heart:regular rate and rhythm, S1, S2 normal, no murmur, click, rub or gallop Lung:chest clear, no wheezing, rales, normal symmetric air entry Abdomin: soft, non-tender, without masses or organomegaly no rebound or guarding. EXT:. Erythema noted on her palms without any blisters or lesions. Her skin is moist without irritation.   Lab Results: Lab Results  Component Value Date   WBC 8.3 09/20/2015   HGB 14.2 09/20/2015   HCT 44.2 09/20/2015   MCV 94.0 09/20/2015   PLT 354 09/20/2015     Chemistry      Component Value Date/Time   NA 139 09/20/2015 0805   NA 141 06/14/2015 1851   K 4.9 09/20/2015 0805   K 3.9 06/14/2015 1851   CL 104 06/14/2015 1851   CO2 22 09/20/2015 0805   CO2 27 06/14/2015 1851   BUN 17.6 09/20/2015 0805   BUN 13 06/14/2015 1851   CREATININE 1.9* 09/20/2015 0805   CREATININE 1.65* 06/14/2015 1851   CREATININE 1.22* 08/24/2013 1654      Component Value Date/Time   CALCIUM 9.4 09/20/2015 0805   CALCIUM 9.3 06/14/2015 1851   ALKPHOS 100 09/20/2015 0805   ALKPHOS 74 08/24/2013 1654   AST 15 09/20/2015 0805   AST 18 08/24/2013 1654   ALT 16 09/20/2015 0805   ALT 15 08/24/2013 1654   BILITOT 0.44 09/20/2015 0805   BILITOT 0.3 08/24/2013 1654     EXAM: CT CHEST, ABDOMEN AND PELVIS WITHOUT CONTRAST  TECHNIQUE: Multidetector CT imaging of the chest, abdomen and pelvis was performed following the standard protocol without IV contrast.  COMPARISON: Multiple exams, including 06/26/2015  FINDINGS: CT CHEST FINDINGS  Mediastinum/Nodes: AP window lymph node 1.3 cm in short axis image 24/2, formerly 1.6 cm.  Coronary, aortic arch, and branch vessel atherosclerotic vascular disease. Small amount of contrast in the distal esophagus likely from gastroesophageal  reflux.  Lungs/Pleura: The bilateral pulmonary metastatic lesions are reduced in size compared to the prior exam, with an index right lower lobe lesion measuring 1.9 by 1.6 cm on image 107/4 (formerly 2.6 by 2.2 cm). The other lesions are also proportionally reduced in size. No new pulmonary nodules. Subsegmental atelectasis or scarring in both lower lobes in the lingula.  Musculoskeletal: Thoracic kyphosis. Faint sclerosis in the upper sternal body, nonspecific. Faint central sclerosis in the sternal manubrium. Probably incidental but merits observation.  CT ABDOMEN PELVIS FINDINGS  Hepatobiliary: 0.8 by 0.8 cm hypodense lesion in the caudate lobe, not appreciably changed from 06/14/2015 and not appreciably hypermetabolic on prior PET-CT, although admittedly small in size. 5 mm hypodense lesion in the left hepatic lobe, image 54/2.  Pancreas: Unremarkable  Spleen: Unremarkable  Adrenals/Urinary Tract: 1.4 by 2.7 cm left adrenal mass, internal density 4 Hounsfield units.  Multilobular appearance of the kidney, with a dominant posterior mass in the left mid kidney measuring 7.9 by 6.2 cm, formerly 9.5 by  7.9 cm. A smaller anterior mass in the left mid kidney measures 2.2 by 1.8 cm, formerly 3.3 cm in long axis.  Stomach/Bowel: Sigmoid colon diverticulosis proximally. Suspected small bowel diverticulum in the right abdomen at the level of the iliac crests common not well seen on the prior PET-CT. Appendix normal.  Vascular/Lymphatic: Aortoiliac atherosclerotic vascular disease. Aortocaval node 7 mm in short axis on image 79/2, stable.  Reproductive: Uterus and ovaries absent.  Other: No supplemental non-categorized findings.  Musculoskeletal: Severe right and moderate to severe left hip osteoarthritis. Intervertebral spurring at L5-S1 potentially causing central narrowing of the thecal sac.  IMPRESSION: 1. Reduced size of the mediastinal adenopathy,  pulmonary metastatic lesions, and primary left renal lesions, compatible with interval response to therapy. 2. Several faint areas of sclerosis in the upper sternal body and sternal manubrium merit observation although are probably benign. 3. The left renal lesion measures only 4 Hounsfield units in density which would typically be characteristic of an adenoma. However, this lesion was mildly hypermetabolic, accordingly the possibility of a metastatic lesion is not completely ruled out. 4. Other imaging findings of potential clinical significance: Coronary, aortic arch, and branch vessel atherosclerotic vascular disease. Gastroesophageal reflux. Several tiny hepatic cysts. Right nephrectomy. Sigmoid diverticulosis. Aortoiliac atherosclerotic vascular disease. Markedly severe right and moderate to severe left hip osteoarthritis. Possible central narrowing of the thecal sac at L5-S1 due to spurring.    Impression and Plan:  65 year old woman with the following issues:  1. Renal mass representing with hypermetabolic lesion measuring 9.5 x 7.9 x 8.5 cm in the posterior left kidney. PET/CT scan showed lesions in the lung, adrenal and possible tumor thrombus in the left renal vein.  She is status post biopsy on 07/04/2015. The biopsy confirmed the presence of renal cell carcinoma.  She on Votrient at 800 mg daily and have tolerated it well so far. CT scan on 09/20/2015 was reviewed today and showed positive response to therapy. Her lung masses as well as lymphadenopathy is responding. She has few complications associated with this medication but have been manageable. I recommended continuing the same dose and schedule for the time being. Dose reductions could be considered if she has cumulative toxicities.  2. Liver function surveillance: Her LFTs were reviewed from today and appears within normal range.  3. Diarrhea prophylaxis: She has Imodium with instructions how to use it if she  develops any diarrhea.  4. Renal insufficiency: Creatinine is close to baseline of 1.4.  6. 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 I have instructed her to continue with supportive measures including moisturizing creams and steroid cream. If these symptoms worsen, dose reduction of Votrient will be needed.  7. Follow-up: Will be in one month to assess for any complications.   [facility], MD 6/27/20179:18 AM

## 2015-09-24 NOTE — Telephone Encounter (Signed)
Gave pt cal & avs °

## 2015-10-09 ENCOUNTER — Other Ambulatory Visit: Payer: Self-pay | Admitting: *Deleted

## 2015-10-09 ENCOUNTER — Telehealth: Payer: Self-pay | Admitting: Pharmacist

## 2015-10-09 DIAGNOSIS — C649 Malignant neoplasm of unspecified kidney, except renal pelvis: Secondary | ICD-10-CM

## 2015-10-09 MED ORDER — PAZOPANIB HCL 200 MG PO TABS: 800.0000 mg | ORAL_TABLET | Freq: Every day | ORAL | Status: DC

## 2015-10-09 MED FILL — *VOTRIENT 200 MG TABLET: 200 | 30 days supply | Qty: 120 | Fill #0

## 2015-10-09 NOTE — Telephone Encounter (Signed)
Pt needs Votrient refill. Will forward to Dr. Alen Blew & [facility], RN  Kennith Center, [place].D., CPP 10/09/2015@2 :[facility]

## 2015-10-24 ENCOUNTER — Ambulatory Visit (HOSPITAL_BASED_OUTPATIENT_CLINIC_OR_DEPARTMENT_OTHER): Payer: Medicare Other | Admitting: Oncology

## 2015-10-24 ENCOUNTER — Telehealth: Payer: Self-pay | Admitting: Oncology

## 2015-10-24 ENCOUNTER — Other Ambulatory Visit (HOSPITAL_BASED_OUTPATIENT_CLINIC_OR_DEPARTMENT_OTHER): Payer: Medicare Other

## 2015-10-24 VITALS — BP 133/63 | HR 81 | Temp 97.8°F | Resp 18 | Ht 68.0 in | Wt 219.4 lb

## 2015-10-24 DIAGNOSIS — C7801 Secondary malignant neoplasm of right lung: Secondary | ICD-10-CM

## 2015-10-24 DIAGNOSIS — C642 Malignant neoplasm of left kidney, except renal pelvis: Secondary | ICD-10-CM

## 2015-10-24 DIAGNOSIS — C7802 Secondary malignant neoplasm of left lung: Secondary | ICD-10-CM

## 2015-10-24 DIAGNOSIS — L271 Localized skin eruption due to drugs and medicaments taken internally: Secondary | ICD-10-CM

## 2015-10-24 DIAGNOSIS — N289 Disorder of kidney and ureter, unspecified: Secondary | ICD-10-CM | POA: Diagnosis not present

## 2015-10-24 DIAGNOSIS — C649 Malignant neoplasm of unspecified kidney, except renal pelvis: Secondary | ICD-10-CM

## 2015-10-24 LAB — COMPREHENSIVE METABOLIC PANEL
ALK PHOS: 73 U/L (ref 40–150)
ALT: 13 U/L (ref 0–55)
ANION GAP: 11 meq/L (ref 3–11)
AST: 13 U/L (ref 5–34)
Albumin: 3.1 g/dL — ABNORMAL LOW (ref 3.5–5.0)
BILIRUBIN TOTAL: 0.47 mg/dL (ref 0.20–1.20)
BUN: 11.4 mg/dL (ref 7.0–26.0)
CO2: 23 meq/L (ref 22–29)
Calcium: 9.5 mg/dL (ref 8.4–10.4)
Chloride: 108 mEq/L (ref 98–109)
Creatinine: 1.3 mg/dL — ABNORMAL HIGH (ref 0.6–1.1)
EGFR: 47 mL/min/{1.73_m2} — AB (ref >90)
Glucose: 122 mg/dl (ref 70–140)
POTASSIUM: 4 meq/L (ref 3.5–5.1)
Sodium: 142 mEq/L (ref 136–145)
TOTAL PROTEIN: 7 g/dL (ref 6.4–8.3)

## 2015-10-24 LAB — CBC WITH DIFFERENTIAL/PLATELET
BASO%: 0.3 % (ref 0.0–2.0)
BASOS ABS: 0 10*3/uL (ref 0.0–0.1)
EOS ABS: 0.2 10*3/uL (ref 0.0–0.5)
EOS%: 3.9 % (ref 0.0–7.0)
HCT: 40.6 % (ref 34.8–46.6)
HGB: 13.2 g/dL (ref 11.6–15.9)
LYMPH%: 36.9 % (ref 14.0–49.7)
MCH: 32.3 pg (ref 25.1–34.0)
MCHC: 32.6 g/dL (ref 31.5–36.0)
MCV: 99 fL (ref 79.5–101.0)
MONO#: 0.4 10*3/uL (ref 0.1–0.9)
MONO%: 5.9 % (ref 0.0–14.0)
NEUT%: 53 % (ref 38.4–76.8)
NEUTROS ABS: 3.3 10*3/uL (ref 1.5–6.5)
PLATELETS: 335 10*3/uL (ref 145–400)
RBC: 4.1 10*6/uL (ref 3.70–5.45)
RDW: 20 % — ABNORMAL HIGH (ref 11.2–14.5)
WBC: 6.3 10*3/uL (ref 3.9–10.3)
lymph#: 2.3 10*3/uL (ref 0.9–3.3)

## 2015-10-24 NOTE — Progress Notes (Signed)
Hematology and Oncology Follow Up Visit  Laura Neal [id number] Feb 02, 1951 65 y.o. 10/24/2015 12:36 PM Laura Apple, MD   Principle Diagnosis: 65 year old woman with renal cell carcinoma. She presented with a 9.5 x 7.9 x 8.5 cm mass in the left kidney in March 2017. PET CT scan showed metastatic disease to the lung as well as the adrenal glands. This is biopsy proven to be renal cell carcinoma.   Prior Therapy: She is status post biopsy of her kidney mass which confirmed the presence of renal cell carcinoma on 07/04/2015.  Current therapy: Votrient 800 mg daily started on 07/16/2015.  Interim History: Laura Neal presents today for a follow-up visit by her self. Since the last visit, she reports no major changes in her health. She continues to tolerate Votrient without any new complications. She continues to have mild erythema in her palms consistent with hand-foot syndrome. The degree of irritation has not changed since the last visit and she continues to apply moisturizer cream and steroid cream. There is no ulcers or desquamation noted. She does not report any other side effects associated with Votrient. She did report some mild nausea but no vomiting. She denied any diarrhea or change in her bowel habits. Her quality of life and performance status not dramatically changed. Her weight and appetite remain excellent and her quality of life is unchanged.   She does not report any headaches, blurry vision, syncope or seizures. She does not report any fevers, chills, sweats or decline in her energy. She does not report any chest pain, palpitation, orthopnea or leg edema. She does not report any cough, wheezing or hemoptysis. She does not report any nausea, vomiting, abdominal pain, hematochezia or melena. She does not report any frequency, urgency or hesitancy. She does not report any arthralgias or myalgias. She does not report any lymphadenopathy or petechiae. Remaining review of  systems unremarkable.    Medications: I have reviewed the patient's current medications.  Current Outpatient Prescriptions  Medication Sig Dispense Refill  . aspirin 81 MG tablet Take 81 mg by mouth daily. Reported on 06/14/2015    . BYETTA 10 MCG PEN 10 MCG/0.04ML SOPN injection Inject 10 mg into the skin 2 (two) times daily.    . Canagliflozin (INVOKANA) 100 MG TABS Take 100 mg by mouth daily before breakfast.     . clobetasol cream (TEMOVATE) [id number] % Apply 1 application topically 2 (two) times daily. 60 g 2  . clotrimazole-betamethasone (LOTRISONE) cream Apply to affected area 2 times daily    . fluticasone (FLONASE) 50 MCG/ACT nasal spray Place 2 sprays into the nose daily. (Patient taking differently: Place 2 sprays into the nose daily as needed for allergies. ) 16 g 6  . furosemide (LASIX) 20 MG tablet Take 20 mg by mouth.    . insulin glargine (LANTUS) 100 UNIT/ML injection Inject 46 Units into the skin at bedtime.     [name] losartan-hydrochlorothiazide (HYZAAR) 100-25 MG tablet Take 1 tablet by mouth daily.    . metFORMIN (GLUCOPHAGE) 1000 MG tablet Take 1,000 mg by mouth 2 (two) times daily with a meal.    . pazopanib (VOTRIENT) 200 MG tablet Take 4 tablets (800 mg total) by mouth daily. Take on an empty stomach. 120 tablet 0  . prochlorperazine (COMPAZINE) 10 MG tablet Take 1 tablet (10 mg total) by mouth every 6 (six) hours as needed for nausea or vomiting. 30 tablet 1  . traMADol (ULTRAM) 50 MG tablet Take 50 mg by  mouth every 6 (six) hours as needed for moderate pain or severe pain.   0  . RESTASIS 0.05 % ophthalmic emulsion      No current facility-administered medications for this visit.      Allergies:  Allergies  Allergen Reactions  . Codeine Rash    Past Medical History, Surgical history, Social history, and Family History were reviewed and updated.  [name] Physical Exam: Blood pressure 133/63, pulse 81, temperature 97.8 F (36.6 C), temperature source Oral, resp. rate 18,  height 5\' 8"  (1.727 m), weight 219 lb 6.4 oz (99.5 kg), SpO2 100 %. ECOG: 0 General appearance: Well-appearing woman without distress. Head: Normocephalic, without obvious abnormality no oral thrush noted. Neck: no adenopathy Lymph nodes: Cervical, supraclavicular, and axillary nodes normal. Heart:regular rate and rhythm, S1, S2 normal, no murmur, click, rub or gallop Lung:chest clear, no wheezing, rales, normal symmetric air entry Abdomin: soft, non-tender, without masses or organomegaly no shifting dullness or ascites. EXT:. Erythema noted on her palms without any blisters or lesions. Her skin is moist without desquamation.   Lab Results: Lab Results  Component Value Date   WBC 6.3 10/24/2015   HGB 13.2 10/24/2015   HCT 40.6 10/24/2015   MCV 99.0 10/24/2015   PLT 335 10/24/2015     Chemistry      Component Value Date/Time   NA 139 09/20/2015 0805   K 4.9 09/20/2015 0805   CL 104 06/14/2015 1851   CO2 22 09/20/2015 0805   BUN 17.6 09/20/2015 0805   CREATININE 1.9 (H) 09/20/2015 0805      Component Value Date/Time   CALCIUM 9.4 09/20/2015 0805   ALKPHOS 100 09/20/2015 0805   AST 15 09/20/2015 0805   ALT 16 09/20/2015 0805   BILITOT 0.44 09/20/2015 0805      Impression and Plan:  65 year old woman with the following issues:  1. Renal mass representing with hypermetabolic lesion measuring 9.5 x 7.9 x 8.5 cm in the posterior left kidney. PET/CT scan showed lesions in the lung, adrenal and possible tumor thrombus in the left renal vein.  She is status post biopsy on 07/04/2015. The biopsy confirmed the presence of renal cell carcinoma.  She on Votrient at 800 mg daily since April 2017. CT scan on 09/20/2015 showed positive response to therapy. Her lung masses as well as lymphadenopathy is responding.   She continues to tolerate the current dose without any major complications. She has grade 1 hand-foot syndrome which is manageable. For the time being we'll continue with  the same dose and schedule and consider dose adjustments if she develops any other toxicities. Repeat CT scan will be anticipated on October 2017.  2. Liver function surveillance: Her LFTs continues to be within normal range. There are repeated today.  3. Diarrhea prophylaxis: She has Imodium with instructions how to use it if she develops any diarrhea.  4. Renal insufficiency: Creatinine is close to baseline of 1.9.  6. Hand-foot syndrome: I continue to educate her about different strategy to alleviate these symptoms including proper hygiene and applying moisturizer creams.  7. Follow-up: Will be in one month to assess for any complications.   [facility], MD 7/27/201712:36 PM

## 2015-10-24 NOTE — Telephone Encounter (Signed)
Gave pt cal & avs °

## 2015-11-07 ENCOUNTER — Other Ambulatory Visit: Payer: Self-pay | Admitting: *Deleted

## 2015-11-07 ENCOUNTER — Telehealth: Payer: Self-pay | Admitting: Pharmacist

## 2015-11-07 DIAGNOSIS — C649 Malignant neoplasm of unspecified kidney, except renal pelvis: Secondary | ICD-10-CM

## 2015-11-07 MED ORDER — PAZOPANIB HCL 200 MG PO TABS: 800.0000 mg | ORAL_TABLET | Freq: Every day | ORAL | 0 refills | Status: DC

## 2015-11-07 MED FILL — *VOTRIENT 200 MG TABLET: 200 | 30 days supply | Qty: 120 | Fill #0

## 2015-11-07 NOTE — Telephone Encounter (Signed)
Oral Chemotherapy Pharmacist Encounter   Received voicemail from patient about the need of a new prescription for Votrient. Pt stated that she would be out of medication this coming Monday 8/14.  I alerted Erline Levine, RN at Dr. Hazeline Junker desk of the request for Votrient new prescription.  I left voicemail for patient with this information and left oral chemo # [phone] and desk RN # [phone]) if patient has any questions or concerns.  Johny Drilling, PharmD, BCPS Oral [facility]otherapy Clinic

## 2015-11-28 ENCOUNTER — Telehealth: Payer: Self-pay | Admitting: Oncology

## 2015-11-28 ENCOUNTER — Ambulatory Visit (HOSPITAL_BASED_OUTPATIENT_CLINIC_OR_DEPARTMENT_OTHER): Payer: Medicare Other | Admitting: Oncology

## 2015-11-28 ENCOUNTER — Other Ambulatory Visit (HOSPITAL_BASED_OUTPATIENT_CLINIC_OR_DEPARTMENT_OTHER): Payer: Medicare Other

## 2015-11-28 VITALS — BP 134/65 | HR 83 | Temp 98.0°F | Resp 16 | Ht 68.0 in | Wt 215.5 lb

## 2015-11-28 DIAGNOSIS — C642 Malignant neoplasm of left kidney, except renal pelvis: Secondary | ICD-10-CM

## 2015-11-28 DIAGNOSIS — C7800 Secondary malignant neoplasm of unspecified lung: Secondary | ICD-10-CM | POA: Diagnosis not present

## 2015-11-28 DIAGNOSIS — C649 Malignant neoplasm of unspecified kidney, except renal pelvis: Secondary | ICD-10-CM

## 2015-11-28 DIAGNOSIS — C7970 Secondary malignant neoplasm of unspecified adrenal gland: Secondary | ICD-10-CM | POA: Diagnosis not present

## 2015-11-28 LAB — COMPREHENSIVE METABOLIC PANEL
ALK PHOS: 82 U/L (ref 40–150)
ALT: 13 U/L (ref 0–55)
ANION GAP: 10 meq/L (ref 3–11)
AST: 15 U/L (ref 5–34)
Albumin: 3.1 g/dL — ABNORMAL LOW (ref 3.5–5.0)
BUN: 10.8 mg/dL (ref 7.0–26.0)
CO2: 25 meq/L (ref 22–29)
Calcium: 9.5 mg/dL (ref 8.4–10.4)
Chloride: 106 mEq/L (ref 98–109)
Creatinine: 1.3 mg/dL — ABNORMAL HIGH (ref 0.6–1.1)
EGFR: 50 mL/min/{1.73_m2} — AB (ref >90)
GLUCOSE: 81 mg/dL (ref 70–140)
POTASSIUM: 4.3 meq/L (ref 3.5–5.1)
SODIUM: 142 meq/L (ref 136–145)
Total Bilirubin: 0.62 mg/dL (ref 0.20–1.20)
Total Protein: 7.5 g/dL (ref 6.4–8.3)

## 2015-11-28 LAB — CBC WITH DIFFERENTIAL/PLATELET
BASO%: 0.7 % (ref 0.0–2.0)
BASOS ABS: 0.1 10*3/uL (ref 0.0–0.1)
EOS ABS: 0.1 10*3/uL (ref 0.0–0.5)
EOS%: 1 % (ref 0.0–7.0)
HCT: 42.4 % (ref 34.8–46.6)
HGB: 14 g/dL (ref 11.6–15.9)
LYMPH%: 33.8 % (ref 14.0–49.7)
MCH: 33.7 pg (ref 25.1–34.0)
MCHC: 33 g/dL (ref 31.5–36.0)
MCV: 102.1 fL — AB (ref 79.5–101.0)
MONO#: 0.4 10*3/uL (ref 0.1–0.9)
MONO%: 5.7 % (ref 0.0–14.0)
NEUT#: 4.2 10*3/uL (ref 1.5–6.5)
NEUT%: 58.8 % (ref 38.4–76.8)
PLATELETS: 334 10*3/uL (ref 145–400)
RBC: 4.16 10*6/uL (ref 3.70–5.45)
RDW: 16.5 % — ABNORMAL HIGH (ref 11.2–14.5)
WBC: 7.1 10*3/uL (ref 3.9–10.3)
lymph#: 2.4 10*3/uL (ref 0.9–3.3)

## 2015-11-28 NOTE — Telephone Encounter (Signed)
GAVE PATIENT AVS REPORT AND APPOINTMENTS FOR October  °

## 2015-11-28 NOTE — Progress Notes (Signed)
Hematology and Oncology Follow Up Visit  Laura Neal [id number] 05-25-1950 65 y.o. 11/28/2015 11:09 AM Laura Neal, Laura Brow, MD   Principle Diagnosis: 65 year old woman with renal cell carcinoma. She presented with a 9.5 x 7.9 x 8.5 cm mass in the left kidney in March 2017. PET CT scan showed metastatic disease to the lung as well as the adrenal glands. This is biopsy proven to be renal cell carcinoma.   Prior Therapy: She is status post biopsy of her kidney mass which confirmed the presence of renal cell carcinoma on 07/04/2015.  Current therapy: Votrient 800 mg daily started on 07/16/2015.  Interim History: Laura Neal presents today for a follow-up visit by her self. Since the last visit, she continues to do well without any major changes or complaints. She continues to tolerate Votrient without any new complications. She continues to have mild erythema in her palms which remains stable and slightly improved. There is no ulcers or desquamation noted and she continues to apply moisturizer cream with excellent benefit.   She does not report any other side effects associated with Votrient. She did report some mild nausea but no vomiting. She does report mild diarrhea that is manageable by Imodium. She reports at worst she has 2-3 loose bowel movements a day. Her quality of life and performance status not dramatically changed. Her weight and appetite remain excellent and her quality of life is unchanged.   She does not report any headaches, blurry vision, syncope or seizures. She does not report any fevers, chills, sweats or decline in her energy. She does not report any chest pain, palpitation, orthopnea or leg edema. She does not report any cough, wheezing or hemoptysis. She does not report any nausea, vomiting, abdominal pain, hematochezia or melena. She does not report any frequency, urgency or hesitancy. She does not report any arthralgias or myalgias. She does not report any  lymphadenopathy or petechiae. Remaining review of systems unremarkable.    Medications: I have reviewed the patient's current medications.  Current Outpatient Prescriptions  Medication Sig Dispense Refill  . aspirin 81 MG tablet Take 81 mg by mouth daily. Reported on 06/14/2015    . clobetasol cream (TEMOVATE) [id number] % Apply 1 application topically 2 (two) times daily. 60 g 2  . clotrimazole-betamethasone (LOTRISONE) cream Apply to affected area 2 times daily    . fluticasone (FLONASE) 50 MCG/ACT nasal spray Place 2 sprays into the nose daily. (Patient taking differently: Place 2 sprays into the nose daily as needed for allergies. ) 16 g 6  . furosemide (LASIX) 20 MG tablet Take 20 mg by mouth.    . insulin glargine (LANTUS) 100 UNIT/ML injection Inject 46 Units into the skin at bedtime.     [name] losartan-hydrochlorothiazide (HYZAAR) 100-25 MG tablet Take 1 tablet by mouth daily.    . pazopanib (VOTRIENT) 200 MG tablet Take 4 tablets (800 mg total) by mouth daily. Take on an empty stomach. 120 tablet 0  . prochlorperazine (COMPAZINE) 10 MG tablet Take 1 tablet (10 mg total) by mouth every 6 (six) hours as needed for nausea or vomiting. 30 tablet 1  . RESTASIS 0.05 % ophthalmic emulsion     . traMADol (ULTRAM) 50 MG tablet Take 50 mg by mouth every 6 (six) hours as needed for moderate pain or severe pain.   0  . BYETTA 10 MCG PEN 10 MCG/0.04ML SOPN injection Inject 10 mg into the skin 2 (two) times daily.    . rosuvastatin (CRESTOR) 10  MG tablet      No current facility-administered medications for this visit.      Allergies:  Allergies  Allergen Reactions  . Codeine Rash    Past Medical History, Surgical history, Social history, and Family History were reviewed and updated.  [name] Physical Exam: Blood pressure 134/65, pulse 83, temperature 98 F (36.7 C), temperature source Oral, resp. rate 16, height 5\' 8"  (1.727 m), weight 215 lb 8 oz (97.8 kg), SpO2 97 %. ECOG: 0 General appearance:  Alert, awake woman without distress. Head: Normocephalic, without obvious abnormality no oral ulcers or lesions. Neck: no adenopathy Lymph nodes: Cervical, supraclavicular, and axillary nodes normal. Heart:regular rate and rhythm, S1, S2 normal, no murmur, click, rub or gallop Lung:chest clear, no wheezing, rales, normal symmetric air entry Abdomin: soft, non-tender, without masses or organomegaly no shifting dullness or ascites. EXT:. Erythema noted on her palms without any blisters or lesions. Her skin is moist without desquamation. No ulcers noted on her feet.   Lab Results: Lab Results  Component Value Date   WBC 7.1 11/28/2015   HGB 14.0 11/28/2015   HCT 42.4 11/28/2015   MCV 102.1 (H) 11/28/2015   PLT 334 11/28/2015     Chemistry      Component Value Date/Time   NA 142 11/28/2015 1035   K 4.3 11/28/2015 1035   CL 104 06/14/2015 1851   CO2 25 11/28/2015 1035   BUN 10.8 11/28/2015 1035   CREATININE 1.3 (H) 11/28/2015 1035      Component Value Date/Time   CALCIUM 9.5 11/28/2015 1035   ALKPHOS 82 11/28/2015 1035   AST 15 11/28/2015 1035   ALT 13 11/28/2015 1035   BILITOT 0.62 11/28/2015 1035      Impression and Plan:  65 year old woman with the following issues:  1. Renal mass representing with hypermetabolic lesion measuring 9.5 x 7.9 x 8.5 cm in the posterior left kidney. PET/CT scan showed lesions in the lung, adrenal and possible tumor thrombus in the left renal vein.  She is status post biopsy on 07/04/2015. The biopsy confirmed the presence of renal cell carcinoma.  She on Votrient at 800 mg daily since April 2017. CT scan on 09/20/2015 showed positive response to therapy. Her lung masses as well as lymphadenopathy is responding.   The plan is to continue with the same dose and schedule at this time and repeat imaging studies in 2 months.  2. Liver function surveillance: Her LFTs remain within normal range based on last check in July 2017.  3. Diarrhea  prophylaxis: She has Imodium with instructions how to use if she has recurrent diarrhea.  4. Renal insufficiency: Creatinine is close to baseline of 1.3  6. 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  7. Follow-up: Will be in one month to assess for any complications.   [place], MD 8/31/201711:09 AM

## 2015-12-09 ENCOUNTER — Other Ambulatory Visit: Payer: Self-pay | Admitting: *Deleted

## 2015-12-09 ENCOUNTER — Telehealth: Payer: Self-pay | Admitting: Pharmacist

## 2015-12-09 DIAGNOSIS — C649 Malignant neoplasm of unspecified kidney, except renal pelvis: Secondary | ICD-10-CM

## 2015-12-09 MED ORDER — PAZOPANIB HCL 200 MG PO TABS: 800.0000 mg | ORAL_TABLET | Freq: Every day | ORAL | 0 refills | Status: DC

## 2015-12-09 MED FILL — *VOTRIENT 200 MG TABLET: 200 | 30 days supply | Qty: 120 | Fill #0

## 2015-12-09 NOTE — Telephone Encounter (Signed)
Received call from patient stating that she needed a refill on her Votrient. She has enough medication to get through 12/11/15. She takes 800mg  daily. The refill request should go to [facility].  Left VM for Dr. Hazeline Junker RN regarding above information.  Raul Del, PharmD, [place], [facility] [phone]

## 2015-12-31 ENCOUNTER — Ambulatory Visit (HOSPITAL_BASED_OUTPATIENT_CLINIC_OR_DEPARTMENT_OTHER): Payer: Medicare Other | Admitting: Oncology

## 2015-12-31 ENCOUNTER — Other Ambulatory Visit (HOSPITAL_BASED_OUTPATIENT_CLINIC_OR_DEPARTMENT_OTHER): Payer: Medicare Other

## 2015-12-31 ENCOUNTER — Telehealth: Payer: Self-pay | Admitting: Oncology

## 2015-12-31 VITALS — BP 149/68 | HR 73 | Temp 97.6°F | Resp 17 | Ht 68.0 in | Wt 214.2 lb

## 2015-12-31 DIAGNOSIS — N289 Disorder of kidney and ureter, unspecified: Secondary | ICD-10-CM | POA: Diagnosis not present

## 2015-12-31 DIAGNOSIS — C7800 Secondary malignant neoplasm of unspecified lung: Secondary | ICD-10-CM | POA: Diagnosis not present

## 2015-12-31 DIAGNOSIS — C642 Malignant neoplasm of left kidney, except renal pelvis: Secondary | ICD-10-CM

## 2015-12-31 DIAGNOSIS — C7970 Secondary malignant neoplasm of unspecified adrenal gland: Secondary | ICD-10-CM | POA: Diagnosis not present

## 2015-12-31 DIAGNOSIS — C649 Malignant neoplasm of unspecified kidney, except renal pelvis: Secondary | ICD-10-CM

## 2015-12-31 DIAGNOSIS — L271 Localized skin eruption due to drugs and medicaments taken internally: Secondary | ICD-10-CM

## 2015-12-31 LAB — CBC WITH DIFFERENTIAL/PLATELET
BASO%: 0.7 % (ref 0.0–2.0)
Basophils Absolute: 0 10*3/uL (ref 0.0–0.1)
EOS%: 2.3 % (ref 0.0–7.0)
Eosinophils Absolute: 0.2 10*3/uL (ref 0.0–0.5)
HEMATOCRIT: 44.6 % (ref 34.8–46.6)
HGB: 14.7 g/dL (ref 11.6–15.9)
LYMPH#: 2.3 10*3/uL (ref 0.9–3.3)
LYMPH%: 33.6 % (ref 14.0–49.7)
MCH: 34 pg (ref 25.1–34.0)
MCHC: 33 g/dL (ref 31.5–36.0)
MCV: 103.1 fL — ABNORMAL HIGH (ref 79.5–101.0)
MONO#: 0.3 10*3/uL (ref 0.1–0.9)
MONO%: 4.3 % (ref 0.0–14.0)
NEUT%: 59.1 % (ref 38.4–76.8)
NEUTROS ABS: 4 10*3/uL (ref 1.5–6.5)
PLATELETS: 324 10*3/uL (ref 145–400)
RBC: 4.33 10*6/uL (ref 3.70–5.45)
RDW: 15.4 % — ABNORMAL HIGH (ref 11.2–14.5)
WBC: 6.8 10*3/uL (ref 3.9–10.3)

## 2015-12-31 LAB — COMPREHENSIVE METABOLIC PANEL
ALT: 14 U/L (ref 0–55)
ANION GAP: 10 meq/L (ref 3–11)
AST: 18 U/L (ref 5–34)
Albumin: 3 g/dL — ABNORMAL LOW (ref 3.5–5.0)
Alkaline Phosphatase: 79 U/L (ref 40–150)
BILIRUBIN TOTAL: 0.66 mg/dL (ref 0.20–1.20)
BUN: 11.9 mg/dL (ref 7.0–26.0)
CO2: 25 meq/L (ref 22–29)
CREATININE: 1.5 mg/dL — AB (ref 0.6–1.1)
Calcium: 9.2 mg/dL (ref 8.4–10.4)
Chloride: 107 mEq/L (ref 98–109)
EGFR: 43 mL/min/{1.73_m2} — ABNORMAL LOW (ref >90)
GLUCOSE: 145 mg/dL — AB (ref 70–140)
Potassium: 3.8 mEq/L (ref 3.5–5.1)
Sodium: 143 mEq/L (ref 136–145)
TOTAL PROTEIN: 7.3 g/dL (ref 6.4–8.3)

## 2015-12-31 NOTE — Progress Notes (Signed)
Hematology and Oncology Follow Up Visit  Laura Neal [id number] [birth year] 65 y.o. 12/31/2015 11:59 AM Laura Apple, MD   Principle Diagnosis: 65 year old woman with renal cell carcinoma. She presented with a 9.5 x 7.9 x 8.5 cm mass in the left kidney in March 2017. PET CT scan showed metastatic disease to the lung as well as the adrenal glands. This is biopsy proven to be renal cell carcinoma.   Prior Therapy: She is status post biopsy of her kidney mass which confirmed the presence of renal cell carcinoma on 07/04/2015.  Current therapy: Votrient 800 mg daily started on 07/16/2015. The dose will be reduced to 600 mg starting on 12/31/2015.  Interim History: Laura Neal presents today for a follow-up visit. Since the last visit, she continues to have issues with her right hip and she is scheduled to have surgery in January 2018.  She continues to tolerate Votrient without any new complications. She reports more discoloration and erythema in her palms and now soles of her feet. The erythema is associated with increased pain and tenderness. It is start to affect her quality of life and function. No desquamation noted.  She does not report any other side effects associated with Votrient. She did report some mild nausea but no vomiting. She does report mild diarrhea that is manageable by Imodium. She is reporting some increased fatigue and slight appetite decline.  She does not report any headaches, blurry vision, syncope or seizures. She does not report any fevers, chills, sweats or decline in her energy. She does not report any chest pain, palpitation, orthopnea or leg edema. She does not report any cough, wheezing or hemoptysis. She does not report any nausea, vomiting, abdominal pain, hematochezia or melena. She does not report any frequency, urgency or hesitancy. She does not report any arthralgias or myalgias. She does not report any lymphadenopathy or petechiae. Remaining  review of systems unremarkable.    Medications: I have reviewed the patient's current medications.  Current Outpatient Prescriptions  Medication Sig Dispense Refill  . aspirin 81 MG tablet Take 81 mg by mouth daily. Reported on 06/14/2015    . BYETTA 10 MCG PEN 10 MCG/0.04ML SOPN injection Inject 10 mg into the skin 2 (two) times daily.    . clobetasol cream (TEMOVATE) [id number] % Apply 1 application topically 2 (two) times daily. 60 g 2  . clotrimazole-betamethasone (LOTRISONE) cream Apply to affected area 2 times daily    . fluticasone (FLONASE) 50 MCG/ACT nasal spray Place 2 sprays into the nose daily. (Patient taking differently: Place 2 sprays into the nose daily as needed for allergies. ) 16 g 6  . furosemide (LASIX) 20 MG tablet Take 20 mg by mouth.    . insulin glargine (LANTUS) 100 UNIT/ML injection Inject 46 Units into the skin at bedtime.     Laura Neal losartan-hydrochlorothiazide (HYZAAR) 100-25 MG tablet Take 1 tablet by mouth daily.    . mometasone (ELOCON) 0.1 % cream Apply to affected area daily    . pazopanib (VOTRIENT) 200 MG tablet Take 4 tablets (800 mg total) by mouth daily. Take on an empty stomach. 120 tablet 0  . prochlorperazine (COMPAZINE) 10 MG tablet Take 1 tablet (10 mg total) by mouth every 6 (six) hours as needed for nausea or vomiting. 30 tablet 1  . RESTASIS 0.05 % ophthalmic emulsion     . rosuvastatin (CRESTOR) 10 MG tablet     . traMADol (ULTRAM) 50 MG tablet Take 50 mg by mouth  every 6 (six) hours as needed for moderate pain or severe pain.   0   No current facility-administered medications for this visit.      Allergies:  Allergies  Allergen Reactions  . Codeine Rash    Past Medical History, Surgical history, Social history, and Family History were reviewed and updated.  Laura Neal Physical Exam: Blood pressure (!) 149/68, pulse 73, temperature 97.6 F (36.4 C), temperature source Oral, resp. rate 17, height 5\' 8"  (1.727 m), weight 214 lb 3.2 oz (97.2 kg), SpO2 100  %. ECOG: 0 General appearance: A well-appearing woman without distress. Head: Normocephalic, without obvious abnormality no oral ulcers or lesions. Neck: no adenopathy Lymph nodes: Cervical, supraclavicular, and axillary nodes normal. Heart:regular rate and rhythm, S1, S2 normal, no murmur, click, rub or gallop Lung:chest clear, no wheezing, rales, normal symmetric air entry Abdomin: soft, non-tender, without masses or organomegaly no rebound or guarding. EXT:. Erythema noted on her palms without any blisters or lesions.    Lab Results: Lab Results  Component Value Date   WBC 6.8 12/31/2015   HGB 14.7 12/31/2015   HCT 44.6 12/31/2015   MCV 103.1 (H) 12/31/2015   PLT 324 12/31/2015     Chemistry      Component Value Date/Time   NA 142 11/28/2015 1035   K 4.3 11/28/2015 1035   CL 104 06/14/2015 1851   CO2 25 11/28/2015 1035   BUN 10.8 11/28/2015 1035   CREATININE 1.3 (H) 11/28/2015 1035      Component Value Date/Time   CALCIUM 9.5 11/28/2015 1035   ALKPHOS 82 11/28/2015 1035   AST 15 11/28/2015 1035   ALT 13 11/28/2015 1035   BILITOT 0.62 11/28/2015 1035      Impression and Plan:  65 year old woman with the following issues:  1. Renal mass representing with hypermetabolic lesion measuring 9.5 x 7.9 x 8.5 cm in the posterior left kidney. PET/CT scan showed lesions in the lung, adrenal and possible tumor thrombus in the left renal vein.  She is status post biopsy on 07/04/2015. The biopsy confirmed the presence of renal cell carcinoma.  She on Votrient at 800 mg daily since April 2017. CT scan on 09/20/2015 showed positive response to therapy. Her lung masses as well as lymphadenopathy is responding.   She is developing worsening complications at this time including hand-foot syndrome, fatigue and diarrhea. Based on these findings I recommended decreasing the dose to 600 mg daily and repeat CT scan before the next visit. This medication will be discontinued if her  disease progresses.  2. Liver function surveillance: Her LFTs remain within normal range based on laboratory testing in August 2017.  3. Diarrhea prophylaxis: She has Imodium with instructions how to use if she has recurrent diarrhea.  4. Renal insufficiency: Creatinine is close to baseline of 1.3  6. Hand-foot syndrome: Appears to have worsened slightly over a period of time and I have recommended reducing the Votrient dose. I also advised her to continue with supportive management as she is doing.  7. Follow-up: Will be in one month after repeat imaging studies.   [id number], MD 10/3/201711:59 AM

## 2015-12-31 NOTE — Telephone Encounter (Signed)
Gave patient avs report and appointments for October and November.  °

## 2016-01-10 ENCOUNTER — Other Ambulatory Visit: Payer: Self-pay | Admitting: *Deleted

## 2016-01-10 DIAGNOSIS — C649 Malignant neoplasm of unspecified kidney, except renal pelvis: Secondary | ICD-10-CM

## 2016-01-10 MED ORDER — PAZOPANIB HCL 200 MG PO TABS: 800.0000 mg | ORAL_TABLET | Freq: Every day | ORAL | 0 refills | Status: DC

## 2016-01-10 MED FILL — *VOTRIENT 200 MG TABLET: 200 | 30 days supply | Qty: 120 | Fill #0

## 2016-01-28 ENCOUNTER — Encounter (HOSPITAL_COMMUNITY): Payer: Self-pay

## 2016-01-28 ENCOUNTER — Other Ambulatory Visit (HOSPITAL_BASED_OUTPATIENT_CLINIC_OR_DEPARTMENT_OTHER): Payer: Medicare Other

## 2016-01-28 ENCOUNTER — Ambulatory Visit (HOSPITAL_COMMUNITY)
Admission: RE | Admit: 2016-01-28 | Discharge: 2016-01-28 | Disposition: A | Discharge status: Home / Self Care | Payer: Medicare Other | Source: Ambulatory Visit | Attending: Oncology | Admitting: Oncology

## 2016-01-28 DIAGNOSIS — C649 Malignant neoplasm of unspecified kidney, except renal pelvis: Secondary | ICD-10-CM

## 2016-01-28 DIAGNOSIS — C7970 Secondary malignant neoplasm of unspecified adrenal gland: Secondary | ICD-10-CM

## 2016-01-28 DIAGNOSIS — C799 Secondary malignant neoplasm of unspecified site: Secondary | ICD-10-CM | POA: Diagnosis present

## 2016-01-28 DIAGNOSIS — C7802 Secondary malignant neoplasm of left lung: Secondary | ICD-10-CM | POA: Diagnosis not present

## 2016-01-28 DIAGNOSIS — C7801 Secondary malignant neoplasm of right lung: Secondary | ICD-10-CM | POA: Diagnosis not present

## 2016-01-28 DIAGNOSIS — C7800 Secondary malignant neoplasm of unspecified lung: Secondary | ICD-10-CM | POA: Diagnosis not present

## 2016-01-28 DIAGNOSIS — I700 Atherosclerosis of aorta: Secondary | ICD-10-CM | POA: Insufficient documentation

## 2016-01-28 DIAGNOSIS — C642 Malignant neoplasm of left kidney, except renal pelvis: Secondary | ICD-10-CM | POA: Diagnosis not present

## 2016-01-28 LAB — CBC WITH DIFFERENTIAL/PLATELET
BASO%: 0.4 % (ref 0.0–2.0)
Basophils Absolute: 0 10*3/uL (ref 0.0–0.1)
EOS%: 1.4 % (ref 0.0–7.0)
Eosinophils Absolute: 0.1 10*3/uL (ref 0.0–0.5)
HEMATOCRIT: 43.3 % (ref 34.8–46.6)
HGB: 14 g/dL (ref 11.6–15.9)
LYMPH#: 2.6 10*3/uL (ref 0.9–3.3)
LYMPH%: 36.6 % (ref 14.0–49.7)
MCH: 34 pg (ref 25.1–34.0)
MCHC: 32.3 g/dL (ref 31.5–36.0)
MCV: 105.2 fL — ABNORMAL HIGH (ref 79.5–101.0)
MONO#: 0.4 10*3/uL (ref 0.1–0.9)
MONO%: 5.2 % (ref 0.0–14.0)
NEUT%: 56.4 % (ref 38.4–76.8)
NEUTROS ABS: 4 10*3/uL (ref 1.5–6.5)
PLATELETS: 314 10*3/uL (ref 145–400)
RBC: 4.12 10*6/uL (ref 3.70–5.45)
RDW: 15.6 % — ABNORMAL HIGH (ref 11.2–14.5)
WBC: 7.1 10*3/uL (ref 3.9–10.3)

## 2016-01-28 LAB — COMPREHENSIVE METABOLIC PANEL
ALT: 14 U/L (ref 0–55)
ANION GAP: 11 meq/L (ref 3–11)
AST: 16 U/L (ref 5–34)
Albumin: 3.3 g/dL — ABNORMAL LOW (ref 3.5–5.0)
Alkaline Phosphatase: 79 U/L (ref 40–150)
BILIRUBIN TOTAL: 0.58 mg/dL (ref 0.20–1.20)
BUN: 20 mg/dL (ref 7.0–26.0)
CO2: 25 meq/L (ref 22–29)
CREATININE: 1.5 mg/dL — AB (ref 0.6–1.1)
Calcium: 9.2 mg/dL (ref 8.4–10.4)
Chloride: 106 mEq/L (ref 98–109)
EGFR: 42 mL/min/{1.73_m2} — ABNORMAL LOW (ref >90)
Glucose: 58 mg/dl — ABNORMAL LOW (ref 70–140)
Potassium: 3.8 mEq/L (ref 3.5–5.1)
Sodium: 142 mEq/L (ref 136–145)
TOTAL PROTEIN: 7.4 g/dL (ref 6.4–8.3)

## 2016-01-28 MED ORDER — IOPAMIDOL (ISOVUE-300) INJECTION 61%
100.0000 mL | Freq: Once | INTRAVENOUS | Status: AC | PRN
  Administered 2016-01-28: 70 mL via INTRAVENOUS

## 2016-01-30 ENCOUNTER — Ambulatory Visit (HOSPITAL_BASED_OUTPATIENT_CLINIC_OR_DEPARTMENT_OTHER): Payer: Medicare Other | Admitting: Oncology

## 2016-01-30 ENCOUNTER — Telehealth: Payer: Self-pay | Admitting: Oncology

## 2016-01-30 VITALS — BP 117/60 | HR 81 | Temp 97.3°F | Resp 18 | Ht 68.0 in | Wt 216.3 lb

## 2016-01-30 DIAGNOSIS — C7970 Secondary malignant neoplasm of unspecified adrenal gland: Secondary | ICD-10-CM

## 2016-01-30 DIAGNOSIS — C7800 Secondary malignant neoplasm of unspecified lung: Secondary | ICD-10-CM | POA: Diagnosis not present

## 2016-01-30 DIAGNOSIS — C642 Malignant neoplasm of left kidney, except renal pelvis: Secondary | ICD-10-CM

## 2016-01-30 DIAGNOSIS — N289 Disorder of kidney and ureter, unspecified: Secondary | ICD-10-CM

## 2016-01-30 DIAGNOSIS — L271 Localized skin eruption due to drugs and medicaments taken internally: Secondary | ICD-10-CM

## 2016-01-30 DIAGNOSIS — C649 Malignant neoplasm of unspecified kidney, except renal pelvis: Secondary | ICD-10-CM

## 2016-01-30 NOTE — Telephone Encounter (Signed)
Appointments scheduled per 11/2 LOS. Patient given AVS report and calendars with future scheduled appointments. Appointment was to be scheduled 12/7 @ 9:15AM, that time was not available, scheduled patient for next available AM time. Reached out to desk nurse several times to confirm time change was ok. Couldn't reach nurse.

## 2016-01-30 NOTE — Progress Notes (Signed)
Hematology and Oncology Follow Up Visit  Laura Neal [id number] Jul 15, 1950 65 y.o. 01/30/2016 9:43 AM Laura Neal, Laura Brow, MD   Principle Diagnosis: 65 year old woman with renal cell carcinoma. She presented with a 9.5 x 7.9 x 8.5 cm mass in the left kidney in March 2017. PET CT scan showed metastatic disease to the lung as well as the adrenal glands. This is biopsy proven to be renal cell carcinoma.   Prior Therapy: She is status post biopsy of her kidney mass which confirmed the presence of renal cell carcinoma on 07/04/2015.  Current therapy: Votrient 800 mg daily started on 07/16/2015. The dose will be reduced to 600 mg starting on 12/31/2015.  Interim History: Laura Neal presents today for a follow-up visit. Since the last visit, she reports a better tolerance of Votrient at this reduced dose. Her hand-foot syndrome has improved and nearly resolved. She reports mild diarrhea and grade 1 fatigue which is manageable. She continues to have issues with her right hip and she is scheduled to have surgery in January 2018.  Her quality of life and performance status remains stable at this time.  She does not report any headaches, blurry vision, syncope or seizures. She does not report any fevers, chills, sweats or decline in her energy. She does not report any chest pain, palpitation, orthopnea or leg edema. She does not report any cough, wheezing or hemoptysis. She does not report any nausea, vomiting, abdominal pain, hematochezia or melena. She does not report any frequency, urgency or hesitancy. She does not report any arthralgias or myalgias. She does not report any lymphadenopathy or petechiae. Remaining review of systems unremarkable.    Medications: I have reviewed the patient's current medications.  Current Outpatient Prescriptions  Medication Sig Dispense Refill  . aspirin 81 MG tablet Take 81 mg by mouth daily. Reported on 06/14/2015    . BYETTA 10 MCG PEN 10 MCG/0.04ML SOPN  injection Inject 10 mg into the skin 2 (two) times daily.    . clobetasol cream (TEMOVATE) AB-123456789 % Apply 1 application topically 2 (two) times daily. 60 g 2  . clotrimazole-betamethasone (LOTRISONE) cream Apply to affected area 2 times daily    . fluticasone (FLONASE) 50 MCG/ACT nasal spray Place 2 sprays into the nose daily. (Patient taking differently: Place 2 sprays into the nose daily as needed for allergies. ) 16 g 6  . furosemide (LASIX) 20 MG tablet Take 20 mg by mouth.    . insulin glargine (LANTUS) 100 UNIT/ML injection Inject 46 Units into the skin at bedtime.     [name] losartan-hydrochlorothiazide (HYZAAR) 100-25 MG tablet Take 1 tablet by mouth daily.    . mometasone (ELOCON) 0.1 % cream Apply to affected area daily    . pazopanib (VOTRIENT) 200 MG tablet Take 4 tablets (800 mg total) by mouth daily. Take on an empty stomach. 120 tablet 0  . prochlorperazine (COMPAZINE) 10 MG tablet Take 1 tablet (10 mg total) by mouth every 6 (six) hours as needed for nausea or vomiting. 30 tablet 1  . RESTASIS 0.05 % ophthalmic emulsion     . rosuvastatin (CRESTOR) 10 MG tablet     . traMADol (ULTRAM) 50 MG tablet Take 50 mg by mouth every 6 (six) hours as needed for moderate pain or severe pain.   0   No current facility-administered medications for this visit.      Allergies:  Allergies  Allergen Reactions  . Codeine Rash    Past Medical History, Surgical  history, Social history, and Family History were reviewed and updated.  [name] Physical Exam: Blood pressure 117/60, pulse 81, temperature 97.3 F (36.3 C), temperature source Oral, resp. rate 18, height 5\' 8"  (1.727 m), weight 216 lb 4.8 oz (98.1 kg), SpO2 97 %. ECOG: 0 General appearance: Alert, awake woman without distress. Head: Normocephalic, without obvious abnormality no oral ulcers or lesions. Neck: no adenopathy Lymph nodes: Cervical, supraclavicular, and axillary nodes normal. Heart:regular rate and rhythm, S1, S2 normal, no  murmur, click, rub or gallop Lung:chest clear, no wheezing, rales, normal symmetric air entry Abdomin: soft, non-tender, without masses or organomegaly no shifting dullness or ascites. EXT:. Erythema noted on her palms have decreased dramatically.   Lab Results: Lab Results  Component Value Date   WBC 7.1 01/28/2016   HGB 14.0 01/28/2016   HCT 43.3 01/28/2016   MCV 105.2 (H) 01/28/2016   PLT 314 01/28/2016     Chemistry      Component Value Date/Time   NA 142 01/28/2016 1116   K 3.8 01/28/2016 1116   CL 104 06/14/2015 1851   CO2 25 01/28/2016 1116   BUN 20.0 01/28/2016 1116   CREATININE 1.5 (H) 01/28/2016 1116      Component Value Date/Time   CALCIUM 9.2 01/28/2016 1116   ALKPHOS 79 01/28/2016 1116   AST 16 01/28/2016 1116   ALT 14 01/28/2016 1116   BILITOT 0.58 01/28/2016 1116     EXAM: CT CHEST WITH CONTRAST  CT ABDOMEN AND PELVIS WITH AND WITHOUT CONTRAST  TECHNIQUE: Multidetector CT imaging of the chest was performed during intravenous contrast administration. Multidetector CT imaging of the abdomen and pelvis was performed following the standard protocol before and during bolus administration of intravenous contrast.  CONTRAST:  12mL ISOVUE-300 IOPAMIDOL (ISOVUE-300) INJECTION 61%  COMPARISON:  09/20/2015  FINDINGS: CT CHEST FINDINGS  Cardiovascular: The heart size is normal. There is aortic atherosclerosis. Calcification within the RCA, LAD and left circumflex coronary artery identified. No pericardial effusion.  Mediastinum/Nodes: The trachea appears patent and is midline. Normal appearance of the esophagus. Index AP window lymph node measures 11 mm, image 37 of series 5. Previously this measured 10 mm. Index left hilar lymph node measures 11 mm, image 51 of series 5. Previously this measured the same. No supraclavicular or axillary adenopathy.  Lungs/Pleura: No pleural effusion. Pulmonary nodules are again identified. Index nodule in the  right lower lobe measures 1.7 cm, image 48 of series 9. Previously this measured the same. Index nodule within the left lower lobe measures 8 mm, image 37 of series 9. Previously this measured the same. Index nodule in the left upper lobe measures 10 mm, image 17 of series 9. Previously 10 mm.  Musculoskeletal: No aggressive lytic or sclerotic bone lesions identified.  CT ABDOMEN AND PELVIS FINDINGS  Hepatobiliary: Stable low-density structures within the lateral segment of left lobe and caudate lobe of liver. Mild hepatic steatosis noted. The gallbladder is normal. No biliary dilatation.  Pancreas: Normal appearance of the pancreas.  Spleen: Normal in size without focal abnormality.  Adrenals/Urinary Tract: Normal appearance of the right adrenal gland. Status post right nephrectomy. Similar appearance of left adrenal nodule which measures 1.7 cm, image 23 of series 4. The cystic and solid mass involving the left mid kidney is again noted. This measures 7.6 x 5.7 cm, image 111 of series 5. Previously 7.9 x 5.7 cm. Tumor thrombus appears to extend into the left renal vein up to the junction with the IVC. Intermediate and  possibly enhancing lesion arising from the anterior cortex of the left kidney is stable measuring 1.9 cm, image 114 of series 5.  Stomach/Bowel: The stomach is normal. No pathologic dilatation of the small or large bowel loops.  Vascular/Lymphatic: Aortic atherosclerosis. Pre caval node measures 7 mm, image 129 of series 5.  Reproductive: Status post hysterectomy. No adnexal masses.  Other: No abdominal wall hernia or abnormality. No abdominopelvic ascites.  Musculoskeletal: No acute or significant osseous findings.  IMPRESSION: 1. No significant interval change in the appearance of pre-vascular mediastinal adenopathy and bilateral pulmonary metastases. 2. No significant change in size of left kidney renal cell carcinoma. 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 3. Aortic atherosclerosis 4. Stable left adrenal nodule.  Likely benign adenoma.    Impression and Plan:  65 year old woman with the following issues:  1. Renal mass representing with hypermetabolic lesion measuring 9.5 x 7.9 x 8.5 cm in the posterior left kidney. PET/CT scan showed lesions in the lung, adrenal and possible tumor thrombus in the left renal vein.  She is status post biopsy on 07/04/2015. The biopsy confirmed the presence of renal cell carcinoma.  She on Votrient at 800 mg daily since April 2017. CT scan on 09/20/2015 showed positive response to therapy. Her lung masses as well as lymphadenopathy is responding.   Her dose was reduced to 600 mg in October 2017.  CT scan obtain on 01/28/2016 was personally reviewed and discussed with the patient today. She continues to have stable disease at this time. I recommended continuing the same dose and schedule and repeat imaging studies in 3-4 months.  2. Liver function surveillance: Her LFTs remain within normal range based on laboratory testing in 11/28/2015.  3. Diarrhea prophylaxis: She has Imodium with instructions how to use if she has recurrent diarrhea.  4. Renal insufficiency: Creatinine is close to baseline of 1.5  6. Hand-foot syndrome: Improved dramatically with decreasing the dose of Votrient.  7. Follow-up: Will be in one month.   [facility], MD 11/2/20179:43 AM

## 2016-02-04 ENCOUNTER — Ambulatory Visit: Payer: Self-pay | Admitting: Orthopedic Surgery

## 2016-02-17 ENCOUNTER — Other Ambulatory Visit: Payer: Self-pay | Admitting: *Deleted

## 2016-02-17 DIAGNOSIS — C649 Malignant neoplasm of unspecified kidney, except renal pelvis: Secondary | ICD-10-CM

## 2016-02-17 MED ORDER — PAZOPANIB HCL 200 MG PO TABS: 800.0000 mg | ORAL_TABLET | Freq: Every day | ORAL | 0 refills | Status: DC

## 2016-02-17 MED FILL — VOTRIENT 200 MG TABLET: 200 | 30 days supply | Qty: 120 | Fill #0

## 2016-03-04 ENCOUNTER — Telehealth: Payer: Self-pay | Admitting: *Deleted

## 2016-03-04 ENCOUNTER — Other Ambulatory Visit: Payer: Self-pay | Admitting: Oncology

## 2016-03-04 DIAGNOSIS — C649 Malignant neoplasm of unspecified kidney, except renal pelvis: Secondary | ICD-10-CM

## 2016-03-04 NOTE — Telephone Encounter (Signed)
"  Call received for appointment reminder buttimes are different than what I had originally scheduled.  Camn you check this for me."   Tomorrow's appointments scheduled are Lab 11:45, F/U 12:15.  No further questions.

## 2016-03-05 ENCOUNTER — Ambulatory Visit (HOSPITAL_BASED_OUTPATIENT_CLINIC_OR_DEPARTMENT_OTHER): Payer: Medicare Other | Admitting: Oncology

## 2016-03-05 ENCOUNTER — Telehealth: Payer: Self-pay | Admitting: Oncology

## 2016-03-05 ENCOUNTER — Other Ambulatory Visit (HOSPITAL_BASED_OUTPATIENT_CLINIC_OR_DEPARTMENT_OTHER): Payer: Medicare Other

## 2016-03-05 VITALS — BP 136/59 | HR 71 | Temp 97.9°F | Resp 16 | Ht 68.0 in | Wt 216.0 lb

## 2016-03-05 DIAGNOSIS — C649 Malignant neoplasm of unspecified kidney, except renal pelvis: Secondary | ICD-10-CM

## 2016-03-05 DIAGNOSIS — C7970 Secondary malignant neoplasm of unspecified adrenal gland: Secondary | ICD-10-CM

## 2016-03-05 DIAGNOSIS — C7800 Secondary malignant neoplasm of unspecified lung: Secondary | ICD-10-CM

## 2016-03-05 DIAGNOSIS — L271 Localized skin eruption due to drugs and medicaments taken internally: Secondary | ICD-10-CM

## 2016-03-05 DIAGNOSIS — N289 Disorder of kidney and ureter, unspecified: Secondary | ICD-10-CM | POA: Diagnosis not present

## 2016-03-05 DIAGNOSIS — C642 Malignant neoplasm of left kidney, except renal pelvis: Secondary | ICD-10-CM

## 2016-03-05 LAB — COMPREHENSIVE METABOLIC PANEL
ALBUMIN: 3.2 g/dL — AB (ref 3.5–5.0)
ALK PHOS: 91 U/L (ref 40–150)
ALT: 11 U/L (ref 0–55)
ANION GAP: 10 meq/L (ref 3–11)
AST: 16 U/L (ref 5–34)
BILIRUBIN TOTAL: 0.69 mg/dL (ref 0.20–1.20)
BUN: 14.8 mg/dL (ref 7.0–26.0)
CALCIUM: 9.6 mg/dL (ref 8.4–10.4)
CO2: 28 meq/L (ref 22–29)
CREATININE: 1.4 mg/dL — AB (ref 0.6–1.1)
Chloride: 106 mEq/L (ref 98–109)
EGFR: 46 mL/min/{1.73_m2} — AB (ref >90)
Glucose: 73 mg/dl (ref 70–140)
Potassium: 3.9 mEq/L (ref 3.5–5.1)
Sodium: 145 mEq/L (ref 136–145)
TOTAL PROTEIN: 7.5 g/dL (ref 6.4–8.3)

## 2016-03-05 LAB — CBC WITH DIFFERENTIAL/PLATELET
BASO%: 0.1 % (ref 0.0–2.0)
Basophils Absolute: 0 10*3/uL (ref 0.0–0.1)
EOS ABS: 0.2 10*3/uL (ref 0.0–0.5)
EOS%: 2.7 % (ref 0.0–7.0)
HEMATOCRIT: 42.4 % (ref 34.8–46.6)
HEMOGLOBIN: 13.8 g/dL (ref 11.6–15.9)
LYMPH#: 2.4 10*3/uL (ref 0.9–3.3)
LYMPH%: 34.9 % (ref 14.0–49.7)
MCH: 33.7 pg (ref 25.1–34.0)
MCHC: 32.5 g/dL (ref 31.5–36.0)
MCV: 103.4 fL — AB (ref 79.5–101.0)
MONO#: 0.5 10*3/uL (ref 0.1–0.9)
MONO%: 6.9 % (ref 0.0–14.0)
NEUT%: 55.4 % (ref 38.4–76.8)
NEUTROS ABS: 3.9 10*3/uL (ref 1.5–6.5)
PLATELETS: 294 10*3/uL (ref 145–400)
RBC: 4.1 10*6/uL (ref 3.70–5.45)
RDW: 15.4 % — AB (ref 11.2–14.5)
WBC: 7 10*3/uL (ref 3.9–10.3)

## 2016-03-05 NOTE — Progress Notes (Signed)
Hematology and Oncology Follow Up Visit  Laura Neal [id number] 1950/04/06 65 y.o. 03/05/2016 12:44 PM Laura Neal, Laura Brow, MD   Principle Diagnosis: 65 year old woman with renal cell carcinoma. She presented with a 9.5 x 7.9 x 8.5 cm mass in the left kidney in March 2017. PET CT scan showed metastatic disease to the lung as well as the adrenal glands. This is biopsy proven to be renal cell carcinoma.   Prior Therapy: She is status post biopsy of her kidney mass which confirmed the presence of renal cell carcinoma on 07/04/2015.  Current therapy: Votrient 800 mg daily started on 07/16/2015. The dose will be reduced to 600 mg starting on 12/31/2015.  Interim History: Ms. Laura Neal presents today for a follow-up visit. Since the last visit, she reports no major changes in her health. She continues to enjoy excellent quality of life without any new symptoms. Her appetite is better and her hand-foot syndrome is improving. She continues to tolerate Votrient at 600 mg daily much better. She reports mild diarrhea and grade 1 fatigue which is manageable. She continues to have issues with her right hip and she is scheduled to have surgery in January 2018. She continues to be ambulatory and attends to activities of daily living.  She does not report any headaches, blurry vision, syncope or seizures. She does not report any fevers, chills, sweats or decline in her energy. She does not report any chest pain, palpitation, orthopnea or leg edema. She does not report any cough, wheezing or hemoptysis. She does not report any nausea, vomiting, abdominal pain, hematochezia or melena. She does not report any frequency, urgency or hesitancy. She does not report any arthralgias or myalgias. She does not report any lymphadenopathy or petechiae. Remaining review of systems unremarkable.    Medications: I have reviewed the patient's current medications.  Current Outpatient Prescriptions  Medication Sig  Dispense Refill  . aspirin 81 MG tablet Take 81 mg by mouth daily. Reported on 06/14/2015    . BYETTA 10 MCG PEN 10 MCG/0.04ML SOPN injection Inject 10 mg into the skin 2 (two) times daily.    . clobetasol cream (TEMOVATE) AB-123456789 % Apply 1 application topically 2 (two) times daily. 60 g 2  . clotrimazole-betamethasone (LOTRISONE) cream Apply to affected area 2 times daily    . fluticasone (FLONASE) 50 MCG/ACT nasal spray Place 2 sprays into the nose daily. (Patient taking differently: Place 2 sprays into the nose daily as needed for allergies. ) 16 g 6  . furosemide (LASIX) 20 MG tablet Take 20 mg by mouth.    . insulin glargine (LANTUS) 100 UNIT/ML injection Inject 46 Units into the skin at bedtime.     [name] losartan-hydrochlorothiazide (HYZAAR) 100-25 MG tablet Take 1 tablet by mouth daily.    . mometasone (ELOCON) 0.1 % cream Apply to affected area daily    . pazopanib (VOTRIENT) 200 MG tablet Take 4 tablets (800 mg total) by mouth daily. Take on an empty stomach. 120 tablet 0  . prochlorperazine (COMPAZINE) 10 MG tablet Take 1 tablet (10 mg total) by mouth every 6 (six) hours as needed for nausea or vomiting. 30 tablet 1  . RESTASIS 0.05 % ophthalmic emulsion     . rosuvastatin (CRESTOR) 10 MG tablet     . traMADol (ULTRAM) 50 MG tablet Take 50 mg by mouth every 6 (six) hours as needed for moderate pain or severe pain.   0   No current facility-administered medications for this visit.  Allergies:  Allergies  Allergen Reactions  . Codeine Rash    Past Medical History, Surgical history, Social history, and Family History were reviewed and updated.  [name] Physical Exam: Blood pressure (!) 136/59, pulse 71, temperature 97.9 F (36.6 C), temperature source Oral, resp. rate 16, height 5\' 8"  (1.727 m), weight 216 lb (98 kg), SpO2 99 %. ECOG: 0 General appearance: Well-appearing woman without distress. Head: Normocephalic, without obvious abnormality no oral thrush noted. Neck: no  adenopathy Lymph nodes: Cervical, supraclavicular, and axillary nodes normal. Heart:regular rate and rhythm, S1, S2 normal, no murmur, click, rub or gallop Lung:chest clear, no wheezing, rales, normal symmetric air entry Abdomin: soft, non-tender, without masses or organomegaly no rebound or guarding. EXT: Erythema noted on her palms have decreased dramatically.   Lab Results: Lab Results  Component Value Date   WBC 7.0 03/05/2016   HGB 13.8 03/05/2016   HCT 42.4 03/05/2016   MCV 103.4 (H) 03/05/2016   PLT 294 03/05/2016     Chemistry      Component Value Date/Time   NA 145 03/05/2016 1157   K 3.9 03/05/2016 1157   CL 104 06/14/2015 1851   CO2 28 03/05/2016 1157   BUN 14.8 03/05/2016 1157   CREATININE 1.4 (H) 03/05/2016 1157      Component Value Date/Time   CALCIUM 9.6 03/05/2016 1157   ALKPHOS 91 03/05/2016 1157   AST 16 03/05/2016 1157   ALT 11 03/05/2016 1157   BILITOT 0.69 03/05/2016 1157     Impression and Plan:  65 year old woman with the following issues:  1. Renal mass representing with hypermetabolic lesion measuring 9.5 x 7.9 x 8.5 cm in the posterior left kidney. PET/CT scan showed lesions in the lung, adrenal and possible tumor thrombus in the left renal vein.  She is status post biopsy on 07/04/2015. The biopsy confirmed the presence of renal cell carcinoma.  She on Votrient at 800 mg daily since April 2017. CT scan on 09/20/2015 showed positive response to therapy. Her lung masses as well as lymphadenopathy is responding.   Her dose was reduced to 600 mg in October 2017.  CT scan obtain on 01/28/2016 showed stable disease.  Comparison continue the same dose and schedule and repeat imaging studies in 3 months.  2. Liver function surveillance: Her LFTs remain within normal range based on repeated labs in December 2017.  3. Diarrhea prophylaxis: She has Imodium with instructions how to use if she has recurrent diarrhea.  4. Renal insufficiency:  Creatinine is close to baseline of 1.5  6. Hand-foot syndrome: Improved dramatically with decreasing the dose of Votrient.  7. Follow-up: Will be in one month.   Zola Button, MD 12/7/201712:44 PM

## 2016-03-05 NOTE — Telephone Encounter (Signed)
Appointments scheduled per 03/05/16 los. A copy of the AVS report and appointment schedule was given to patient per 03/05/16 los.

## 2016-03-26 ENCOUNTER — Encounter (HOSPITAL_COMMUNITY): Payer: Self-pay | Admitting: *Deleted

## 2016-03-30 DIAGNOSIS — Z9289 Personal history of other medical treatment: Secondary | ICD-10-CM

## 2016-03-30 HISTORY — DX: Personal history of other medical treatment: Z92.89

## 2016-03-31 ENCOUNTER — Other Ambulatory Visit: Payer: Self-pay | Admitting: *Deleted

## 2016-03-31 DIAGNOSIS — C649 Malignant neoplasm of unspecified kidney, except renal pelvis: Secondary | ICD-10-CM

## 2016-03-31 MED ORDER — PAZOPANIB HCL 200 MG PO TABS: 600.0000 mg | ORAL_TABLET | Freq: Every day | ORAL | 0 refills | Status: DC

## 2016-03-31 MED FILL — VOTRIENT 200 MG TABLET: 200 | 30 days supply | Qty: 90 | Fill #0

## 2016-04-09 ENCOUNTER — Ambulatory Visit (HOSPITAL_BASED_OUTPATIENT_CLINIC_OR_DEPARTMENT_OTHER): Payer: Medicare Other | Admitting: Oncology

## 2016-04-09 ENCOUNTER — Other Ambulatory Visit (HOSPITAL_BASED_OUTPATIENT_CLINIC_OR_DEPARTMENT_OTHER): Payer: Medicare Other

## 2016-04-09 ENCOUNTER — Telehealth: Payer: Self-pay | Admitting: Oncology

## 2016-04-09 VITALS — BP 134/62 | HR 78 | Temp 98.7°F | Resp 18 | Ht 68.0 in | Wt 214.7 lb

## 2016-04-09 DIAGNOSIS — C649 Malignant neoplasm of unspecified kidney, except renal pelvis: Secondary | ICD-10-CM

## 2016-04-09 DIAGNOSIS — C7970 Secondary malignant neoplasm of unspecified adrenal gland: Secondary | ICD-10-CM

## 2016-04-09 DIAGNOSIS — C642 Malignant neoplasm of left kidney, except renal pelvis: Secondary | ICD-10-CM

## 2016-04-09 DIAGNOSIS — C7800 Secondary malignant neoplasm of unspecified lung: Secondary | ICD-10-CM

## 2016-04-09 DIAGNOSIS — N289 Disorder of kidney and ureter, unspecified: Secondary | ICD-10-CM | POA: Diagnosis not present

## 2016-04-09 DIAGNOSIS — L271 Localized skin eruption due to drugs and medicaments taken internally: Secondary | ICD-10-CM

## 2016-04-09 LAB — CBC WITH DIFFERENTIAL/PLATELET
BASO%: 0.5 % (ref 0.0–2.0)
Basophils Absolute: 0 10*3/uL (ref 0.0–0.1)
EOS ABS: 0.2 10*3/uL (ref 0.0–0.5)
EOS%: 2.7 % (ref 0.0–7.0)
HCT: 42 % (ref 34.8–46.6)
HEMOGLOBIN: 13.7 g/dL (ref 11.6–15.9)
LYMPH%: 41.9 % (ref 14.0–49.7)
MCH: 33.5 pg (ref 25.1–34.0)
MCHC: 32.7 g/dL (ref 31.5–36.0)
MCV: 102.4 fL — AB (ref 79.5–101.0)
MONO#: 0.3 10*3/uL (ref 0.1–0.9)
MONO%: 4.8 % (ref 0.0–14.0)
NEUT%: 50.1 % (ref 38.4–76.8)
NEUTROS ABS: 3.4 10*3/uL (ref 1.5–6.5)
Platelets: 327 10*3/uL (ref 145–400)
RBC: 4.1 10*6/uL (ref 3.70–5.45)
RDW: 15.9 % — ABNORMAL HIGH (ref 11.2–14.5)
WBC: 6.9 10*3/uL (ref 3.9–10.3)
lymph#: 2.9 10*3/uL (ref 0.9–3.3)

## 2016-04-09 LAB — COMPREHENSIVE METABOLIC PANEL
ALBUMIN: 3.2 g/dL — AB (ref 3.5–5.0)
ALK PHOS: 86 U/L (ref 40–150)
ALT: 10 U/L (ref 0–55)
AST: 18 U/L (ref 5–34)
Anion Gap: 9 mEq/L (ref 3–11)
BILIRUBIN TOTAL: 0.63 mg/dL (ref 0.20–1.20)
BUN: 17 mg/dL (ref 7.0–26.0)
CO2: 27 mEq/L (ref 22–29)
Calcium: 9.3 mg/dL (ref 8.4–10.4)
Chloride: 108 mEq/L (ref 98–109)
Creatinine: 1.4 mg/dL — ABNORMAL HIGH (ref 0.6–1.1)
EGFR: 46 mL/min/{1.73_m2} — AB (ref >90)
GLUCOSE: 94 mg/dL (ref 70–140)
Potassium: 3.5 mEq/L (ref 3.5–5.1)
SODIUM: 143 meq/L (ref 136–145)
TOTAL PROTEIN: 7.1 g/dL (ref 6.4–8.3)

## 2016-04-09 NOTE — Progress Notes (Signed)
Hematology and Oncology Follow Up Visit  KRISTLYN MINNIFIELD [id number] 1950/11/19 66 y.o. 04/09/2016 9:39 AM [name], MD   Principle Diagnosis: 66 year old woman with renal cell carcinoma. She presented with a 9.5 x 7.9 x 8.5 cm mass in the left kidney in March 2017. PET CT scan showed metastatic disease to the lung as well as the adrenal glands. This is biopsy proven to be renal cell carcinoma.   Prior Therapy: She is status post biopsy of her kidney mass which confirmed the presence of renal cell carcinoma on 07/04/2015.  Current therapy: Votrient 800 mg daily started on 07/16/2015. The dose will be reduced to 600 mg starting on 12/31/2015.  Interim History: Ms. Rucks presents today for a follow-up visit. Since the last visit, she continues to report any hip pain. She is planning to have surgery in the next few weeks for a total hip replacement. She continues to tolerate Votrient at 600 mg daily much better. She reports mild diarrhea and grade 1 fatigue which is manageable. She continues to be ambulatory and attends to activities of daily living. She denied any falls or syncope. She denied any respiratory symptoms or change in her quality of life. She has no difficulties obtaining Votrient or taking it at this time. He has been adherent to this medication.  She does not report any headaches, blurry vision, syncope or seizures. She does not report any fevers, chills, sweats or decline in her energy. She does not report any chest pain, palpitation, orthopnea or leg edema. She does not report any cough, wheezing or hemoptysis. She does not report any nausea, vomiting, abdominal pain, hematochezia or melena. She does not report any frequency, urgency or hesitancy. She does not report any arthralgias or myalgias. She does not report any lymphadenopathy or petechiae. Remaining review of systems unremarkable.    Medications: I have reviewed the patient's current medications.  Current  Outpatient Prescriptions  Medication Sig Dispense Refill  . aspirin 81 MG tablet Take 81 mg by mouth daily. Reported on 06/14/2015    . BYETTA 10 MCG PEN 10 MCG/0.04ML SOPN injection Inject 10 mg into the skin 2 (two) times daily.    . clobetasol cream (TEMOVATE) [id number] % Apply 1 application topically 2 (two) times daily. 60 g 2  . clotrimazole-betamethasone (LOTRISONE) cream Apply to affected area 2 times daily    . fluticasone (FLONASE) 50 MCG/ACT nasal spray Place 2 sprays into the nose daily. (Patient taking differently: Place 2 sprays into the nose daily as needed for allergies. ) 16 g 6  . furosemide (LASIX) 20 MG tablet Take 20 mg by mouth.    . insulin glargine (LANTUS) 100 UNIT/ML injection Inject 46 Units into the skin at bedtime.     [name] losartan-hydrochlorothiazide (HYZAAR) 100-25 MG tablet Take 1 tablet by mouth daily.    . mometasone (ELOCON) 0.1 % cream Apply to affected area daily    . pazopanib (VOTRIENT) 200 MG tablet Take 3 tablets (600 mg total) by mouth daily. Take on an empty stomach. 90 tablet 0  . prochlorperazine (COMPAZINE) 10 MG tablet Take 1 tablet (10 mg total) by mouth every 6 (six) hours as needed for nausea or vomiting. 30 tablet 1  . RESTASIS 0.05 % ophthalmic emulsion     . rosuvastatin (CRESTOR) 10 MG tablet     . traMADol (ULTRAM) 50 MG tablet Take 50 mg by mouth every 6 (six) hours as needed for moderate pain or severe pain.  0   No current facility-administered medications for this visit.      Allergies:  Allergies  Allergen Reactions  . Codeine Rash    Past Medical History, Surgical history, Social history, and Family History were reviewed and updated.  [name] Physical Exam: Blood pressure 134/62, pulse 78, temperature 98.7 F (37.1 C), temperature source Oral, resp. rate 18, height 5\' 8"  (1.727 m), weight 214 lb 11.2 oz (97.4 kg), SpO2 100 %. ECOG: 0 General appearance: Well-appearing woman without distress. Head: Normocephalic, without obvious  abnormality no oral ulcers or lesions. Neck: no adenopathy Lymph nodes: Cervical, supraclavicular, and axillary nodes normal. Heart:regular rate and rhythm, S1, S2 normal, no murmur, click, rub or gallop Lung:chest clear, no wheezing, rales, normal symmetric air entry Abdomin: soft, non-tender, without masses or organomegaly no rebound or guarding. EXT: Very little to no erythema noted on her palms.   Lab Results: Lab Results  Component Value Date   WBC 6.9 04/09/2016   HGB 13.7 04/09/2016   HCT 42.0 04/09/2016   MCV 102.4 (H) 04/09/2016   PLT 327 04/09/2016     Chemistry      Component Value Date/Time   NA 145 03/05/2016 1157   K 3.9 03/05/2016 1157   CL 104 06/14/2015 1851   CO2 28 03/05/2016 1157   BUN 14.8 03/05/2016 1157   CREATININE 1.4 (H) 03/05/2016 1157      Component Value Date/Time   CALCIUM 9.6 03/05/2016 1157   ALKPHOS 91 03/05/2016 1157   AST 16 03/05/2016 1157   ALT 11 03/05/2016 1157   BILITOT 0.69 03/05/2016 1157     Impression and Plan:  66 year old woman with the following issues:  1. Renal mass representing with hypermetabolic lesion measuring 9.5 x 7.9 x 8.5 cm in the posterior left kidney. PET/CT scan showed lesions in the lung, adrenal and possible tumor thrombus in the left renal vein.  She is status post biopsy on 07/04/2015. The biopsy confirmed the presence of renal cell carcinoma.  She on Votrient at 800 mg daily since April 2017. Her dose was reduced to 600 mg in October 2017.She tolerated dose much better.  CT scan obtain on 01/28/2016 showed stable disease.  The prostate continue the same dose and schedule given her excellent tolerance and her stable CT scan. The plan is to repeat imaging studies in March 2018.  2. Liver function surveillance: Her LFTs remain within normal range and we will continue to be monitored periodically. Her liver function tests within normal range on 03/05/2016.  3. Diarrhea prophylaxis: She has Imodium with  instructions how to use if she has recurrent diarrhea.  4. Renal insufficiency: Creatinine is close to baseline of 1.4.   6. Hand-foot syndrome: Improved dramatically with decreasing the dose of Votrient.  7. Hip surgery: I see no objections or contraindication related to her surgery. I instructed her to continue to take Votrient unless any complications happen after surgery.  8. Follow-up: Will be in February 2018.   [facility], MD 1/11/20189:39 AM

## 2016-04-09 NOTE — Telephone Encounter (Signed)
Gave patient avs report and appointments for February.  °

## 2016-04-15 NOTE — Patient Instructions (Signed)
Laura Neal  04/15/2016   Your procedure is scheduled on: 04/22/2016   Report to [facility] Main  Entrance take [facility]  elevators to 3rd floor to  [place] at   200pm  Call this number if you have problems the morning of surgery [phone]   Remember: ONLY 1 PERSON MAY GO WITH YOU TO SHORT STAY TO GET  READY MORNING OF YOUR SURGERY.  Do not eat food or drink liquids :After Midnight.     Take these medicines the morning of surgery with A SIP OF WATER: Flonase if needed, Votrient  DO NOT TAKE ANY DIABETIC MEDICATIONS DAY OF YOUR SURGERY                               You may not have any metal on your body including hair pins and              piercings  Do not wear jewelry, make-up, lotions, powders or perfumes, deodorant             Do not wear nail polish.  Do not shave  48 hours prior to surgery.                 Do not bring valuables to the hospital. [place].  Contacts, dentures or bridgework may not be worn into surgery.  Leave suitcase in the car. After surgery it may be brought to your room.    How to Manage Your Diabetes Before and After Surgery  Why is it important to control my blood sugar before and after surgery? . Improving blood sugar levels before and after surgery helps healing and can limit problems. . A way of improving blood sugar control is eating a healthy diet by: o  Eating less sugar and carbohydrates o  Increasing activity/exercise o  Talking with your doctor about reaching your blood sugar goals . High blood sugars (greater than 180 mg/dL) can raise your risk of infections and slow your recovery, so you will need to focus on controlling your diabetes during the weeks before surgery. . Make sure that the doctor who takes care of your diabetes knows about your planned surgery including the date and location.  How do I manage my blood sugar before surgery? . Check your  blood sugar at least 4 times a day, starting 2 days before surgery, to make sure that the level is not too high or low. o Check your blood sugar the morning of your surgery when you wake up and every 2 hours until you get to the Short Stay unit. . If your blood sugar is less than 70 mg/dL, you will need to treat for low blood sugar: o Do not take insulin. o Treat a low blood sugar (less than 70 mg/dL) with  cup of clear juice (cranberry or apple), 4 glucose tablets, OR glucose gel. o Recheck blood sugar in 15 minutes after treatment (to make sure it is greater than 70 mg/dL). If your blood sugar is not greater than 70 mg/dL on recheck, call [phone] for further instructions. . Report your blood sugar to the short stay nurse when you get to Short Stay.  . If you are admitted to the hospital  after surgery: o Your blood sugar will be checked by the staff and you will probably be given insulin after surgery (instead of oral diabetes medicines) to make sure you have good blood sugar levels. o The goal for blood sugar control after surgery is 80-180 mg/dL.   WHAT DO I DO ABOUT MY DIABETES MEDICATION?  [name] Do not take oral diabetes medicines (pills) the morning of surgery.  . THE NIGHT BEFORE SURGERY, take __1/2 dose _________ units of ___lantus ________insulin.      .   . The day of surgery, do not take other diabetes injectables, including Byetta (exenatide), Bydureon (exenatide ER), Victoza (liraglutide), or Trulicity (dulaglutide). .  .      Patient Signature:  Date:   Nurse Signature:  Date:   Reviewed and Endorsed by [facility] Patient Education Committee, August 2015 Special Instructions: N/A              Please read over the following fact sheets you were given: _____________________________________________________________________                CLEAR LIQUID DIET   Foods Allowed                                                                     Foods  Excluded  Coffee and tea, regular and decaf                             liquids that you cannot  Plain Jell-O in any flavor                                             see through such as: Fruit ices (not with fruit pulp)                                     milk, soups, orange juice  Iced Popsicles                                    All solid food Carbonated beverages, regular and diet                                    Cranberry, grape and apple juices Sports drinks like Gatorade Lightly seasoned clear broth or consume(fat free) Sugar, honey syrup  Sample Menu Breakfast                                Lunch                                     Supper Cranberry juice                    Beef broth  Chicken broth Jell-O                                     Grape juice                           Apple juice Coffee or tea                        Jell-O                                      Popsicle                                                Coffee or tea                        Coffee or tea  _____________________________________________________________________  [facility] - Preparing for Surgery Before surgery, you can play an important role.  Because skin is not sterile, your skin needs to be as free of germs as possible.  You can reduce the number of germs on your skin by washing with CHG (chlorahexidine gluconate) soap before surgery.  CHG is an antiseptic cleaner which kills germs and bonds with the skin to continue killing germs even after washing. Please DO NOT use if you have an allergy to CHG or antibacterial soaps.  If your skin becomes reddened/irritated stop using the CHG and inform your nurse when you arrive at Short Stay. Do not shave (including legs and underarms) for at least 48 hours prior to the first CHG shower.  You may shave your face/neck. Please follow these instructions carefully:  1.  Shower with CHG Soap the night before surgery and the  morning  of Surgery.  2.  If you choose to wash your hair, wash your hair first as usual with your  normal  shampoo.  3.  After you shampoo, rinse your hair and body thoroughly to remove the  shampoo.                           4.  Use CHG as you would any other liquid soap.  You can apply chg directly  to the skin and wash                       Gently with a scrungie or clean washcloth.  5.  Apply the CHG Soap to your body ONLY FROM THE NECK DOWN.   Do not use on face/ open                           Wound or open sores. Avoid contact with eyes, ears mouth and genitals (private parts).                       Wash face,  Genitals (private parts) with your normal soap.             6.  Wash thoroughly, paying special attention to the area  where your surgery  will be performed.  7.  Thoroughly rinse your body with warm water from the neck down.  8.  DO NOT shower/wash with your normal soap after using and rinsing off  the CHG Soap.                9.  Pat yourself dry with a clean towel.            10.  Wear clean pajamas.            11.  Place clean sheets on your bed the night of your first shower and do not  sleep with pets. Day of Surgery : Do not apply any lotions/deodorants the morning of surgery.  Please wear clean clothes to the hospital/surgery center.  FAILURE TO FOLLOW THESE INSTRUCTIONS MAY RESULT IN THE CANCELLATION OF YOUR SURGERY PATIENT SIGNATURE_________________________________  NURSE SIGNATURE__________________________________  ________________________________________________________________________  WHAT IS A BLOOD TRANSFUSION? Blood Transfusion Information  A transfusion is the replacement of blood or some of its parts. Blood is made up of multiple cells which provide different functions.  Red blood cells carry oxygen and are used for blood loss replacement.  White blood cells fight against infection.  Platelets control bleeding.  Plasma helps clot blood.  Other blood products  are available for specialized needs, such as hemophilia or other clotting disorders. BEFORE THE TRANSFUSION  Who gives blood for transfusions?   Healthy volunteers who are fully evaluated to make sure their blood is safe. This is blood bank blood. Transfusion therapy is the safest it has ever been in the practice of medicine. Before blood is taken from a donor, a complete history is taken to make sure that person has no history of diseases nor engages in risky social behavior (examples are intravenous drug use or sexual activity with multiple partners). The donor's travel history is screened to minimize risk of transmitting infections, such as malaria. The donated blood is tested for signs of infectious diseases, such as HIV and hepatitis. The blood is then tested to be sure it is compatible with you in order to minimize the chance of a transfusion reaction. If you or a relative donates blood, this is often done in anticipation of surgery and is not appropriate for emergency situations. It takes many days to process the donated blood. RISKS AND COMPLICATIONS Although transfusion therapy is very safe and saves many lives, the main dangers of transfusion include:   Getting an infectious disease.  Developing a transfusion reaction. This is an allergic reaction to something in the blood you were given. Every precaution is taken to prevent this. The decision to have a blood transfusion has been considered carefully by your caregiver before blood is given. Blood is not given unless the benefits outweigh the risks. AFTER THE TRANSFUSION  Right after receiving a blood transfusion, you will usually feel much better and more energetic. This is especially true if your red blood cells have gotten low (anemic). The transfusion raises the level of the red blood cells which carry oxygen, and this usually causes an energy increase.  The nurse administering the transfusion will monitor you carefully for  complications. HOME CARE INSTRUCTIONS  No special instructions are needed after a transfusion. You may find your energy is better. Speak with your caregiver about any limitations on activity for underlying diseases you may have. SEEK MEDICAL CARE IF:   Your condition is not improving after your transfusion.  You develop redness or irritation at the intravenous (  IV) site. SEEK IMMEDIATE MEDICAL CARE IF:  Any of the following symptoms occur over the next 12 hours:  Shaking chills.  You have a temperature by mouth above 102 F (38.9 C), not controlled by medicine.  Chest, back, or muscle pain.  People around you feel you are not acting correctly or are confused.  Shortness of breath or difficulty breathing.  Dizziness and fainting.  You get a rash or develop hives.  You have a decrease in urine output.  Your urine turns a dark color or changes to pink, red, or brown. Any of the following symptoms occur over the next 10 days:  You have a temperature by mouth above 102 F (38.9 C), not controlled by medicine.  Shortness of breath.  Weakness after normal activity.  The white part of the eye turns yellow (jaundice).  You have a decrease in the amount of urine or are urinating less often.  Your urine turns a dark color or changes to pink, red, or brown. Document Released: 03/13/2000 Document Revised: 06/08/2011 Document Reviewed: 10/31/2007 ExitCare Patient Information 2014 [place].  _______________________________________________________________________  Incentive Spirometer  An incentive spirometer is a tool that can help keep your lungs clear and active. This tool measures how well you are filling your lungs with each breath. Taking long deep breaths may help reverse or decrease the chance of developing breathing (pulmonary) problems (especially infection) following:  A long period of time when you are unable to move or be active. BEFORE THE PROCEDURE   If  the spirometer includes an indicator to show your best effort, your nurse or respiratory therapist will set it to a desired goal.  If possible, sit up straight or lean slightly forward. Try not to slouch.  Hold the incentive spirometer in an upright position. INSTRUCTIONS FOR USE  1. Sit on the edge of your bed if possible, or sit up as far as you can in bed or on a chair. 2. Hold the incentive spirometer in an upright position. 3. Breathe out normally. 4. Place the mouthpiece in your mouth and seal your lips tightly around it. 5. Breathe in slowly and as deeply as possible, raising the piston or the ball toward the top of the column. 6. Hold your breath for 3-5 seconds or for as long as possible. Allow the piston or ball to fall to the bottom of the column. 7. Remove the mouthpiece from your mouth and breathe out normally. 8. Rest for a few seconds and repeat Steps 1 through 7 at least 10 times every 1-2 hours when you are awake. Take your time and take a few normal breaths between deep breaths. 9. The spirometer may include an indicator to show your best effort. Use the indicator as a goal to work toward during each repetition. 10. After each set of 10 deep breaths, practice coughing to be sure your lungs are clear. If you have an incision (the cut made at the time of surgery), support your incision when coughing by placing a pillow or rolled up towels firmly against it. Once you are able to get out of bed, walk around indoors and cough well. You may stop using the incentive spirometer when instructed by your caregiver.  RISKS AND COMPLICATIONS  Take your time so you do not get dizzy or light-headed.  If you are in pain, you may need to take or ask for pain medication before doing incentive spirometry. It is harder to take a deep breath if you are  having pain. AFTER USE  Rest and breathe slowly and easily.  It can be helpful to keep track of a log of your progress. Your caregiver can  provide you with a simple table to help with this. If you are using the spirometer at home, follow these instructions: [place] IF:   You are having difficultly using the spirometer.  You have trouble using the spirometer as often as instructed.  Your pain medication is not giving enough relief while using the spirometer.  You develop fever of 100.5 F (38.1 C) or higher. SEEK IMMEDIATE MEDICAL CARE IF:   You cough up bloody sputum that had not been present before.  You develop fever of 102 F (38.9 C) or greater.  You develop worsening pain at or near the incision site. MAKE SURE YOU:   Understand these instructions.  Will watch your condition.  Will get help right away if you are not doing well or get worse. Document Released: 07/27/2006 Document Revised: 06/08/2011 Document Reviewed: 09/27/2006 ExitCare Patient Information 2014 ExitCare, [place].   ________________________________________________________________________  WHAT IS A BLOOD TRANSFUSION? Blood Transfusion Information  A transfusion is the replacement of blood or some of its parts. Blood is made up of multiple cells which provide different functions.  Red blood cells carry oxygen and are used for blood loss replacement.  White blood cells fight against infection.  Platelets control bleeding.  Plasma helps clot blood.  Other blood products are available for specialized needs, such as hemophilia or other clotting disorders. BEFORE THE TRANSFUSION  Who gives blood for transfusions?   Healthy volunteers who are fully evaluated to make sure their blood is safe. This is blood bank blood. Transfusion therapy is the safest it has ever been in the practice of medicine. Before blood is taken from a donor, a complete history is taken to make sure that person has no history of diseases nor engages in risky social behavior (examples are intravenous drug use or sexual activity with multiple partners). The  donor's travel history is screened to minimize risk of transmitting infections, such as malaria. The donated blood is tested for signs of infectious diseases, such as HIV and hepatitis. The blood is then tested to be sure it is compatible with you in order to minimize the chance of a transfusion reaction. If you or a relative donates blood, this is often done in anticipation of surgery and is not appropriate for emergency situations. It takes many days to process the donated blood. RISKS AND COMPLICATIONS Although transfusion therapy is very safe and saves many lives, the main dangers of transfusion include:   Getting an infectious disease.  Developing a transfusion reaction. This is an allergic reaction to something in the blood you were given. Every precaution is taken to prevent this. The decision to have a blood transfusion has been considered carefully by your caregiver before blood is given. Blood is not given unless the benefits outweigh the risks. AFTER THE TRANSFUSION  Right after receiving a blood transfusion, you will usually feel much better and more energetic. This is especially true if your red blood cells have gotten low (anemic). The transfusion raises the level of the red blood cells which carry oxygen, and this usually causes an energy increase.  The nurse administering the transfusion will monitor you carefully for complications. HOME CARE INSTRUCTIONS  No special instructions are needed after a transfusion. You may find your energy is better. Speak with your caregiver about any limitations on activity for  underlying diseases you may have. SEEK MEDICAL CARE IF:   Your condition is not improving after your transfusion.  You develop redness or irritation at the intravenous (IV) site. SEEK IMMEDIATE MEDICAL CARE IF:  Any of the following symptoms occur over the next 12 hours:  Shaking chills.  You have a temperature by mouth above 102 F (38.9 C), not controlled by  medicine.  Chest, back, or muscle pain.  People around you feel you are not acting correctly or are confused.  Shortness of breath or difficulty breathing.  Dizziness and fainting.  You get a rash or develop hives.  You have a decrease in urine output.  Your urine turns a dark color or changes to pink, red, or brown. Any of the following symptoms occur over the next 10 days:  You have a temperature by mouth above 102 F (38.9 C), not controlled by medicine.  Shortness of breath.  Weakness after normal activity.  The white part of the eye turns yellow (jaundice).  You have a decrease in the amount of urine or are urinating less often.  Your urine turns a dark color or changes to pink, red, or brown. Document Released: 03/13/2000 Document Revised: 06/08/2011 Document Reviewed: 10/31/2007 ExitCare Patient Information 2014 [place].  _______________________________________________________________________  Incentive Spirometer  An incentive spirometer is a tool that can help keep your lungs clear and active. This tool measures how well you are filling your lungs with each breath. Taking long deep breaths may help reverse or decrease the chance of developing breathing (pulmonary) problems (especially infection) following:  A long period of time when you are unable to move or be active. BEFORE THE PROCEDURE   If the spirometer includes an indicator to show your best effort, your nurse or respiratory therapist will set it to a desired goal.  If possible, sit up straight or lean slightly forward. Try not to slouch.  Hold the incentive spirometer in an upright position. INSTRUCTIONS FOR USE  11. Sit on the edge of your bed if possible, or sit up as far as you can in bed or on a chair. 12. Hold the incentive spirometer in an upright position. 13. Breathe out normally. 14. Place the mouthpiece in your mouth and seal your lips tightly around it. 15. Breathe in slowly and  as deeply as possible, raising the piston or the ball toward the top of the column. 16. Hold your breath for 3-5 seconds or for as long as possible. Allow the piston or ball to fall to the bottom of the column. 17. Remove the mouthpiece from your mouth and breathe out normally. 18. Rest for a few seconds and repeat Steps 1 through 7 at least 10 times every 1-2 hours when you are awake. Take your time and take a few normal breaths between deep breaths. 19. The spirometer may include an indicator to show your best effort. Use the indicator as a goal to work toward during each repetition. 20. After each set of 10 deep breaths, practice coughing to be sure your lungs are clear. If you have an incision (the cut made at the time of surgery), support your incision when coughing by placing a pillow or rolled up towels firmly against it. Once you are able to get out of bed, walk around indoors and cough well. You may stop using the incentive spirometer when instructed by your caregiver.  RISKS AND COMPLICATIONS  Take your time so you do not get dizzy or light-headed.  If  you are in pain, you may need to take or ask for pain medication before doing incentive spirometry. It is harder to take a deep breath if you are having pain. AFTER USE  Rest and breathe slowly and easily.  It can be helpful to keep track of a log of your progress. Your caregiver can provide you with a simple table to help with this. If you are using the spirometer at home, follow these instructions: [place] IF:   You are having difficultly using the spirometer.  You have trouble using the spirometer as often as instructed.  Your pain medication is not giving enough relief while using the spirometer.  You develop fever of 100.5 F (38.1 C) or higher. SEEK IMMEDIATE MEDICAL CARE IF:   You cough up bloody sputum that had not been present before.  You develop fever of 102 F (38.9 C) or greater.  You develop  worsening pain at or near the incision site. MAKE SURE YOU:   Understand these instructions.  Will watch your condition.  Will get help right away if you are not doing well or get worse. Document Released: 07/27/2006 Document Revised: 06/08/2011 Document Reviewed: 09/27/2006 [facility] Patient Information 2014 [place], [place].   ________________________________________________________________________

## 2016-04-17 ENCOUNTER — Encounter (HOSPITAL_COMMUNITY)
Admission: RE | Admit: 2016-04-17 | Discharge: 2016-04-17 | Disposition: A | Discharge status: Home / Self Care | Payer: Medicare Other | Source: Ambulatory Visit | Attending: Orthopedic Surgery | Admitting: Orthopedic Surgery

## 2016-04-17 ENCOUNTER — Encounter (HOSPITAL_COMMUNITY): Payer: Self-pay

## 2016-04-17 ENCOUNTER — Ambulatory Visit: Payer: Self-pay | Admitting: Orthopedic Surgery

## 2016-04-17 DIAGNOSIS — Z01812 Encounter for preprocedural laboratory examination: Secondary | ICD-10-CM | POA: Diagnosis not present

## 2016-04-17 DIAGNOSIS — R001 Bradycardia, unspecified: Secondary | ICD-10-CM | POA: Diagnosis not present

## 2016-04-17 DIAGNOSIS — Z01810 Encounter for preprocedural cardiovascular examination: Secondary | ICD-10-CM | POA: Diagnosis not present

## 2016-04-17 DIAGNOSIS — M1611 Unilateral primary osteoarthritis, right hip: Secondary | ICD-10-CM | POA: Insufficient documentation

## 2016-04-17 HISTORY — DX: Gastro-esophageal reflux disease without esophagitis: K21.9

## 2016-04-17 HISTORY — DX: Unspecified osteoarthritis, unspecified site: M19.90

## 2016-04-17 HISTORY — DX: Pneumonia, unspecified organism: J18.9

## 2016-04-17 LAB — PROTIME-INR
INR: 0.95
Prothrombin Time: 12.6 seconds (ref 11.4–15.2)

## 2016-04-17 LAB — URINALYSIS, ROUTINE W REFLEX MICROSCOPIC
Bilirubin Urine: NEGATIVE
GLUCOSE, UA: NEGATIVE mg/dL
HGB URINE DIPSTICK: NEGATIVE
KETONES UR: NEGATIVE mg/dL
Leukocytes, UA: NEGATIVE
Nitrite: NEGATIVE
PROTEIN: NEGATIVE mg/dL
Specific Gravity, Urine: 1.018 (ref 1.005–1.030)
pH: 6 (ref 5.0–8.0)

## 2016-04-17 LAB — APTT: aPTT: 33 seconds (ref 24–36)

## 2016-04-17 LAB — SURGICAL PCR SCREEN
MRSA, PCR: NEGATIVE
Staphylococcus aureus: NEGATIVE

## 2016-04-17 LAB — GLUCOSE, CAPILLARY
Glucose-Capillary: 69 mg/dL (ref 65–99)
Glucose-Capillary: 79 mg/dL (ref 65–99)

## 2016-04-17 LAB — ABO/RH: ABO/RH(D): O POS

## 2016-04-17 NOTE — H&P (Signed)
Laura Neal DOB: October 02, 1950 Widowed / Language: Cleophus Molt / Race: Black or African American Female Date of Admission:  04/22/2016 CC:  Right Hip Pain History of Present Illness The patient is a 66 year old female who comes in for a preoperative History and Physical. The patient is scheduled for a right total hip arthroplasty (anterior) to be performed by Dr. Dione Plover. Aluisio, MD at [facility] on 04-22-2016. The patient is a 66 year old female who presentedfor follow up of their hip. The patient is being followed for their right hip pain and osteoarthritis. They are months out from intra-articular injection. Symptoms reported include: pain, aching and difficulty ambulating. The patient feels that they are doing poorly and report their pain level to be moderate to severe. The following medication has been used for pain control: tramadol. The patient has reported improvement of their symptoms with: Cortisone injections. She states that the hip is getting progressively worse. She has reached a point where she is ready to proceed with surgery at this time. They have been treated conservatively in the past for the above stated problem and despite conservative measures, they continue to have progressive pain and severe functional limitations and dysfunction. They have failed non-operative management including home exercise, medications, and injections. It is felt that they would benefit from undergoing total joint replacement. Risks and benefits of the procedure have been discussed with the patient and they elect to proceed with surgery. There are no active contraindications to surgery such as ongoing infection or rapidly progressive neurological disease.  Problem List/Past Medical Primary osteoarthritis of right hip (M16.11)  Cancer  Renal Chronic Pain  Diabetes Mellitus, Type II  Gout  High blood pressure  Hypercholesterolemia  Urinary Tract Infection   Allergies Codeine Derivatives   she is able to tolerate hydrocodone with no difficulty  Family History  Cancer  Sister. Diabetes Mellitus  Father, Mother. First Degree Relatives  reported Heart Disease  Mother. Heart disease in female family member before age 29  Hypertension  Brother, Mother, Sister. Kidney disease  Mother.  Social History No history of drug/alcohol rehab  Number of flights of stairs before winded  2-3 Tobacco use  Former smoker. 10/25/2013: smoke(d) 1/2 pack(s) per day Not under pain contract  Tobacco / smoke exposure  10/25/2013: no Living situation  live with parents Exercise  Exercises rarely; does other Current work status  retired Marital status  widowed Current drinker  10/25/2013: Currently drinks beer less than 5 times per week Children  0 Advance Directives  Living Will, Healthcare POA  Medication History TraMADol HCl (50MG  Tablet, 1-2 Oral po qhs prn pain, Taken starting 03/11/2016) Active. (called to [facility] ([id number]) Tylenol Extra Strength (500MG  Tablet, Oral) Active. MetFORMIN HCl (1000MG  Tablet, Oral) Active. Invokana (100MG  Tablet, Oral) Active. Byetta 10 MCG Pen (10MCG/0.04ML Soln Pen-inj, Subcutaneous) Active. Lantus SoloStar (100UNIT/ML Soln Pen-inj, Subcutaneous) Active. Pravastatin Sodium (80MG  Tablet, Oral) Active. Losartan Potassium-HCTZ (100-12.5MG  Tablet, Oral) Active. Aspirin EC (81MG  Tablet DR, Oral) Active. Triamcinolone Acetonide (Top) (0.1% Cream, External) Active. Hydrocortisone (Topical) (2.5% Cream, External) Active. Triamcinolone Acetonide (55MCG/ACT Inhaler, Nasal as needed) Active.   Past Surgical History  Spinal Surgery  Date: 1999. Colon Polyp Removal - Colonoscopy  Hysterectomy  Date: 72. partial (non-cancerous) Kidney Removal  Date: 2000. right  Review of Systems General Not Present- Chills, Fatigue, Fever, Memory Loss, Night Sweats, Weight Gain and Weight Loss. Skin Present- Eczema. Not Present- Hives,  Itching, Lesions and Rash. HEENT Not Present- Dentures, Double  Vision, Headache, Hearing Loss, Tinnitus and Visual Loss. Respiratory Not Present- Allergies, Chronic Cough, Coughing up blood, Shortness of breath at rest and Shortness of breath with exertion. Cardiovascular Not Present- Chest Pain, Difficulty Breathing Lying Down, Murmur, Palpitations, Racing/skipping heartbeats and Swelling. Gastrointestinal Present- Diarrhea. Not Present- Abdominal Pain, Bloody Stool, Constipation, Difficulty Swallowing, Heartburn, Jaundice, Loss of appetitie, Nausea and Vomiting. Female Genitourinary Not Present- Blood in Urine, Discharge, Flank Pain, Incontinence, Painful Urination, Urgency, Urinary frequency, Urinary Retention, Urinating at Night and Weak urinary stream. Musculoskeletal Present- Joint Pain. Not Present- Back Pain, Joint Swelling, Morning Stiffness, Muscle Pain, Muscle Weakness and Spasms. Neurological Not Present- Blackout spells, Difficulty with balance, Dizziness, Paralysis, Tremor and Weakness. Psychiatric Not Present- Insomnia.  Vitals Weight: 216 lb Height: 67in Body Surface Area: 2.09 m Body Mass Index: 33.83 kg/m  Pulse: 72 (Regular)  BP: 126/72 (Sitting, Right Arm, Standard)  Physical Exam General Mental Status -Alert, cooperative and good historian. General Appearance-pleasant, Not in acute distress. Orientation-Oriented X3. Build & Nutrition-Well nourished and Well developed.  Head and Neck Head-normocephalic, atraumatic . Neck Global Assessment - supple, no bruit auscultated on the right, no bruit auscultated on the left.  Eye Pupil - Bilateral-Regular and Round. Motion - Bilateral-EOMI.  Chest and Lung Exam Auscultation Breath sounds - clear at anterior chest wall and clear at posterior chest wall. Adventitious sounds - No Adventitious sounds.  Cardiovascular Auscultation Rhythm - Regular rate and rhythm. Heart Sounds - S1 WNL and S2 WNL.  Murmurs & Other Heart Sounds - Auscultation of the heart reveals - No Murmurs.  Abdomen Palpation/Percussion Tenderness - Abdomen is non-tender to palpation. Rigidity (guarding) - Abdomen is soft. Auscultation Auscultation of the abdomen reveals - Bowel sounds normal.  Female Genitourinary Note: Not done, not pertinent to present illness   Musculoskeletal Note: On exam, she is alert and oriented, in no apparent distress. Her right hip can be flexed 100. No internal rotation, about 10 or 20 with external rotation, 20 of abduction.  RADIOGRAPHS Reviewed with her. She has bone on bone arthritis with subchondral cystic formation and osteophyte formation.  Assessment & Plan  Primary osteoarthritis of right hip (M16.11)  Note:Surgical Plans: Right Total Hip Replacement - Anterior Approach  Disposition: Home  PCP: Dr. Coletta Memos Oncology: Dr. Alen Blew  Topical TXA  Anesthesia Issues: NONE  Signed electronically by Ok Edwards, III PA-

## 2016-04-17 NOTE — Progress Notes (Signed)
Clearance Dr. Alen Blew 03/30/16 on chart Clearance Dr. Coletta Memos 04/08/16 on chart w/ lov note 01/28/16 CT chest in epic Clearance 04/06/16 on chart Dr. Annamarie Major. 04/09/16 CMP, CBCw/diff, -EPIC

## 2016-04-17 NOTE — Progress Notes (Signed)
At 1311pm today at preop appointment glucose was 69.  Patient asymptomatic.  Patient given apple juice and graham crackers.  Rechecked glucose at 1330pm and glucose was 79.  Patient continues to voice no complaints.  Patient given another juice along with crackers which was ingested.  At end of preop appointment patient voiced no complaints and left with friend and stated she was going to go eat.

## 2016-04-18 LAB — HEMOGLOBIN A1C
Hgb A1c MFr Bld: 5.1 % (ref 4.8–5.6)
MEAN PLASMA GLUCOSE: 100 mg/dL

## 2016-04-20 NOTE — Progress Notes (Signed)
Final EKG done 02/05/17-epic  

## 2016-04-21 ENCOUNTER — Encounter (HOSPITAL_COMMUNITY): Payer: Self-pay | Admitting: Emergency Medicine

## 2016-04-21 ENCOUNTER — Emergency Department (HOSPITAL_COMMUNITY)
Admission: EM | Admit: 2016-04-21 | Discharge: 2016-04-21 | Disposition: A | Discharge status: Home / Self Care | Payer: Medicare Other | Attending: Emergency Medicine | Admitting: Emergency Medicine

## 2016-04-21 DIAGNOSIS — I10 Essential (primary) hypertension: Secondary | ICD-10-CM | POA: Insufficient documentation

## 2016-04-21 DIAGNOSIS — Z87891 Personal history of nicotine dependence: Secondary | ICD-10-CM | POA: Insufficient documentation

## 2016-04-21 DIAGNOSIS — Z794 Long term (current) use of insulin: Secondary | ICD-10-CM | POA: Insufficient documentation

## 2016-04-21 DIAGNOSIS — K922 Gastrointestinal hemorrhage, unspecified: Secondary | ICD-10-CM | POA: Insufficient documentation

## 2016-04-21 DIAGNOSIS — Z85528 Personal history of other malignant neoplasm of kidney: Secondary | ICD-10-CM | POA: Insufficient documentation

## 2016-04-21 DIAGNOSIS — E119 Type 2 diabetes mellitus without complications: Secondary | ICD-10-CM | POA: Insufficient documentation

## 2016-04-21 DIAGNOSIS — Z7982 Long term (current) use of aspirin: Secondary | ICD-10-CM | POA: Insufficient documentation

## 2016-04-21 LAB — URINALYSIS, ROUTINE W REFLEX MICROSCOPIC
BACTERIA UA: NONE SEEN
BILIRUBIN URINE: NEGATIVE
GLUCOSE, UA: NEGATIVE mg/dL
KETONES UR: NEGATIVE mg/dL
LEUKOCYTES UA: NEGATIVE
NITRITE: NEGATIVE
PH: 7 (ref 5.0–8.0)
Protein, ur: NEGATIVE mg/dL
SPECIFIC GRAVITY, URINE: 1.014 (ref 1.005–1.030)
Squamous Epithelial / LPF: NONE SEEN

## 2016-04-21 LAB — CBC
HEMATOCRIT: 36.8 % (ref 36.0–46.0)
Hemoglobin: 12.2 g/dL (ref 12.0–15.0)
MCH: 33.5 pg (ref 26.0–34.0)
MCHC: 33.2 g/dL (ref 30.0–36.0)
MCV: 101.1 fL — ABNORMAL HIGH (ref 78.0–100.0)
PLATELETS: 289 10*3/uL (ref 150–400)
RBC: 3.64 MIL/uL — ABNORMAL LOW (ref 3.87–5.11)
RDW: 15.5 % (ref 11.5–15.5)
WBC: 7.2 10*3/uL (ref 4.0–10.5)

## 2016-04-21 LAB — COMPREHENSIVE METABOLIC PANEL
ALBUMIN: 3.7 g/dL (ref 3.5–5.0)
ALK PHOS: 63 U/L (ref 38–126)
ALT: 15 U/L (ref 14–54)
AST: 19 U/L (ref 15–41)
Anion gap: 10 (ref 5–15)
BILIRUBIN TOTAL: 0.6 mg/dL (ref 0.3–1.2)
BUN: 17 mg/dL (ref 6–20)
CALCIUM: 9 mg/dL (ref 8.9–10.3)
CO2: 26 mmol/L (ref 22–32)
CREATININE: 1.18 mg/dL — AB (ref 0.44–1.00)
Chloride: 103 mmol/L (ref 101–111)
GFR calc Af Amer: 54 mL/min — ABNORMAL LOW (ref >60)
GFR, EST NON AFRICAN AMERICAN: 47 mL/min — AB (ref >60)
GLUCOSE: 89 mg/dL (ref 65–99)
POTASSIUM: 3.8 mmol/L (ref 3.5–5.1)
Sodium: 139 mmol/L (ref 135–145)
TOTAL PROTEIN: 6.9 g/dL (ref 6.5–8.1)

## 2016-04-21 LAB — LIPASE, BLOOD: Lipase: 15 U/L (ref 11–51)

## 2016-04-21 NOTE — ED Provider Notes (Signed)
  Face-to-face evaluation   History: She developed left lower abdominal discomfort today associated with a single episode of rectal bleeding. She denies fever, chills, nausea, vomiting, weakness or dizziness. She has previously had a colonoscopy, but she states was normal.  Physical exam:, Alert, Cooperative. Abdomen soft, mild left mid abdominal tenderness without rebound tenderness or mass.  MDM- expectant management is indicated, with follow-up with PCP in one week, sooner if needed, for problems.  Medical screening examination/treatment/procedure(s) were conducted as a shared visit with non-physician practitioner(s) and myself.  I personally evaluated the patient during the encounter   Daleen Bo, MD 04/22/16 0127

## 2016-04-21 NOTE — ED Triage Notes (Signed)
Pt reports bloody stools around 430 pm today. abd pain. denies anticoagulants. Denis rectal pain. Alert and oriented x 4.

## 2016-04-21 NOTE — ED Provider Notes (Signed)
[facility] DEPT Provider Note   CSN: [id number] Arrival date & time: 04/21/16  1808   By signing my name below, I, Norris Cross, attest that this documentation has been prepared under the direction and in the presence of Charlann Lange, PA-C. Electronically Signed: Norris Cross , ED Scribe. 04/21/16. 8:52 PM.   History   Chief Complaint Chief Complaint  Patient presents with  . Rectal Bleeding    dark red stool    HPI  Laura Neal is a 66 y.o. female with hx of hysterectomy (1980), kidney cancer (R sided kidney removed in 2000, diagnosed with L sided kidney cancer in 2017) who presents to the Emergency Department complaining of mild rectal bleeding with sudden onset x4 hours. Pt states that she has been experiencing rectal bleeding since 4PM today. She also reports intermittent abdominal pain since having a BM at 2AM this morning. She states that the BM eased the abdominal pain. Pt denies nausea, vomiting, diaphoresis. Pt denies any modifying factors. She denies hx of rectal bleeding episodes, iron supplement use. Of note, pt is scheduled for hip surgery tomorrow. Pt is on chemo for L sided kidney cancer.    The history is provided by the patient. No language interpreter was used.    Past Medical History:  Diagnosis Date  . Allergy   . Arthritis   . Blood transfusion   . Cancer ([place]) 2000   Kidney  . Diabetes mellitus    type II   . GERD (gastroesophageal reflux disease)   . Hyperlipidemia   . Hypertension   . Pneumonia    hx of   . Ulcer [facility])     Patient Active Problem List   Diagnosis Date Noted  . Diabetes mellitus type 2, controlled ([place]) 12/09/2014    Past Surgical History:  Procedure Laterality Date  . ABDOMINAL HYSTERECTOMY  1987  . BACK SURGERY    . ECTOPIC PREGNANCY SURGERY    . NEPHRECTOMY  2000   right-for cancer    OB History    No data available       Home Medications    Prior to Admission medications   Medication Sig Start  Date End Date Taking? Authorizing Provider  aspirin 81 MG tablet Take 81 mg by mouth daily. Reported on 06/14/2015    Historical Provider, MD  BYETTA 10 MCG PEN 10 MCG/0.04ML SOPN injection Inject 10 mg into the skin 2 (two) times daily. 05/13/15   Historical Provider, MD  clobetasol cream (TEMOVATE) [id number] % Apply 1 application topically 2 (two) times daily. Patient not taking: Reported on 04/13/2016 09/24/15   Wyatt Portela, MD  fluticasone [facility]) 50 MCG/ACT nasal spray Place 2 sprays into the nose daily. Patient taking differently: Place 2 sprays into the nose daily as needed for allergies.  11/26/12   Robyn Haber, MD  furosemide (LASIX) 20 MG tablet Take 20 mg by mouth daily as needed for fluid or edema.  10/23/14   Historical Provider, MD  ibuprofen (ADVIL,MOTRIN) 200 MG tablet Take 200 mg by mouth every 6 (six) hours as needed for headache or mild pain.    Historical Provider, MD  insulin glargine (LANTUS) 100 UNIT/ML injection Inject 30 Units into the skin at bedtime.     Historical Provider, MD  losartan-hydrochlorothiazide (HYZAAR) 100-25 MG tablet Take 1 tablet by mouth daily. 05/07/15   Historical Provider, MD  naproxen sodium (ANAPROX) 220 MG tablet Take 220-440 mg by mouth daily as needed.    Historical Provider,  MD  OVER THE COUNTER MEDICATION Immodium prn    Historical Provider, MD  pazopanib (VOTRIENT) 200 MG tablet Take 3 tablets (600 mg total) by mouth daily. Take on an empty stomach. 03/31/16   Wyatt Portela, MD  prochlorperazine (COMPAZINE) 10 MG tablet Take 1 tablet (10 mg total) by mouth every 6 (six) hours as needed for nausea or vomiting. 07/23/15   Wyatt Portela, MD  rosuvastatin (CRESTOR) 10 MG tablet Take 10 mg by mouth at bedtime.  11/18/15   Historical Provider, MD  traMADol (ULTRAM) 50 MG tablet Take 50 mg by mouth every 6 (six) hours as needed for moderate pain or severe pain.  05/22/15   Historical Provider, MD    Family History Family History  Problem Relation Age of  Onset  . Hypertension Mother   . Hypertension Father   . Diabetes Sister   . Cancer Sister   . Diabetes Brother   . Hypertension Brother   . Colon cancer Neg Hx     Social History Social History  Substance Use Topics  . Smoking status: Former Smoker    Quit date: 03/30/2004  . Smokeless tobacco: Former Systems developer  . Alcohol use No     Allergies   Codeine   Review of Systems Review of Systems  Constitutional: Negative for diaphoresis and fever.  Respiratory: Negative.  Negative for shortness of breath.   Cardiovascular: Negative for chest pain.  Gastrointestinal: Positive for abdominal pain and anal bleeding. Negative for constipation, diarrhea, nausea and vomiting.  Genitourinary: Negative.  Negative for dysuria and hematuria.  Musculoskeletal: Negative.  Negative for back pain.  Skin: Negative.  Negative for color change and pallor.  Neurological: Negative for syncope, weakness and light-headedness.     Physical Exam Updated Vital Signs BP 136/67   Pulse 65   Temp 98.1 F (36.7 C)   Resp 18   SpO2 100%   Physical Exam  Constitutional: She appears well-developed and well-nourished. No distress.  HENT:  Head: Normocephalic and atraumatic.  Eyes: Conjunctivae are normal.  No conjunctival pallor.  Neck: Neck supple.  Cardiovascular: Normal rate.   No murmur heard. Pulmonary/Chest: Effort normal. No respiratory distress.  Abdominal: Soft. Bowel sounds are normal. There is tenderness in the periumbilical area.  Soft mildly tenderness to the periumbilical abdomen , bowel sounds are positive  Genitourinary: Rectal exam shows guaiac positive stool.  Genitourinary Comments: Pt has anal tags w/o swelling, discoloration or bleeding. Small amount of stool on digital exam that appears dark. Pt is hemoccult positive.  Musculoskeletal: She exhibits no edema.  Neurological: She is alert.  Skin: Skin is warm and dry.  Psychiatric: She has a normal mood and affect.  Nursing note  and vitals reviewed.    ED Treatments / Results   DIAGNOSTIC STUDIES: Oxygen Saturation is 100% on RA, normal by my interpretation.   COORDINATION OF CARE: 8:51 PM-Discussed next steps with pt. Pt verbalized understanding and is agreeable with the plan.    Labs (all labs ordered are listed, but only abnormal results are displayed) Labs Reviewed  COMPREHENSIVE METABOLIC PANEL - Abnormal; Notable for the following:       Result Value   Creatinine, Ser 1.18 (*)    GFR calc non Af Amer 47 (*)    GFR calc Af Amer 54 (*)    All other components within normal limits  CBC - Abnormal; Notable for the following:    RBC 3.64 (*)    MCV 101.1 (*)  All other components within normal limits  LIPASE, BLOOD  URINALYSIS, ROUTINE W REFLEX MICROSCOPIC  POC OCCULT BLOOD, ED    EKG  EKG Interpretation None       Radiology No results found.  Procedures Procedures (including critical care time)  Medications Ordered in ED Medications - No data to display   Initial Impression / Assessment and Plan / ED Course  I have reviewed the triage vital signs and the nursing notes.  Pertinent labs & imaging results that were available during my care of the patient were reviewed by me and considered in my medical decision making (see chart for details).     Patient presents for concern of blood per rectum x 1 today. She reports mild mid-abdominal pain and is mildly tender on exam. She has normal VS, stable Hgb. Guaiac positive stool.   Patient is seen by Dr. Eulis Foster who feels she is stable for discharge home with GI follow up. She is scheduled for hip surgery tomorrow and is instructed to contact her orthopedist in the morning to discuss proceeding with surgery.  Final Clinical Impressions(s) / ED Diagnoses   Final diagnoses:  None   1. Lower GI bleeding  New Prescriptions New Prescriptions   No medications on file   I personally performed the services described in this  documentation, which was scribed in my presence. The recorded information has been reviewed and is accurate.     Charlann Lange, PA-C 04/21/16 2134    Daleen Bo, MD 04/22/16 [phone]

## 2016-04-22 ENCOUNTER — Inpatient Hospital Stay (HOSPITAL_COMMUNITY): Payer: Medicare Other

## 2016-04-22 ENCOUNTER — Inpatient Hospital Stay (HOSPITAL_COMMUNITY)
Admission: RE | Admit: 2016-04-22 | Discharge: 2016-04-27 | DRG: 470 | Disposition: A | Discharge status: Home Health | Payer: Medicare Other | Source: Ambulatory Visit | Attending: Orthopedic Surgery | Admitting: Orthopedic Surgery

## 2016-04-22 ENCOUNTER — Inpatient Hospital Stay (HOSPITAL_COMMUNITY): Payer: Medicare Other | Admitting: Anesthesiology

## 2016-04-22 ENCOUNTER — Encounter (HOSPITAL_COMMUNITY): Payer: Self-pay | Admitting: *Deleted

## 2016-04-22 ENCOUNTER — Encounter (HOSPITAL_COMMUNITY)
Admission: RE | Disposition: A | Discharge status: Home Health | Payer: Self-pay | Source: Ambulatory Visit | Attending: Orthopedic Surgery

## 2016-04-22 DIAGNOSIS — E119 Type 2 diabetes mellitus without complications: Secondary | ICD-10-CM | POA: Diagnosis present

## 2016-04-22 DIAGNOSIS — Z794 Long term (current) use of insulin: Secondary | ICD-10-CM

## 2016-04-22 DIAGNOSIS — E7800 Pure hypercholesterolemia, unspecified: Secondary | ICD-10-CM | POA: Diagnosis present

## 2016-04-22 DIAGNOSIS — I10 Essential (primary) hypertension: Secondary | ICD-10-CM | POA: Diagnosis present

## 2016-04-22 DIAGNOSIS — M109 Gout, unspecified: Secondary | ICD-10-CM | POA: Diagnosis present

## 2016-04-22 DIAGNOSIS — K219 Gastro-esophageal reflux disease without esophagitis: Secondary | ICD-10-CM | POA: Diagnosis present

## 2016-04-22 DIAGNOSIS — M1611 Unilateral primary osteoarthritis, right hip: Secondary | ICD-10-CM | POA: Diagnosis present

## 2016-04-22 DIAGNOSIS — Z87891 Personal history of nicotine dependence: Secondary | ICD-10-CM

## 2016-04-22 DIAGNOSIS — M25551 Pain in right hip: Secondary | ICD-10-CM | POA: Diagnosis present

## 2016-04-22 DIAGNOSIS — Z85528 Personal history of other malignant neoplasm of kidney: Secondary | ICD-10-CM | POA: Diagnosis not present

## 2016-04-22 DIAGNOSIS — G8929 Other chronic pain: Secondary | ICD-10-CM | POA: Diagnosis present

## 2016-04-22 DIAGNOSIS — Z79899 Other long term (current) drug therapy: Secondary | ICD-10-CM

## 2016-04-22 DIAGNOSIS — D62 Acute posthemorrhagic anemia: Secondary | ICD-10-CM | POA: Diagnosis not present

## 2016-04-22 DIAGNOSIS — Z96649 Presence of unspecified artificial hip joint: Secondary | ICD-10-CM

## 2016-04-22 DIAGNOSIS — Z7982 Long term (current) use of aspirin: Secondary | ICD-10-CM | POA: Diagnosis not present

## 2016-04-22 DIAGNOSIS — M169 Osteoarthritis of hip, unspecified: Secondary | ICD-10-CM | POA: Diagnosis present

## 2016-04-22 HISTORY — PX: TOTAL HIP ARTHROPLASTY: SHX124

## 2016-04-22 LAB — GLUCOSE, CAPILLARY
Glucose-Capillary: 101 mg/dL — ABNORMAL HIGH (ref 65–99)
Glucose-Capillary: 77 mg/dL (ref 65–99)
Glucose-Capillary: 187 mg/dL — ABNORMAL HIGH (ref 65–99)

## 2016-04-22 SURGERY — ARTHROPLASTY, HIP, TOTAL, ANTERIOR APPROACH
Anesthesia: Spinal | Site: Hip | Laterality: Right

## 2016-04-22 MED ORDER — TRANEXAMIC ACID 1000 MG/10ML IV SOLN
2000.0000 mg | Freq: Once | INTRAVENOUS | Status: DC
  Filled 2016-04-22: qty 20

## 2016-04-22 MED ORDER — CEFAZOLIN SODIUM-DEXTROSE 2-4 GM/100ML-% IV SOLN
2.0000 g | INTRAVENOUS | Status: AC
  Administered 2016-04-22: 2 g via INTRAVENOUS

## 2016-04-22 MED ORDER — MIDAZOLAM HCL 2 MG/2ML IJ SOLN
INTRAMUSCULAR | Status: AC
  Filled 2016-04-22: qty 2

## 2016-04-22 MED ORDER — OXYCODONE HCL 5 MG PO TABS
5.0000 mg | ORAL_TABLET | ORAL | Status: DC | PRN
  Administered 2016-04-22: 20:00:00 5 mg via ORAL
  Administered 2016-04-23 – 2016-04-24 (×8): 10 mg via ORAL
  Administered 2016-04-24: 14:00:00 5 mg via ORAL
  Administered 2016-04-24 – 2016-04-27 (×8): 10 mg via ORAL
  Filled 2016-04-22 (×4): qty 2
  Filled 2016-04-22: qty 1
  Filled 2016-04-22 (×14): qty 2

## 2016-04-22 MED ORDER — PAZOPANIB HCL 200 MG PO TABS: 600.0000 mg | ORAL_TABLET | Freq: Every day | ORAL | Status: DC

## 2016-04-22 MED ORDER — ACETAMINOPHEN 325 MG PO TABS
650.0000 mg | ORAL_TABLET | Freq: Four times a day (QID) | ORAL | Status: DC | PRN
  Administered 2016-04-24: 650 mg via ORAL
  Filled 2016-04-22: qty 2

## 2016-04-22 MED ORDER — METHOCARBAMOL 1000 MG/10ML IJ SOLN
500.0000 mg | Freq: Four times a day (QID) | INTRAVENOUS | Status: DC | PRN
  Filled 2016-04-22: qty 5

## 2016-04-22 MED ORDER — DOCUSATE SODIUM 100 MG PO CAPS
100.0000 mg | ORAL_CAPSULE | Freq: Two times a day (BID) | ORAL | Status: DC
  Administered 2016-04-22 – 2016-04-27 (×10): 100 mg via ORAL
  Filled 2016-04-22 (×10): qty 1

## 2016-04-22 MED ORDER — HYDROCHLOROTHIAZIDE 25 MG PO TABS
25.0000 mg | ORAL_TABLET | Freq: Every day | ORAL | Status: DC
  Administered 2016-04-23 – 2016-04-25 (×2): 25 mg via ORAL
  Filled 2016-04-22 (×4): qty 1

## 2016-04-22 MED ORDER — ACETAMINOPHEN 10 MG/ML IV SOLN
INTRAVENOUS | Status: AC
  Filled 2016-04-22: qty 100

## 2016-04-22 MED ORDER — METOCLOPRAMIDE HCL 5 MG PO TABS: 5.0000 mg | ORAL_TABLET | Freq: Three times a day (TID) | ORAL | Status: DC | PRN

## 2016-04-22 MED ORDER — FUROSEMIDE 20 MG PO TABS: 20.0000 mg | ORAL_TABLET | Freq: Every day | ORAL | Status: DC | PRN

## 2016-04-22 MED ORDER — DEXAMETHASONE SODIUM PHOSPHATE 10 MG/ML IJ SOLN
INTRAMUSCULAR | Status: AC
  Filled 2016-04-22: qty 1

## 2016-04-22 MED ORDER — TRANEXAMIC ACID 1000 MG/10ML IV SOLN
INTRAVENOUS | Status: DC | PRN
  Administered 2016-04-22: 2000 mg via TOPICAL

## 2016-04-22 MED ORDER — ONDANSETRON HCL 4 MG/2ML IJ SOLN
INTRAMUSCULAR | Status: AC
  Filled 2016-04-22: qty 2

## 2016-04-22 MED ORDER — PROPOFOL 500 MG/50ML IV EMUL
INTRAVENOUS | Status: DC | PRN
  Administered 2016-04-22: 75 ug/kg/min via INTRAVENOUS

## 2016-04-22 MED ORDER — FENTANYL CITRATE (PF) 100 MCG/2ML IJ SOLN
INTRAMUSCULAR | Status: AC
  Filled 2016-04-22: qty 2

## 2016-04-22 MED ORDER — DEXAMETHASONE SODIUM PHOSPHATE 10 MG/ML IJ SOLN
10.0000 mg | Freq: Once | INTRAMUSCULAR | Status: AC
  Administered 2016-04-22: 10 mg via INTRAVENOUS

## 2016-04-22 MED ORDER — PROPOFOL 10 MG/ML IV BOLUS
INTRAVENOUS | Status: AC
  Filled 2016-04-22: qty 20

## 2016-04-22 MED ORDER — METHOCARBAMOL 500 MG PO TABS
500.0000 mg | ORAL_TABLET | Freq: Four times a day (QID) | ORAL | Status: DC | PRN
  Administered 2016-04-22 – 2016-04-27 (×8): 500 mg via ORAL
  Filled 2016-04-22 (×8): qty 1

## 2016-04-22 MED ORDER — METOCLOPRAMIDE HCL 5 MG/ML IJ SOLN
5.0000 mg | Freq: Three times a day (TID) | INTRAMUSCULAR | Status: DC | PRN
Start: 2016-04-22 — End: 2016-04-27

## 2016-04-22 MED ORDER — MORPHINE SULFATE (PF) 2 MG/ML IV SOLN
1.0000 mg | INTRAVENOUS | Status: DC | PRN
  Administered 2016-04-22 – 2016-04-23 (×2): 1 mg via INTRAVENOUS
  Filled 2016-04-22 (×3): qty 1

## 2016-04-22 MED ORDER — FENTANYL CITRATE (PF) 100 MCG/2ML IJ SOLN: 25.0000 ug | INTRAMUSCULAR | Status: DC | PRN

## 2016-04-22 MED ORDER — ACETAMINOPHEN 10 MG/ML IV SOLN
1000.0000 mg | Freq: Once | INTRAVENOUS | Status: AC
  Administered 2016-04-22: 1000 mg via INTRAVENOUS

## 2016-04-22 MED ORDER — ONDANSETRON HCL 4 MG PO TABS: 4.0000 mg | ORAL_TABLET | Freq: Four times a day (QID) | ORAL | Status: DC | PRN

## 2016-04-22 MED ORDER — DIPHENHYDRAMINE HCL 12.5 MG/5ML PO ELIX: 12.5000 mg | ORAL_SOLUTION | ORAL | Status: DC | PRN

## 2016-04-22 MED ORDER — ONDANSETRON HCL 4 MG/2ML IJ SOLN: 4.0000 mg | Freq: Four times a day (QID) | INTRAMUSCULAR | Status: DC | PRN

## 2016-04-22 MED ORDER — CEFAZOLIN SODIUM-DEXTROSE 2-4 GM/100ML-% IV SOLN
INTRAVENOUS | Status: AC
  Filled 2016-04-22: qty 100

## 2016-04-22 MED ORDER — MIDAZOLAM HCL 5 MG/5ML IJ SOLN
INTRAMUSCULAR | Status: DC | PRN
  Administered 2016-04-22: 2 mg via INTRAVENOUS

## 2016-04-22 MED ORDER — SODIUM CHLORIDE 0.9 % IV SOLN
INTRAVENOUS | Status: DC
  Administered 2016-04-22: 1000 mL via INTRAVENOUS
  Administered 2016-04-23: via INTRAVENOUS

## 2016-04-22 MED ORDER — CHLORHEXIDINE GLUCONATE 4 % EX LIQD: 60.0000 mL | Freq: Once | CUTANEOUS | Status: DC

## 2016-04-22 MED ORDER — INSULIN ASPART 100 UNIT/ML ~~LOC~~ SOLN
0.0000 [IU] | Freq: Three times a day (TID) | SUBCUTANEOUS | Status: DC
  Administered 2016-04-23 (×2): 2 [IU] via SUBCUTANEOUS
  Administered 2016-04-27: 13:00:00 3 [IU] via SUBCUTANEOUS

## 2016-04-22 MED ORDER — ONDANSETRON HCL 4 MG/2ML IJ SOLN: 4.0000 mg | Freq: Once | INTRAMUSCULAR | Status: DC | PRN

## 2016-04-22 MED ORDER — FENTANYL CITRATE (PF) 100 MCG/2ML IJ SOLN
INTRAMUSCULAR | Status: DC | PRN
  Administered 2016-04-22: 50 ug via INTRAVENOUS

## 2016-04-22 MED ORDER — BUPIVACAINE HCL (PF) 0.25 % IJ SOLN
INTRAMUSCULAR | Status: DC | PRN
  Administered 2016-04-22: 30 mL

## 2016-04-22 MED ORDER — BUPIVACAINE HCL (PF) 0.25 % IJ SOLN
INTRAMUSCULAR | Status: AC
  Filled 2016-04-22: qty 30

## 2016-04-22 MED ORDER — ROSUVASTATIN CALCIUM 10 MG PO TABS
10.0000 mg | ORAL_TABLET | Freq: Every day | ORAL | Status: DC
  Administered 2016-04-22 – 2016-04-26 (×5): 10 mg via ORAL
  Filled 2016-04-22 (×5): qty 1

## 2016-04-22 MED ORDER — LOSARTAN POTASSIUM 50 MG PO TABS
100.0000 mg | ORAL_TABLET | Freq: Every day | ORAL | Status: DC
  Administered 2016-04-23 – 2016-04-25 (×2): 100 mg via ORAL
  Filled 2016-04-22 (×4): qty 2

## 2016-04-22 MED ORDER — PROPOFOL 10 MG/ML IV BOLUS
INTRAVENOUS | Status: AC
  Filled 2016-04-22: qty 40

## 2016-04-22 MED ORDER — PAZOPANIB HCL 200 MG PO TABS
600.0000 mg | ORAL_TABLET | Freq: Every day | ORAL | Status: DC
  Administered 2016-04-23 – 2016-04-27 (×5): 600 mg via ORAL

## 2016-04-22 MED ORDER — BUPIVACAINE IN DEXTROSE 0.75-8.25 % IT SOLN
INTRATHECAL | Status: DC | PRN
  Administered 2016-04-22: 1.8 mL via INTRATHECAL

## 2016-04-22 MED ORDER — EXENATIDE 10 MCG/0.04ML ~~LOC~~ SOPN
10.0000 ug | PEN_INJECTOR | Freq: Two times a day (BID) | SUBCUTANEOUS | Status: DC
  Administered 2016-04-23 – 2016-04-24 (×4): 10 ug via SUBCUTANEOUS

## 2016-04-22 MED ORDER — PROPOFOL 10 MG/ML IV BOLUS
INTRAVENOUS | Status: DC | PRN
  Administered 2016-04-22: 30 mg via INTRAVENOUS

## 2016-04-22 MED ORDER — RIVAROXABAN 10 MG PO TABS
10.0000 mg | ORAL_TABLET | Freq: Every day | ORAL | Status: DC
  Administered 2016-04-23 – 2016-04-27 (×5): 10 mg via ORAL
  Filled 2016-04-22 (×5): qty 1

## 2016-04-22 MED ORDER — DEXAMETHASONE SODIUM PHOSPHATE 10 MG/ML IJ SOLN
10.0000 mg | Freq: Once | INTRAMUSCULAR | Status: AC
  Administered 2016-04-23: 10 mg via INTRAVENOUS
  Filled 2016-04-22: qty 1

## 2016-04-22 MED ORDER — TRAMADOL HCL 50 MG PO TABS: 50.0000 mg | ORAL_TABLET | Freq: Four times a day (QID) | ORAL | Status: DC | PRN

## 2016-04-22 MED ORDER — MENTHOL 3 MG MT LOZG: 1.0000 | LOZENGE | OROMUCOSAL | Status: DC | PRN

## 2016-04-22 MED ORDER — CEFAZOLIN SODIUM-DEXTROSE 2-4 GM/100ML-% IV SOLN
2.0000 g | Freq: Four times a day (QID) | INTRAVENOUS | Status: AC
  Administered 2016-04-22 – 2016-04-23 (×2): 2 g via INTRAVENOUS
  Filled 2016-04-22 (×2): qty 100

## 2016-04-22 MED ORDER — PROCHLORPERAZINE MALEATE 10 MG PO TABS
10.0000 mg | ORAL_TABLET | Freq: Four times a day (QID) | ORAL | Status: DC | PRN
  Filled 2016-04-22: qty 1

## 2016-04-22 MED ORDER — PHENOL 1.4 % MT LIQD: 1.0000 | OROMUCOSAL | Status: DC | PRN

## 2016-04-22 MED ORDER — FLEET ENEMA 7-19 GM/118ML RE ENEM: 1.0000 | ENEMA | Freq: Once | RECTAL | Status: DC | PRN

## 2016-04-22 MED ORDER — PHENYLEPHRINE HCL 10 MG/ML IJ SOLN
INTRAVENOUS | Status: DC | PRN
  Administered 2016-04-22: 40 ug/min via INTRAVENOUS

## 2016-04-22 MED ORDER — BISACODYL 10 MG RE SUPP: 10.0000 mg | Freq: Every day | RECTAL | Status: DC | PRN

## 2016-04-22 MED ORDER — FLUTICASONE PROPIONATE 50 MCG/ACT NA SUSP: 2.0000 | Freq: Every day | NASAL | Status: DC | PRN

## 2016-04-22 MED ORDER — PHENYLEPHRINE 40 MCG/ML (10ML) SYRINGE FOR IV PUSH (FOR BLOOD PRESSURE SUPPORT)
PREFILLED_SYRINGE | INTRAVENOUS | Status: DC | PRN
  Administered 2016-04-22: 80 ug via INTRAVENOUS

## 2016-04-22 MED ORDER — DEXTROSE 5 % IV SOLN: 30.0000 ug/min | INTRAVENOUS | Status: DC

## 2016-04-22 MED ORDER — LOSARTAN POTASSIUM-HCTZ 100-25 MG PO TABS: 1.0000 | ORAL_TABLET | Freq: Every day | ORAL | Status: DC

## 2016-04-22 MED ORDER — LACTATED RINGERS IV SOLN
INTRAVENOUS | Status: DC
  Administered 2016-04-22 (×2): via INTRAVENOUS

## 2016-04-22 MED ORDER — ACETAMINOPHEN 650 MG RE SUPP: 650.0000 mg | Freq: Four times a day (QID) | RECTAL | Status: DC | PRN

## 2016-04-22 MED ORDER — MEPERIDINE HCL 50 MG/ML IJ SOLN: 6.2500 mg | INTRAMUSCULAR | Status: DC | PRN

## 2016-04-22 MED ORDER — INSULIN GLARGINE 100 UNIT/ML ~~LOC~~ SOLN
30.0000 [IU] | Freq: Every day | SUBCUTANEOUS | Status: DC
  Administered 2016-04-23 – 2016-04-24 (×2): 30 [IU] via SUBCUTANEOUS
  Filled 2016-04-22 (×5): qty 0.3

## 2016-04-22 MED ORDER — POLYETHYLENE GLYCOL 3350 17 G PO PACK
17.0000 g | PACK | Freq: Every day | ORAL | Status: DC | PRN
  Administered 2016-04-24 – 2016-04-26 (×2): 17 g via ORAL
  Filled 2016-04-22 (×2): qty 1

## 2016-04-22 SURGICAL SUPPLY — 37 items
BAG DECANTER FOR FLEXI CONT (MISCELLANEOUS) ×3 IMPLANT
BAG ZIPLOCK 12X15 (MISCELLANEOUS) IMPLANT
BLADE SAG 18X100X1.27 (BLADE) ×3 IMPLANT
CAPT HIP TOTAL 2 ×3 IMPLANT
CLOSURE WOUND 1/2 X4 (GAUZE/BANDAGES/DRESSINGS) ×1
CLOTH BEACON ORANGE TIMEOUT ST (SAFETY) ×3 IMPLANT
COVER PERINEAL POST (MISCELLANEOUS) ×3 IMPLANT
DECANTER SPIKE VIAL GLASS SM (MISCELLANEOUS) ×3 IMPLANT
DRAPE STERI IOBAN 125X83 (DRAPES) ×3 IMPLANT
DRAPE U-SHAPE 47X51 STRL (DRAPES) ×6 IMPLANT
DRSG ADAPTIC 3X8 NADH LF (GAUZE/BANDAGES/DRESSINGS) ×3 IMPLANT
DRSG MEPILEX BORDER 4X4 (GAUZE/BANDAGES/DRESSINGS) ×3 IMPLANT
DRSG MEPILEX BORDER 4X8 (GAUZE/BANDAGES/DRESSINGS) ×3 IMPLANT
DURAPREP 26ML APPLICATOR (WOUND CARE) ×3 IMPLANT
ELECT REM PT RETURN 9FT ADLT (ELECTROSURGICAL) ×3
ELECTRODE REM PT RTRN 9FT ADLT (ELECTROSURGICAL) ×1 IMPLANT
EVACUATOR 1/8 PVC DRAIN (DRAIN) ×3 IMPLANT
GLOVE BIO SURGEON STRL SZ7.5 (GLOVE) ×3 IMPLANT
GLOVE BIO SURGEON STRL SZ8 (GLOVE) ×6 IMPLANT
GLOVE BIOGEL PI IND STRL 8 (GLOVE) ×2 IMPLANT
GLOVE BIOGEL PI INDICATOR 8 (GLOVE) ×4
GOWN STRL REUS W/TWL LRG LVL3 (GOWN DISPOSABLE) ×3 IMPLANT
GOWN STRL REUS W/TWL XL LVL3 (GOWN DISPOSABLE) ×3 IMPLANT
HEAD FEM STD 32X+13 STRL (Hips) ×3 IMPLANT
NS IRRIG 1000ML POUR BTL (IV SOLUTION) ×3 IMPLANT
PACK ANTERIOR HIP CUSTOM (KITS) ×3 IMPLANT
STRIP CLOSURE SKIN 1/2X4 (GAUZE/BANDAGES/DRESSINGS) ×2 IMPLANT
SUT ETHIBOND NAB CT1 #1 30IN (SUTURE) ×3 IMPLANT
SUT MNCRL AB 4-0 PS2 18 (SUTURE) ×3 IMPLANT
SUT VIC AB 2-0 CT1 27 (SUTURE) ×4
SUT VIC AB 2-0 CT1 TAPERPNT 27 (SUTURE) ×2 IMPLANT
SUT VLOC 180 0 24IN GS25 (SUTURE) ×6 IMPLANT
SYR 50ML LL SCALE MARK (SYRINGE) IMPLANT
TRAY FOLEY W/METER SILVER 14FR (SET/KITS/TRAYS/PACK) ×3 IMPLANT
TRAY FOLEY W/METER SILVER 16FR (SET/KITS/TRAYS/PACK) IMPLANT
WATER STERILE IRR 1000ML POUR (IV SOLUTION) ×6 IMPLANT
YANKAUER SUCT BULB TIP 10FT TU (MISCELLANEOUS) ×3 IMPLANT

## 2016-04-22 NOTE — Anesthesia Postprocedure Evaluation (Signed)
Anesthesia Post Note  Patient: Laura Neal  Procedure(s) Performed: Procedure(s) (LRB): RIGHT TOTAL HIP ARTHROPLASTY ANTERIOR APPROACH (Right)  Patient location during evaluation: PACU Anesthesia Type: Spinal Level of consciousness: oriented and awake and alert Pain management: pain level controlled Vital Signs Assessment: post-procedure vital signs reviewed and stable Respiratory status: spontaneous breathing, respiratory function stable and nonlabored ventilation Cardiovascular status: blood pressure returned to baseline and stable Postop Assessment: no headache, no backache, spinal receding and no signs of nausea or vomiting Anesthetic complications: no       Last Vitals:  Vitals:   04/22/16 1755 04/22/16 1800  BP: 123/69 134/68  Pulse:  (!) 59  Resp:  16  Temp:  36.4 C    Last Pain:  Vitals:   04/22/16 1745  TempSrc:   PainSc: 0-No pain                 Joandry Slagter A.

## 2016-04-22 NOTE — Anesthesia Procedure Notes (Signed)
Spinal  Patient location during procedure: OR Start time: 04/22/2016 2:56 PM End time: 04/22/2016 3:00 PM Reason for block: at surgeon's request Staffing Resident/CRNA: Anne Fu Performed: resident/CRNA  Preanesthetic Checklist Completed: patient identified, site marked, surgical consent, pre-op evaluation, timeout performed, IV checked, risks and benefits discussed and monitors and equipment checked Spinal Block Patient position: sitting Prep: DuraPrep Patient monitoring: heart rate, continuous pulse ox and blood pressure Approach: right paramedian Location: L2-3 Injection technique: single-shot Needle Needle type: Pencan  Needle gauge: 24 G Needle length: 9 cm Assessment Sensory level: T6 Additional Notes Expiration date of kit checked and confirmed. Patient tolerated procedure well, without complications. X 1 attempt with noted clear CSF return. Loss of motor and sensory on exam post injection.

## 2016-04-22 NOTE — Op Note (Signed)
OPERATIVE REPORT- TOTAL HIP ARTHROPLASTY   PREOPERATIVE DIAGNOSIS: Osteoarthritis of the Right hip.   POSTOPERATIVE DIAGNOSIS: Osteoarthritis of the Right  hip.   PROCEDURE: Right total hip arthroplasty, anterior approach.   SURGEON: Gaynelle Arabian, MD   ASSISTANT: Arlee Muslim, Pa-C  ANESTHESIA:  Spinal  ESTIMATED BLOOD LOSS:-300 ml    DRAINS: Hemovac x1.   COMPLICATIONS: None   CONDITION: PACU - hemodynamically stable.   BRIEF CLINICAL NOTE: Laura Neal is a 66 y.o. female who has advanced end-  stage arthritis of their Right  hip with progressively worsening pain and  dysfunction.The patient has failed nonoperative management and presents for  total hip arthroplasty.   PROCEDURE IN DETAIL: After successful administration of spinal  anesthetic, the traction boots for the [facility] bed were placed on both  feet and the patient was placed onto the [facility] bed, boots placed into the leg  holders. The Right hip was then isolated from the perineum with plastic  drapes and prepped and draped in the usual sterile fashion. ASIS and  greater trochanter were marked and a oblique incision was made, starting  at about 1 cm lateral and 2 cm distal to the ASIS and coursing towards  the anterior cortex of the femur. The skin was cut with a 10 blade  through subcutaneous tissue to the level of the fascia overlying the  tensor fascia lata muscle. The fascia was then incised in line with the  incision at the junction of the anterior third and posterior 2/3rd. The  muscle was teased off the fascia and then the interval between the TFL  and the rectus was developed. The Hohmann retractor was then placed at  the top of the femoral neck over the capsule. The vessels overlying the  capsule were cauterized and the fat on top of the capsule was removed.  A Hohmann retractor was then placed anterior underneath the rectus  femoris to give exposure to the entire anterior capsule. A T-shaped   capsulotomy was performed. The edges were tagged and the femoral head  was identified.       Osteophytes are removed off the superior acetabulum.  The femoral neck was then cut in situ with an oscillating saw. Traction  was then applied to the left lower extremity utilizing the [facility]  traction. The femoral head was then removed. Retractors were placed  around the acetabulum and then circumferential removal of the labrum was  performed. Osteophytes were also removed. Reaming starts at 45 mm to  medialize and  Increased in 2 mm increments to 51 mm. We reamed in  approximately 40 degrees of abduction, 20 degrees anteversion. A 52 mm  pinnacle acetabular shell was then impacted in anatomic position under  fluoroscopic guidance with excellent purchase. We did not need to place  any additional dome screws. A 32 mm neutral + 4 marathon liner was then  placed into the acetabular shell.       The femoral lift was then placed along the lateral aspect of the femur  just distal to the vastus ridge. The leg was  externally rotated and capsule  was stripped off the inferior aspect of the femoral neck down to the  level of the lesser trochanter, this was done with electrocautery. The femur was lifted after this was performed. The  leg was then placed in  an extended and adducted position essentially delivering the femur. We also removed the capsule superiorly and the piriformis  from  the piriformis fossa to gain excellent exposure of the  proximal femur. Rongeur was used to remove some cancellous bone to get  into the lateral portion of the proximal femur for placement of the  initial starter reamer. The starter broaches was placed  the starter broach  and was shown to go down the center of the canal. Broaching  with the  Corail system was then performed starting at size 8, coursing  Up to size 11. A size 11 had excellent torsional and rotational  and axial stability. The trial high offset neck was then  placed  with a 32 + 9 trial head. The hip was then reduced. We confirmed that  the stem was in the canal both on AP and lateral x-rays. It also has excellent sizing. The hip was reduced with outstanding stability  through full extension and full external rotation.. AP pelvis was taken and the leg lengths were measured and found to be equal.  Hip was then dislocated again and the femoral head and neck removed. The  femoral broach was removed. Size 11 Corail stem with a collarless high offset  neck was then impacted into the femur following native anteversion. Has  excellent purchase in the canal. Excellent torsional and rotational and  axial stability. It is confirmed to be in the canal on AP and lateral  fluoroscopic views. The 32 + 9 ceramic head was placed and the hip  reduced with outstanding stability. Again AP pelvis was taken and it  confirmed that the leg lengths were equal. There is a tiny avulsed fragment off the tip of the greater trochanter and this area is inspected and it is in a  stable position with intact tendon onto the femur thus it is left in place.The wound was then copiously  irrigated with saline solution and the capsule reattached and repaired  with Ethibond suture. 30 ml of .25% Bupivicaine was  injected into the capsule and into the edge of the tensor fascia lata as well as subcutaneous tissue. The fascia overlying the tensor fascia lata was then closed with a running #1 V-Loc.  Subcu was closed with interrupted 2-0 Vicryl and subcuticular running 4-0 Monocryl. Incision was cleaned and dried. Steri-Strips  and a bulky sterile dressing applied. Hemovac drain was hooked to suction and then the patient was awakened and transported to  recovery in stable condition.        Please note that a surgical assistant was a medical necessity for this procedure to perform it in a safe and expeditious manner.  Assistant was necessary to provide appropriate retraction of vital  neurovascular structures and to prevent femoral fracture  and allow for anatomic placement of the prosthesis.  Gaynelle Arabian, M.D.

## 2016-04-22 NOTE — Anesthesia Preprocedure Evaluation (Signed)
Anesthesia Evaluation  Patient identified by MRN, date of birth, ID band Patient awake    Reviewed: Allergy & Precautions, NPO status , Patient's Chart, lab work & pertinent test results  Airway Mallampati: II  TM Distance: >3 FB Neck ROM: Full    Dental no notable dental hx. (+) Teeth Intact, Missing   Pulmonary pneumonia, resolved, former smoker,    Pulmonary exam normal breath sounds clear to auscultation       Cardiovascular hypertension, Pt. on medications Normal cardiovascular exam Rhythm:Regular Rate:Normal     Neuro/Psych negative neurological ROS  negative psych ROS   GI/Hepatic Neg liver ROS, GERD  ,  Endo/Other  diabetes, Well Controlled, Type 2, Oral Hypoglycemic Agents, Insulin Dependent  Renal/GU negative Renal ROS  negative genitourinary   Musculoskeletal  (+) Arthritis , Osteoarthritis,  Right Hip OA   Abdominal (+) + obese,   Peds  (+) Congenital Heart Disease Hematology negative hematology ROS (+)   Anesthesia Other Findings   Reproductive/Obstetrics                               Chemistry      Component Value Date/Time   NA 139 04/21/2016 1917   NA 143 04/09/2016 0918   K 3.8 04/21/2016 1917   K 3.5 04/09/2016 0918   CL 103 04/21/2016 1917   CO2 26 04/21/2016 1917   CO2 27 04/09/2016 0918   BUN 17 04/21/2016 1917   BUN 17.0 04/09/2016 0918   CREATININE 1.18 (H) 04/21/2016 1917   CREATININE 1.4 (H) 04/09/2016 0918      Component Value Date/Time   CALCIUM 9.0 04/21/2016 1917   CALCIUM 9.3 04/09/2016 0918   ALKPHOS 63 04/21/2016 1917   ALKPHOS 86 04/09/2016 0918   AST 19 04/21/2016 1917   AST 18 04/09/2016 0918   ALT 15 04/21/2016 1917   ALT 10 04/09/2016 0918   BILITOT 0.6 04/21/2016 1917   BILITOT 0.63 04/09/2016 0918     Lab Results  Component Value Date   WBC 7.2 04/21/2016   HGB 12.2 04/21/2016   HCT 36.8 04/21/2016   MCV 101.1 (H) 04/21/2016   PLT 289 04/21/2016   EKG: sinus bradycardia, low voltage.  Anesthesia Physical Anesthesia Plan  ASA: II  Anesthesia Plan: Spinal   Post-op Pain Management:    Induction:   Airway Management Planned: Natural Airway, Simple Face Mask and Nasal Cannula  Additional Equipment:   Intra-op Plan:   Post-operative Plan:   Informed Consent: I have reviewed the patients History and Physical, chart, labs and discussed the procedure including the risks, benefits and alternatives for the proposed anesthesia with the patient or authorized representative who has indicated his/her understanding and acceptance.   Dental advisory given  Plan Discussed with: CRNA, Anesthesiologist and Surgeon  Anesthesia Plan Comments:         Anesthesia Quick Evaluation

## 2016-04-22 NOTE — H&P (View-Only) (Signed)
Laura Neal DOB: 1951-02-02 Widowed / Language: Cleophus Molt / Race: Black or African American Female Date of Admission:  04/22/2016 CC:  Right Hip Pain History of Present Illness The patient is a 66 year old female who comes in for a preoperative History and Physical. The patient is scheduled for a right total hip arthroplasty (anterior) to be performed by Dr. Dione Plover. Aluisio, MD at [facility] on 04-22-2016. The patient is a 66 year old female who presentedfor follow up of their hip. The patient is being followed for their right hip pain and osteoarthritis. They are months out from intra-articular injection. Symptoms reported include: pain, aching and difficulty ambulating. The patient feels that they are doing poorly and report their pain level to be moderate to severe. The following medication has been used for pain control: tramadol. The patient has reported improvement of their symptoms with: Cortisone injections. She states that the hip is getting progressively worse. She has reached a point where she is ready to proceed with surgery at this time. They have been treated conservatively in the past for the above stated problem and despite conservative measures, they continue to have progressive pain and severe functional limitations and dysfunction. They have failed non-operative management including home exercise, medications, and injections. It is felt that they would benefit from undergoing total joint replacement. Risks and benefits of the procedure have been discussed with the patient and they elect to proceed with surgery. There are no active contraindications to surgery such as ongoing infection or rapidly progressive neurological disease.  Problem List/Past Medical Primary osteoarthritis of right hip (M16.11)  Cancer  Renal Chronic Pain  Diabetes Mellitus, Type II  Gout  High blood pressure  Hypercholesterolemia  Urinary Tract Infection   Allergies Codeine Derivatives   she is able to tolerate hydrocodone with no difficulty  Family History  Cancer  Sister. Diabetes Mellitus  Father, Mother. First Degree Relatives  reported Heart Disease  Mother. Heart disease in female family member before age 33  Hypertension  Brother, Mother, Sister. Kidney disease  Mother.  Social History No history of drug/alcohol rehab  Number of flights of stairs before winded  2-3 Tobacco use  Former smoker. 10/25/2013: smoke(d) 1/2 pack(s) per day Not under pain contract  Tobacco / smoke exposure  10/25/2013: no Living situation  live with parents Exercise  Exercises rarely; does other Current work status  retired Marital status  widowed Current drinker  10/25/2013: Currently drinks beer less than 5 times per week Children  0 Advance Directives  Living Will, Healthcare POA  Medication History TraMADol HCl (50MG  Tablet, 1-2 Oral po qhs prn pain, Taken starting 03/11/2016) Active. (called to [facility] ([id number]) Tylenol Extra Strength (500MG  Tablet, Oral) Active. MetFORMIN HCl (1000MG  Tablet, Oral) Active. Invokana (100MG  Tablet, Oral) Active. Byetta 10 MCG Pen (10MCG/0.04ML Soln Pen-inj, Subcutaneous) Active. Lantus SoloStar (100UNIT/ML Soln Pen-inj, Subcutaneous) Active. Pravastatin Sodium (80MG  Tablet, Oral) Active. Losartan Potassium-HCTZ (100-12.5MG  Tablet, Oral) Active. Aspirin EC (81MG  Tablet DR, Oral) Active. Triamcinolone Acetonide (Top) (0.1% Cream, External) Active. Hydrocortisone (Topical) (2.5% Cream, External) Active. Triamcinolone Acetonide (55MCG/ACT Inhaler, Nasal as needed) Active.   Past Surgical History  Spinal Surgery  Date: 1999. Colon Polyp Removal - Colonoscopy  Hysterectomy  Date: 40. partial (non-cancerous) Kidney Removal  Date: 2000. right  Review of Systems General Not Present- Chills, Fatigue, Fever, Memory Loss, Night Sweats, Weight Gain and Weight Loss. Skin Present- Eczema. Not Present- Hives,  Itching, Lesions and Rash. HEENT Not Present- Dentures, Double  Vision, Headache, Hearing Loss, Tinnitus and Visual Loss. Respiratory Not Present- Allergies, Chronic Cough, Coughing up blood, Shortness of breath at rest and Shortness of breath with exertion. Cardiovascular Not Present- Chest Pain, Difficulty Breathing Lying Down, Murmur, Palpitations, Racing/skipping heartbeats and Swelling. Gastrointestinal Present- Diarrhea. Not Present- Abdominal Pain, Bloody Stool, Constipation, Difficulty Swallowing, Heartburn, Jaundice, Loss of appetitie, Nausea and Vomiting. Female Genitourinary Not Present- Blood in Urine, Discharge, Flank Pain, Incontinence, Painful Urination, Urgency, Urinary frequency, Urinary Retention, Urinating at Night and Weak urinary stream. Musculoskeletal Present- Joint Pain. Not Present- Back Pain, Joint Swelling, Morning Stiffness, Muscle Pain, Muscle Weakness and Spasms. Neurological Not Present- Blackout spells, Difficulty with balance, Dizziness, Paralysis, Tremor and Weakness. Psychiatric Not Present- Insomnia.  Vitals Weight: 216 lb Height: 67in Body Surface Area: 2.09 m Body Mass Index: 33.83 kg/m  Pulse: 72 (Regular)  BP: 126/72 (Sitting, Right Arm, Standard)  Physical Exam General Mental Status -Alert, cooperative and good historian. General Appearance-pleasant, Not in acute distress. Orientation-Oriented X3. Build & Nutrition-Well nourished and Well developed.  Head and Neck Head-normocephalic, atraumatic . Neck Global Assessment - supple, no bruit auscultated on the right, no bruit auscultated on the left.  Eye Pupil - Bilateral-Regular and Round. Motion - Bilateral-EOMI.  Chest and Lung Exam Auscultation Breath sounds - clear at anterior chest wall and clear at posterior chest wall. Adventitious sounds - No Adventitious sounds.  Cardiovascular Auscultation Rhythm - Regular rate and rhythm. Heart Sounds - S1 WNL and S2 WNL.  Murmurs & Other Heart Sounds - Auscultation of the heart reveals - No Murmurs.  Abdomen Palpation/Percussion Tenderness - Abdomen is non-tender to palpation. Rigidity (guarding) - Abdomen is soft. Auscultation Auscultation of the abdomen reveals - Bowel sounds normal.  Female Genitourinary Note: Not done, not pertinent to present illness   Musculoskeletal Note: On exam, she is alert and oriented, in no apparent distress. Her right hip can be flexed 100. No internal rotation, about 10 or 20 with external rotation, 20 of abduction.  RADIOGRAPHS Reviewed with her. She has bone on bone arthritis with subchondral cystic formation and osteophyte formation.  Assessment & Plan  Primary osteoarthritis of right hip (M16.11)  Note:Surgical Plans: Right Total Hip Replacement - Anterior Approach  Disposition: Home  PCP: Dr. Coletta Memos Oncology: Dr. Alen Blew  Topical TXA  Anesthesia Issues: NONE  Signed electronically by Ok Edwards, III PA-

## 2016-04-22 NOTE — Interval H&P Note (Signed)
History and Physical Interval Note:  04/22/2016 2:50 PM  Laura Neal  has presented today for surgery, with the diagnosis of RIGHT HIP OA  The various methods of treatment have been discussed with the patient and family. After consideration of risks, benefits and other options for treatment, the patient has consented to  Procedure(s): RIGHT TOTAL HIP ARTHROPLASTY ANTERIOR APPROACH (Right) as a surgical intervention .  The patient's history has been reviewed, patient examined, no change in status, stable for surgery.  I have reviewed the patient's chart and labs.  Questions were answered to the patient's satisfaction.     Gearlean Alf

## 2016-04-22 NOTE — Transfer of Care (Signed)
Immediate Anesthesia Transfer of Care Note  Patient: Laura Neal  Procedure(s) Performed: Procedure(s): RIGHT TOTAL HIP ARTHROPLASTY ANTERIOR APPROACH (Right)  Patient Location: PACU  Anesthesia Type:Spinal  Level of Consciousness:  sedated, patient cooperative and responds to stimulation  Airway & Oxygen Therapy:Patient Spontanous Breathing and Patient connected to face mask oxgen  Post-op Assessment:  Report given to PACU RN and Post -op Vital signs reviewed and stable  Post vital signs:  Reviewed and stable  Last Vitals:  Vitals:   04/22/16 1306  BP: 136/72  Pulse: 86  Resp: 18  Temp: 123XX123 C    Complications: No apparent anesthesia complications

## 2016-04-23 LAB — GLUCOSE, CAPILLARY
GLUCOSE-CAPILLARY: 138 mg/dL — AB (ref 65–99)
Glucose-Capillary: 126 mg/dL — ABNORMAL HIGH (ref 65–99)
Glucose-Capillary: 139 mg/dL — ABNORMAL HIGH (ref 65–99)
Glucose-Capillary: 147 mg/dL — ABNORMAL HIGH (ref 65–99)

## 2016-04-23 LAB — CBC
HEMATOCRIT: 32.6 % — AB (ref 36.0–46.0)
Hemoglobin: 10.7 g/dL — ABNORMAL LOW (ref 12.0–15.0)
MCH: 34 pg (ref 26.0–34.0)
MCHC: 32.8 g/dL (ref 30.0–36.0)
MCV: 103.5 fL — AB (ref 78.0–100.0)
PLATELETS: 264 10*3/uL (ref 150–400)
RBC: 3.15 MIL/uL — ABNORMAL LOW (ref 3.87–5.11)
RDW: 16 % — AB (ref 11.5–15.5)
WBC: 9.3 10*3/uL (ref 4.0–10.5)

## 2016-04-23 LAB — BASIC METABOLIC PANEL
ANION GAP: 7 (ref 5–15)
BUN: 13 mg/dL (ref 6–20)
CALCIUM: 8.5 mg/dL — AB (ref 8.9–10.3)
CO2: 27 mmol/L (ref 22–32)
Chloride: 105 mmol/L (ref 101–111)
Creatinine, Ser: 1.16 mg/dL — ABNORMAL HIGH (ref 0.44–1.00)
GFR calc Af Amer: 55 mL/min — ABNORMAL LOW (ref >60)
GFR calc non Af Amer: 48 mL/min — ABNORMAL LOW (ref >60)
GLUCOSE: 136 mg/dL — AB (ref 65–99)
POTASSIUM: 3.8 mmol/L (ref 3.5–5.1)
Sodium: 139 mmol/L (ref 135–145)

## 2016-04-23 NOTE — Evaluation (Signed)
Occupational Therapy Evaluation Patient Details Name: Laura Neal MRN: [id number] DOB: May 13, 1950 Today's Date: 04/23/2016    History of Present Illness s/p R DA THA   Clinical Impression   This 66 year old female was admitted for the above sx.  She will benefit from continued OT in acute setting with supervision to min guard level goals for toileting and grooming.  She will have family assistance for adls at home and plans to sponge bathe initially    Follow Up Recommendations  No OT follow up    Equipment Recommendations  3 in 1 bedside commode    Recommendations for Other Services       Precautions / Restrictions Precautions Precautions: Fall Restrictions Weight Bearing Restrictions: No      Mobility Bed Mobility               General bed mobility comments: oob  Transfers Overall transfer level: Needs assistance Equipment used: Rolling walker (2 wheeled) Transfers: Sit to/from Stand Sit to Stand: Min assist         General transfer comment: cues for UE/LE placement    Balance                                            ADL Overall ADL's : Needs assistance/impaired     Grooming: Set up;Sitting;Wash/dry hands;Wash/dry face   Upper Body Bathing: Set up;Sitting   Lower Body Bathing: Moderate assistance;Sit to/from stand   Upper Body Dressing : Set up;Sitting   Lower Body Dressing: Maximal assistance;Sit to/from stand                 General ADL Comments: performed adl from recliner, sit to stand. Pt has catheter at this time.  Educated on AE. Pt will have sister assist as needed.  She feels that she will sponge bathe initially; educated on tub bench if desires     Vision     Perception     Praxis      Pertinent Vitals/Pain Pain Assessment: Faces Faces Pain Scale: Hurts even more Pain Location: R hip and L knee with weight bearing Pain Descriptors / Indicators: Aching Pain Intervention(s): Limited activity  within patient's tolerance;Monitored during session;Premedicated before session;Repositioned;Ice applied     Hand Dominance     Extremity/Trunk Assessment             Communication Communication Communication: No difficulties   Cognition Arousal/Alertness: Awake/alert Behavior During Therapy: WFL for tasks assessed/performed Overall Cognitive Status: Within Functional Limits for tasks assessed                     General Comments       Exercises       Shoulder Instructions      Home Living Family/patient expects to be discharged to:: Private residence Living Arrangements: Parent;Other relatives(brother and sister) Available Help at Discharge: Family               Bathroom Shower/Tub: Tub/shower unit Shower/tub characteristics: Architectural technologist: Standard                Prior Functioning/Environment Level of Independence: Independent                 OT Problem List: Decreased strength;Decreased activity tolerance;Decreased knowledge of use of DME or AE;Pain   OT Treatment/Interventions: Self-care/ADL training;DME and/or AE  instruction;Patient/family education;Therapeutic activities    OT Goals(Current goals can be found in the care plan section) Acute Rehab OT Goals Patient Stated Goal: decreased pain OT Goal Formulation: With patient Time For Goal Achievement: 04/27/16 Potential to Achieve Goals: Good ADL Goals Pt Will Perform Grooming: with supervision;standing Pt Will Transfer to Toilet: with min guard assist;ambulating;bedside commode Pt Will Perform Toileting - Clothing Manipulation and hygiene: with min guard assist;sit to/from stand  OT Frequency: Min 2X/week   Barriers to D/C:            Co-evaluation              End of Session    Activity Tolerance: Patient tolerated treatment well Patient left: in chair;with call bell/phone within reach   Time: 1043-1104 OT Time Calculation (min): 21 min Charges:  OT  General Charges $OT Visit: 1 Procedure OT Evaluation $OT Eval Low Complexity: 1 Procedure G-Codes:    Bradley Bostelman 2016-04-27, 11:55 AM Lesle Chris, OTR/L [phone] 2016-04-27

## 2016-04-23 NOTE — Evaluation (Signed)
Physical Therapy Evaluation Patient Details Name: Laura Neal MRN: [id number] DOB: [birth year] Today's Date: 04/23/2016   History of Present Illness  s/p R DA THA  Clinical Impression  Pt s/p R THR and presents with decreased R LE strength/ROM and post op pain limiting functional mobility.  Pt should progress to dc home with family assist and HHPT follow up.    Follow Up Recommendations Home health PT    Equipment Recommendations  Rolling walker with 5" wheels    Recommendations for Other Services OT consult     Precautions / Restrictions Precautions Precautions: Fall Restrictions Weight Bearing Restrictions: No Other Position/Activity Restrictions: WBAT      Mobility  Bed Mobility Overal bed mobility: Needs Assistance Bed Mobility: Supine to Sit     Supine to sit: Min assist     General bed mobility comments: cues for sequence and use of L LE to self assist  Transfers Overall transfer level: Needs assistance Equipment used: Rolling walker (2 wheeled) Transfers: Sit to/from Stand Sit to Stand: Min assist         General transfer comment: cues for UE/LE placement  Ambulation/Gait Ambulation/Gait assistance: Min assist;+2 safety/equipment (chair follow) Ambulation Distance (Feet): 28 Feet Assistive device: Rolling walker (2 wheeled) Gait Pattern/deviations: Step-to pattern;Decreased step length - right;Decreased step length - left;Shuffle;Trunk flexed Gait velocity: decr Gait velocity interpretation: Below normal speed for age/gender General Gait Details: cues for posture, position from RW and sequence  Stairs            Wheelchair Mobility    Modified Rankin (Stroke Patients Only)       Balance Overall balance assessment: No apparent balance deficits (not formally assessed)                                           Pertinent Vitals/Pain Pain Assessment: 0-10 Pain Score: 5  Faces Pain Scale: Hurts even more Pain  Location: R hip and L knee with weight bearing Pain Descriptors / Indicators: Aching Pain Intervention(s): Limited activity within patient's tolerance;Monitored during session;Premedicated before session;Ice applied    Home Living Family/patient expects to be discharged to:: Private residence Living Arrangements: Parent;Other relatives Available Help at Discharge: Family Type of Home: House Home Access: Stairs to enter Entrance Stairs-Rails: Right Entrance Stairs-Number of Steps: 3 Home Layout: Two level;Able to live on main level with bedroom/bathroom Home Equipment: Kasandra Knudsen - single point      Prior Function Level of Independence: Independent               Hand Dominance        Extremity/Trunk Assessment   Upper Extremity Assessment Upper Extremity Assessment: Overall WFL for tasks assessed    Lower Extremity Assessment Lower Extremity Assessment: RLE deficits/detail RLE Deficits / Details: strength at hip 2+/5 with AAROM at hip to 80 flex and 15 abd    Cervical / Trunk Assessment Cervical / Trunk Assessment: Normal  Communication   Communication: No difficulties  Cognition Arousal/Alertness: Awake/alert Behavior During Therapy: WFL for tasks assessed/performed Overall Cognitive Status: Within Functional Limits for tasks assessed                      General Comments      Exercises Total Joint Exercises Ankle Circles/Pumps: AROM;Both;15 reps;Supine Quad Sets: AROM;Both;10 reps;Supine Heel Slides: AAROM;Right;20 reps;Supine Hip ABduction/ADduction: AAROM;Right;15 reps;Supine   Assessment/Plan  PT Assessment Patient needs continued PT services  PT Problem List Decreased strength;Decreased range of motion;Decreased activity tolerance;Decreased mobility;Pain;Decreased knowledge of use of DME          PT Treatment Interventions DME instruction;Gait training;Stair training;Functional mobility training;Therapeutic activities;Therapeutic  exercise;Patient/family education    PT Goals (Current goals can be found in the Care Plan section)  Acute Rehab PT Goals Patient Stated Goal: decreased pain PT Goal Formulation: With patient Time For Goal Achievement: 04/25/16 Potential to Achieve Goals: Good    Frequency 7X/week   Barriers to discharge        Co-evaluation               End of Session Equipment Utilized During Treatment: Gait belt Activity Tolerance: Patient limited by fatigue;Other (comment) (I feel so hot) Patient left: in chair;with call bell/phone within reach Nurse Communication: Mobility status         Time: [id number] PT Time Calculation (min) (ACUTE ONLY): 31 min   Charges:   PT Evaluation $PT Eval Low Complexity: 1 Procedure PT Treatments $Therapeutic Exercise: 8-22 mins   PT G Codes:        Sherrine Salberg 2016/05/18, 12:10 PM

## 2016-04-23 NOTE — Care Management Note (Signed)
Case Management Note  Patient Details  Name: Laura Neal MRN: [id number] Date of Birth: 11-Jul-1950  Subjective/Objective:                  RIGHT TOTAL HIP ARTHROPLASTY ANTERIOR APPROACH (Right) Action/Plan: Discharge planning Expected Discharge Date:                  Expected Discharge Plan:  [place]  In-House Referral:     Discharge planning Services  CM Consult  Post Acute Care Choice:  Home Health Choice offered to:  Patient  DME Arranged:  3-N-1, Walker rolling DME Agency:  [place]:  PT [place]:  [facility] (formerly [facility])  Status of Service:  Completed, signed off  If discussed at [employer], dates discussed:    Additional Comments: Cm met with pt in room to offer choice of home health agency. Pt chooses [facility] to render HHPT. Cm has requested face to face and HHPT orders.  Referral given to [facility] rep, tim who is waiting for orders and face to face. CM notified [place] DME rep, Brad to please delver the 3n1 and rolling walker to room prior to discharge. Dellie Catholic, RN 04/23/2016, 12:12 PM

## 2016-04-23 NOTE — Progress Notes (Signed)
   Subjective: 1 Day Post-Op Procedure(s) (LRB): RIGHT TOTAL HIP ARTHROPLASTY ANTERIOR APPROACH (Right) Patient reports pain as mild.   Patient seen in rounds by Dr. Wynelle Link. Patient is well, but has had some minor complaints of pain in the hip, requiring pain medications We will start therapy today.  Plan is to go Home after hospital stay.  Objective: Vital signs in last 24 hours: Temp:  [97.4 F (36.3 C)-99.3 F (37.4 C)] 98.8 F (37.1 C) (01/25 0629) Pulse Rate:  [56-86] 60 (01/25 0629) Resp:  [13-18] 16 (01/25 0629) BP: (94-152)/(55-72) 105/55 (01/25 0629) SpO2:  [98 %-100 %] 98 % (01/25 0629) Weight:  [98 kg (216 lb)] 98 kg (216 lb) (01/24 1320)  Intake/Output from previous day:  Intake/Output Summary (Last 24 hours) at 04/23/16 0859 Last data filed at 04/23/16 0630  Gross per 24 hour  Intake             4050 ml  Output             2240 ml  Net             1810 ml    Intake/Output this shift: No intake/output data recorded.  Labs:  Recent Labs  04/21/16 1917 04/23/16 0442  HGB 12.2 10.7*    Recent Labs  04/21/16 1917 04/23/16 0442  WBC 7.2 9.3  RBC 3.64* 3.15*  HCT 36.8 32.6*  PLT 289 264    Recent Labs  04/21/16 1917 04/23/16 0442  NA 139 139  K 3.8 3.8  CL 103 105  CO2 26 27  BUN 17 13  CREATININE 1.18* 1.16*  GLUCOSE 89 136*  CALCIUM 9.0 8.5*   No results for input(s): LABPT, INR in the last 72 hours.  EXAM General - Patient is Alert, Appropriate and Oriented Extremity - Neurovascular intact Sensation intact distally Dorsiflexion/Plantar flexion intact Dressing - dressing C/D/I Motor Function - intact, moving foot and toes well on exam.  Hemovac pulled without difficulty.  Past Medical History:  Diagnosis Date  . Allergy   . Arthritis   . Blood transfusion   . Cancer ([place]) 2000   Kidney  . Diabetes mellitus    type II   . GERD (gastroesophageal reflux disease)   . Hyperlipidemia   . Hypertension   . Pneumonia    hx of     . Ulcer ([place])     Assessment/Plan: 1 Day Post-Op Procedure(s) (LRB): RIGHT TOTAL HIP ARTHROPLASTY ANTERIOR APPROACH (Right) Principal Problem:   OA (osteoarthritis) of hip  Estimated body mass index is 32.84 kg/m as calculated from the following:   Height as of this encounter: 5\' 8"  (1.727 m).   Weight as of this encounter: 98 kg (216 lb). Advance diet Up with therapy Plan for discharge tomorrow Discharge home with home health  DVT Prophylaxis - Xarelto Weight Bearing As Tolerated right Leg Hemovac Pulled Begin Therapy  Arlee Muslim, PA-C Orthopaedic Surgery 04/23/2016, 8:59 AM

## 2016-04-23 NOTE — Progress Notes (Signed)
Physical Therapy Treatment Patient Details Name: JEANNEDARC KOBLE MRN: [id number] DOB: 1950/11/21 Today's Date: 05/09/16    History of Present Illness s/p R DA THA    PT Comments    Pt cooperative but progressing slowly with mobility 2* pain and fatigue.  Follow Up Recommendations  Home health PT     Equipment Recommendations  Rolling walker with 5" wheels    Recommendations for Other Services OT consult     Precautions / Restrictions Precautions Precautions: Fall Restrictions Weight Bearing Restrictions: No Other Position/Activity Restrictions: WBAT    Mobility  Bed Mobility Overal bed mobility: Needs Assistance Bed Mobility: Supine to Sit;Sit to Supine     Supine to sit: Min assist Sit to supine: Min assist   General bed mobility comments: cues for sequence and use of L LE to self assist  Transfers Overall transfer level: Needs assistance Equipment used: Rolling walker (2 wheeled) Transfers: Sit to/from Stand   Stand pivot transfers: Min assist       General transfer comment: cues for UE/LE placement  Ambulation/Gait Ambulation/Gait assistance: Min assist;+2 safety/equipment Ambulation Distance (Feet): 37 Feet Assistive device: Rolling walker (2 wheeled) Gait Pattern/deviations: Step-to pattern;Decreased step length - right;Decreased step length - left;Shuffle;Trunk flexed Gait velocity: decr Gait velocity interpretation: Below normal speed for age/gender General Gait Details: Increased time and cues for posture, position from RW and sequence   Stairs            Wheelchair Mobility    Modified Rankin (Stroke Patients Only)       Balance Overall balance assessment: No apparent balance deficits (not formally assessed)                                  Cognition Arousal/Alertness: Awake/alert Behavior During Therapy: WFL for tasks assessed/performed Overall Cognitive Status: Within Functional Limits for tasks assessed                       Exercises      General Comments        Pertinent Vitals/Pain Pain Assessment: 0-10 Pain Score: 6  Pain Location: R hip Pain Descriptors / Indicators: Aching;Sore Pain Intervention(s): Limited activity within patient's tolerance;Monitored during session;Premedicated before session;Ice applied    Home Living                      Prior Function            PT Goals (current goals can now be found in the care plan section) Acute Rehab PT Goals Patient Stated Goal: Regain IND PT Goal Formulation: With patient Time For Goal Achievement: 04/25/16 Potential to Achieve Goals: Good Progress towards PT goals: Progressing toward goals    Frequency    7X/week      PT Plan Current plan remains appropriate    Co-evaluation             End of Session Equipment Utilized During Treatment: Gait belt Activity Tolerance: Patient limited by fatigue;Other (comment) Patient left: in bed;with call bell/phone within reach;with family/visitor present     Time: [id number] PT Time Calculation (min) (ACUTE ONLY): 23 min  Charges:  $Gait Training: 23-37 mins                    G Codes:      Harlie Buening 05/09/16, 4:28 PM

## 2016-04-24 LAB — CBC
HCT: 28.9 % — ABNORMAL LOW (ref 36.0–46.0)
Hemoglobin: 9.2 g/dL — ABNORMAL LOW (ref 12.0–15.0)
MCH: 33.1 pg (ref 26.0–34.0)
MCHC: 31.8 g/dL (ref 30.0–36.0)
MCV: 104 fL — ABNORMAL HIGH (ref 78.0–100.0)
Platelets: 275 10*3/uL (ref 150–400)
RBC: 2.78 MIL/uL — ABNORMAL LOW (ref 3.87–5.11)
RDW: 16.1 % — ABNORMAL HIGH (ref 11.5–15.5)
WBC: 11.9 10*3/uL — ABNORMAL HIGH (ref 4.0–10.5)

## 2016-04-24 LAB — BASIC METABOLIC PANEL
Anion gap: 8 (ref 5–15)
BUN: 23 mg/dL — ABNORMAL HIGH (ref 6–20)
CALCIUM: 9 mg/dL (ref 8.9–10.3)
CO2: 26 mmol/L (ref 22–32)
CREATININE: 1 mg/dL (ref 0.44–1.00)
Chloride: 106 mmol/L (ref 101–111)
GFR calc non Af Amer: 57 mL/min — ABNORMAL LOW (ref >60)
Glucose, Bld: 120 mg/dL — ABNORMAL HIGH (ref 65–99)
Potassium: 4.3 mmol/L (ref 3.5–5.1)
SODIUM: 140 mmol/L (ref 135–145)

## 2016-04-24 LAB — GLUCOSE, CAPILLARY
GLUCOSE-CAPILLARY: 107 mg/dL — AB (ref 65–99)
Glucose-Capillary: 116 mg/dL — ABNORMAL HIGH (ref 65–99)
Glucose-Capillary: 84 mg/dL (ref 65–99)

## 2016-04-24 MED ORDER — SODIUM CHLORIDE 0.9 % IV BOLUS (SEPSIS)
250.0000 mL | Freq: Once | INTRAVENOUS | Status: AC
  Administered 2016-04-24: 250 mL via INTRAVENOUS

## 2016-04-24 MED ORDER — OXYCODONE HCL 5 MG PO TABS: 5.0000 mg | ORAL_TABLET | ORAL | 0 refills | Status: DC | PRN

## 2016-04-24 MED ORDER — RIVAROXABAN 10 MG PO TABS
10.0000 mg | ORAL_TABLET | Freq: Every day | ORAL | 0 refills | Status: DC
Start: 2016-04-25 — End: 2016-04-27

## 2016-04-24 MED ORDER — TRAMADOL HCL 50 MG PO TABS: 50.0000 mg | ORAL_TABLET | Freq: Four times a day (QID) | ORAL | 0 refills | Status: DC | PRN

## 2016-04-24 MED ORDER — METHOCARBAMOL 500 MG PO TABS: 500.0000 mg | ORAL_TABLET | Freq: Four times a day (QID) | ORAL | 0 refills | Status: DC | PRN

## 2016-04-24 NOTE — Progress Notes (Signed)
Physical Therapy Treatment Patient Details Name: Laura Neal MRN: [id number] DOB: Nov 01, 1950 Today's Date: 04/24/2016    History of Present Illness s/p R DA THA    PT Comments    Pt cooperative but continues limited by c/o heat and dizziness with ambulation.  Nursing performed orthostatics just prior to PT session with good results but after ambulating ~75' BP 95/50 with pt c/o dizziness.  RN aware.  Follow Up Recommendations  Home health PT     Equipment Recommendations  Rolling walker with 5" wheels    Recommendations for Other Services OT consult     Precautions / Restrictions Precautions Precautions: Fall Restrictions Weight Bearing Restrictions: No Other Position/Activity Restrictions: WBAT    Mobility  Bed Mobility     Rolling: Min assist;Mod assist (to replace chux)   Supine to sit: Min assist;Mod assist Sit to supine: Max assist;+2 for physical assistance   General bed mobility comments: OOB with nursing  Transfers Overall transfer level: Needs assistance Equipment used: Rolling walker (2 wheeled) Transfers: Sit to/from Stand Sit to Stand: Min assist         General transfer comment: cues for UE/LE placement  Ambulation/Gait Ambulation/Gait assistance: Min assist;+2 safety/equipment Ambulation Distance (Feet): 75 Feet Assistive device: Rolling walker (2 wheeled) Gait Pattern/deviations: Decreased step length - right;Decreased step length - left;Shuffle;Trunk flexed;Step-to pattern;Step-through pattern Gait velocity: decr Gait velocity interpretation: Below normal speed for age/gender General Gait Details: Increased time and cues for posture, position from RW and sequence   Stairs            Wheelchair Mobility    Modified Rankin (Stroke Patients Only)       Balance Overall balance assessment: No apparent balance deficits (not formally assessed)                                  Cognition Arousal/Alertness:  Awake/alert Behavior During Therapy: WFL for tasks assessed/performed Overall Cognitive Status: Within Functional Limits for tasks assessed                      Exercises      General Comments        Pertinent Vitals/Pain Pain Assessment: 0-10 Pain Score: 5  Faces Pain Scale: Hurts little more Pain Location: R hip Pain Descriptors / Indicators: Aching;Sore Pain Intervention(s): Limited activity within patient's tolerance;Monitored during session;Premedicated before session;Ice applied    Home Living                      Prior Function            PT Goals (current goals can now be found in the care plan section) Acute Rehab PT Goals Patient Stated Goal: Regain IND PT Goal Formulation: With patient Time For Goal Achievement: 04/25/16 Potential to Achieve Goals: Good Progress towards PT goals: Progressing toward goals    Frequency    7X/week      PT Plan Current plan remains appropriate    Co-evaluation             End of Session Equipment Utilized During Treatment: Gait belt Activity Tolerance: Patient limited by fatigue;Other (comment) Patient left: in bed;with call bell/phone within reach;with family/visitor present     Time: [id number] PT Time Calculation (min) (ACUTE ONLY): 20 min  Charges:  $Gait Training: 8-22 mins  G Codes:      Melecio Cueto 05/12/2016, 12:23 PM

## 2016-04-24 NOTE — Progress Notes (Signed)
Physical Therapy Treatment Patient Details Name: ELZBIETA COTTERILL MRN: [id number] DOB: 1950/05/24 Today's Date: 04/24/2016    History of Present Illness s/p R DA THA    PT Comments    POD # 2 pm session amb a limited distance then assisted back to bed.  Pt moving slowly and required increased time.  Performed some THR TE's then applied ICE.   Follow Up Recommendations  Home health PT     Equipment Recommendations  Rolling walker with 5" wheels    Recommendations for Other Services       Precautions / Restrictions Precautions Precautions: Fall Restrictions Weight Bearing Restrictions: No Other Position/Activity Restrictions: WBAT    Mobility  Bed Mobility Overal bed mobility: Needs Assistance Bed Mobility: Sit to Supine       Sit to supine: Mod assist   General bed mobility comments: assist with R LE up onto bed and use of trapeze to scoot to [facility]  Transfers Overall transfer level: Needs assistance Equipment used: Rolling walker (2 wheeled) Transfers: Sit to/from Stand Sit to Stand: Min assist Stand pivot transfers: Min assist       General transfer comment: 25% VC's on proper hand placement and R LE advancement  Ambulation/Gait Ambulation/Gait assistance: Min assist Ambulation Distance (Feet): 12 Feet Assistive device: Rolling walker (2 wheeled) Gait Pattern/deviations: Decreased step length - right;Decreased step length - left;Shuffle;Trunk flexed;Step-to pattern;Step-through pattern Gait velocity: decr   General Gait Details: Increased time and 25% cues for posture, position from RW and sequence.  No c/o dizziness just fatigue   Stairs            Wheelchair Mobility    Modified Rankin (Stroke Patients Only)       Balance                                    Cognition Arousal/Alertness: Awake/alert Behavior During Therapy: WFL for tasks assessed/performed Overall Cognitive Status: Within Functional Limits for tasks  assessed                      Exercises   Total Hip Replacement TE's 10 reps ankle pumps 10 reps knee presses 10 reps glut squeezes Followed by ICE     General Comments        Pertinent Vitals/Pain Pain Assessment: 0-10 Pain Score: 4  Pain Location: R hip with amb Pain Descriptors / Indicators: Sore;Tender;Tightness Pain Intervention(s): Monitored during session;Repositioned;Ice applied    Home Living                      Prior Function            PT Goals (current goals can now be found in the care plan section)      Frequency    7X/week      PT Plan Current plan remains appropriate    Co-evaluation             End of Session Equipment Utilized During Treatment: Gait belt Activity Tolerance: Patient limited by fatigue Patient left: in bed;with call bell/phone within reach;with bed alarm set     Time: 1535-1600 PT Time Calculation (min) (ACUTE ONLY): 25 min  Charges:  $Gait Training: 8-22 mins $Therapeutic Activity: 8-22 mins                    G Codes:  Rica Koyanagi  PTA [facility]  Acute  Rehab Pager      [phone]

## 2016-04-24 NOTE — Discharge Instructions (Addendum)
° °Dr. Frank Aluisio °Total Joint Specialist °[facility] °[address] °, [place] [address] °[phone] ° °ANTERIOR APPROACH TOTAL HIP REPLACEMENT POSTOPERATIVE DIRECTIONS ° ° °Hip Rehabilitation, Guidelines Following Surgery  °The results of a hip operation are greatly improved after range of motion and muscle strengthening exercises. Follow all safety measures which are given to protect your hip. If any of these exercises cause increased pain or swelling in your joint, decrease the amount until you are comfortable again. Then slowly increase the exercises. Call your caregiver if you have problems or questions.  ° °HOME CARE INSTRUCTIONS  °Remove items at home which could result in a fall. This includes throw rugs or furniture in walking pathways.  °· ICE to the affected hip every three hours for 30 minutes at a time and then as needed for pain and swelling.  Continue to use ice on the hip for pain and swelling from surgery. You may notice swelling that will progress down to the foot and ankle.  This is normal after surgery.  Elevate the leg when you are not up walking on it.   °· Continue to use the breathing machine which will help keep your temperature down.  It is common for your temperature to cycle up and down following surgery, especially at night when you are not up moving around and exerting yourself.  The breathing machine keeps your lungs expanded and your temperature down. ° ° °DIET °You may resume your previous home diet once your are discharged from the hospital. ° °DRESSING / WOUND CARE / SHOWERING °You may shower 3 days after surgery, but keep the wounds dry during showering.  You may use an occlusive plastic wrap (Press'n Seal for example), NO SOAKING/SUBMERGING IN THE BATHTUB.  If the bandage gets wet, change with a clean dry gauze.  If the incision gets wet, pat the wound dry with a clean towel. °You may start showering once you are discharged home but do not  submerge the incision under water. Just pat the incision dry and apply a dry gauze dressing on daily. °Change the surgical dressing daily and reapply a dry dressing each time. ° °ACTIVITY °Walk with your walker as instructed. °Use walker as long as suggested by your caregivers. °Avoid periods of inactivity such as sitting longer than an hour when not asleep. This helps prevent blood clots.  °You may resume a sexual relationship in one month or when given the OK by your doctor.  °You may return to work once you are cleared by your doctor.  °Do not drive a car for 6 weeks or until released by you surgeon.  °Do not drive while taking narcotics. ° °WEIGHT BEARING °Weight bearing as tolerated with assist device (walker, cane, etc) as directed, use it as long as suggested by your surgeon or therapist, typically at least 4-6 weeks. ° °POSTOPERATIVE CONSTIPATION PROTOCOL °Constipation - defined medically as fewer than three stools per week and severe constipation as less than one stool per week. ° °One of the most common issues patients have following surgery is constipation.  Even if you have a regular bowel pattern at home, your normal regimen is likely to be disrupted due to multiple reasons following surgery.  Combination of anesthesia, postoperative narcotics, change in appetite and fluid intake all can affect your bowels.  In order to avoid complications following surgery, here are some recommendations in order to help you during your recovery period. ° °Colace (docusate) - Pick up an over-the-counter   form of Colace or another stool softener and take twice a day as long as you are requiring postoperative pain medications.  Take with a full glass of water daily.  If you experience loose stools or diarrhea, hold the colace until you stool forms back up.  If your symptoms do not get better within 1 week or if they get worse, check with your doctor. ° °Dulcolax (bisacodyl) - Pick up over-the-counter and take as directed  by the product packaging as needed to assist with the movement of your bowels.  Take with a full glass of water.  Use this product as needed if not relieved by Colace only.  ° °MiraLax (polyethylene glycol) - Pick up over-the-counter to have on hand.  MiraLax is a solution that will increase the amount of water in your bowels to assist with bowel movements.  Take as directed and can mix with a glass of water, juice, soda, coffee, or tea.  Take if you go more than two days without a movement. °Do not use MiraLax more than once per day. Call your doctor if you are still constipated or irregular after using this medication for 7 days in a row. ° °If you continue to have problems with postoperative constipation, please contact the office for further assistance and recommendations.  If you experience "the worst abdominal pain ever" or develop nausea or vomiting, please contact the office immediatly for further recommendations for treatment. ° °ITCHING ° If you experience itching with your medications, try taking only a single pain pill, or even half a pain pill at a time.  You can also use Benadryl over the counter for itching or also to help with sleep.  ° °TED HOSE STOCKINGS °Wear the elastic stockings on both legs for three weeks following surgery during the day but you may remove then at night for sleeping. ° °MEDICATIONS °See your medication summary on the “After Visit Summary” that the nursing staff will review with you prior to discharge.  You may have some home medications which will be placed on hold until you complete the course of blood thinner medication.  It is important for you to complete the blood thinner medication as prescribed by your surgeon.  Continue your approved medications as instructed at time of discharge. ° °PRECAUTIONS °If you experience chest pain or shortness of breath - call 911 immediately for transfer to the hospital emergency department.  °If you develop a fever greater that 101 F,  purulent drainage from wound, increased redness or drainage from wound, foul odor from the wound/dressing, or calf pain - CONTACT YOUR SURGEON.   °                                                °FOLLOW-UP APPOINTMENTS °Make sure you keep all of your appointments after your operation with your surgeon and caregivers. You should call the office at the above phone number and make an appointment for approximately two weeks after the date of your surgery or on the date instructed by your surgeon outlined in the "After Visit Summary". ° °RANGE OF MOTION AND STRENGTHENING EXERCISES  °These exercises are designed to help you keep full movement of your hip joint. Follow your caregiver's or physical therapist's instructions. Perform all exercises about fifteen times, three times per day or as directed. Exercise both hips, even if you   have had only one joint replacement. These exercises can be done on a training (exercise) mat, on the floor, on a table or on a bed. Use whatever works the best and is most comfortable for you. Use music or television while you are exercising so that the exercises are a pleasant break in your day. This will make your life better with the exercises acting as a break in routine you can look forward to.  Lying on your back, slowly slide your foot toward your buttocks, raising your knee up off the floor. Then slowly slide your foot back down until your leg is straight again.  Lying on your back spread your legs as far apart as you can without causing discomfort.  Lying on your side, raise your upper leg and foot straight up from the floor as far as is comfortable. Slowly lower the leg and repeat.  Lying on your back, tighten up the muscle in the front of your thigh (quadriceps muscles). You can do this by keeping your leg straight and trying to raise your heel off the floor. This helps strengthen the largest muscle supporting your knee.  Lying on your back, tighten up the muscles of your  buttocks both with the legs straight and with the knee bent at a comfortable angle while keeping your heel on the floor.   IF YOU ARE TRANSFERRED TO A SKILLED REHAB FACILITY If the patient is transferred to a skilled rehab facility following release from the hospital, a list of the current medications will be sent to the facility for the patient to continue.  When discharged from the skilled rehab facility, please have the facility set up the patient's [place] prior to being released. Also, the skilled facility will be responsible for providing the patient with their medications at time of release from the facility to include their pain medication, the muscle relaxants, and their blood thinner medication. If the patient is still at the rehab facility at time of the two week follow up appointment, the skilled rehab facility will also need to assist the patient in arranging follow up appointment in our office and any transportation needs.  MAKE SURE YOU:  Understand these instructions.  Get help right away if you are not doing well or get worse.    Pick up stool softner and laxative for home use following surgery while on pain medications. Do not submerge incision under water. Please use good hand washing techniques while changing dressing each day. May shower starting three days after surgery. Please use a clean towel to pat the incision dry following showers. Continue to use ice for pain and swelling after surgery. Do not use any lotions or creams on the incision until instructed by your surgeon.  Take Xarelto for two and a half more weeks following discharge from the hospital, then discontinue Xarelto. Once the patient has completed the Xarelto, they may resume the 81 mg Aspirin.   Information on my medicine - XARELTO (Rivaroxaban)  This medication education was reviewed with me or my healthcare representative as part of my discharge preparation.  The pharmacist that  spoke with me during my hospital stay was:  [facility] Askar, Student-PharmD  Why was Xarelto prescribed for you? Xarelto was prescribed for you to reduce the risk of blood clots forming after orthopedic surgery. The medical term for these abnormal blood clots is venous thromboembolism (VTE).  What do you need to know about xarelto ? Take your Xarelto ONCE DAILY at  the same time every day. You may take it either with or without food.  If you have difficulty swallowing the tablet whole, you may crush it and mix in applesauce just prior to taking your dose.  Take Xarelto exactly as prescribed by your doctor and DO NOT stop taking Xarelto without talking to the doctor who prescribed the medication.  Stopping without other VTE prevention medication to take the place of Xarelto may increase your risk of developing a clot.  After discharge, you should have regular check-up appointments with your healthcare provider that is prescribing your Xarelto.    What do you do if you miss a dose? If you miss a dose, take it as soon as you remember on the same day then continue your regularly scheduled once daily regimen the next day. Do not take two doses of Xarelto on the same day.   Important Safety Information A possible side effect of Xarelto is bleeding. You should call your healthcare provider right away if you experience any of the following: ? Bleeding from an injury or your nose that does not stop. ? Unusual colored urine (red or dark brown) or unusual colored stools (red or black). ? Unusual bruising for unknown reasons. ? A serious fall or if you hit your head (even if there is no bleeding).  Some medicines may interact with Xarelto and might increase your risk of bleeding while on Xarelto. To help avoid this, consult your healthcare provider or pharmacist prior to using any new prescription or non-prescription medications, including herbals, vitamins, non-steroidal anti-inflammatory  drugs (NSAIDs) and supplements.  This website has more information on Xarelto: [email or web address]

## 2016-04-24 NOTE — Progress Notes (Signed)
   Subjective: 2 Days Post-Op Procedure(s) (LRB): RIGHT TOTAL HIP ARTHROPLASTY ANTERIOR APPROACH (Right) Patient reports pain as mild.   Patient seen in rounds with Dr. Wynelle Link.  Still with some incisional soreness.  Getting up with PT. Patient is well, but has had some minor complaints of pain in the hip, requiring pain medications Patient is ready to go home after therapy goals.  Objective: Vital signs in last 24 hours: Temp:  [98 F (36.7 C)-98.9 F (37.2 C)] 98.6 F (37 C) (01/26 0546) Pulse Rate:  [70-90] 90 (01/26 0546) Resp:  [18-19] 18 (01/26 0546) BP: (138-162)/(63-76) 138/63 (01/26 0546) SpO2:  [96 %-99 %] 96 % (01/26 0546)  Intake/Output from previous day:  Intake/Output Summary (Last 24 hours) at 04/24/16 0919 Last data filed at 04/24/16 0743  Gross per 24 hour  Intake          1964.17 ml  Output             3325 ml  Net         -1360.83 ml    Intake/Output this shift: Total I/O In: -  Out: 400 [Urine:400]  Labs:  Recent Labs  04/21/16 1917 04/23/16 0442 04/24/16 0432  HGB 12.2 10.7* 9.2*    Recent Labs  04/23/16 0442 04/24/16 0432  WBC 9.3 11.9*  RBC 3.15* 2.78*  HCT 32.6* 28.9*  PLT 264 275    Recent Labs  04/23/16 0442 04/24/16 0432  NA 139 140  K 3.8 4.3  CL 105 106  CO2 27 26  BUN 13 23*  CREATININE 1.16* 1.00  GLUCOSE 136* 120*  CALCIUM 8.5* 9.0   No results for input(s): LABPT, INR in the last 72 hours.  EXAM: General - Patient is Alert, Appropriate and Oriented Extremity - Neurovascular intact Sensation intact distally Dorsiflexion/Plantar flexion intact Incision - clean, dry, no drainage Motor Function - intact, moving foot and toes well on exam.   Assessment/Plan: 2 Days Post-Op Procedure(s) (LRB): RIGHT TOTAL HIP ARTHROPLASTY ANTERIOR APPROACH (Right) Procedure(s) (LRB): RIGHT TOTAL HIP ARTHROPLASTY ANTERIOR APPROACH (Right) Past Medical History:  Diagnosis Date  . Allergy   . Arthritis   . Blood transfusion    . Cancer ([place]) 2000   Kidney  . Diabetes mellitus    type II   . GERD (gastroesophageal reflux disease)   . Hyperlipidemia   . Hypertension   . Pneumonia    hx of   . Ulcer ([place])    Principal Problem:   OA (osteoarthritis) of hip  Estimated body mass index is 32.84 kg/m as calculated from the following:   Height as of this encounter: 5\' 8"  (1.727 m).   Weight as of this encounter: 98 kg (216 lb). Up with therapy Discharge home with home health Diet - Cardiac diet and Diabetic diet Follow up - in 2 weeks Activity - WBAT Disposition - Home Condition Upon Discharge - Good D/C Meds - See DC Summary DVT Prophylaxis - Xarelto  Arlee Muslim, PA-C Orthopaedic Surgery 04/24/2016, 9:19 AM

## 2016-04-24 NOTE — Progress Notes (Signed)
Occupational Therapy Treatment Patient Details Name: Laura Neal MRN: [id number] DOB: 1950/06/08 Today's Date: 04/24/2016    History of present illness s/p R DA THA   OT comments  Pt limited by decreased BP this session. RN to start bolus  Follow Up Recommendations  No OT follow up;Supervision/Assistance - 24 hour    Equipment Recommendations  3 in 1 bedside commode    Recommendations for Other Services      Precautions / Restrictions Precautions Precautions: Fall Restrictions Weight Bearing Restrictions: No Other Position/Activity Restrictions: WBAT       Mobility Bed Mobility     Rolling: Min assist;Mod assist (to replace chux)   Supine to sit: Min assist;Mod assist Sit to supine: Max assist;+2 for physical assistance   General bed mobility comments: needed assistance to assist pt back to lying. Rolled to replace chux  Transfers   Equipment used: Rolling walker (2 wheeled)   Sit to Stand: Min assist         General transfer comment: cues for UE/LE placement    Balance                                   ADL                                         General ADL Comments: Plan was to ambulate to bathroom, but pt likely orthostatic.  sitting BP 106/53, could not get standing BP as pt needed to sit and when repositioned in bed BP 114/58.  changed gown and chux pad.  Pt had used female urinal earlier and it was wet      Vision                     Perception     Praxis      Cognition   Behavior During Therapy: WFL for tasks assessed/performed Overall Cognitive Status: Within Functional Limits for tasks assessed                       Extremity/Trunk Assessment               Exercises     Shoulder Instructions       General Comments      Pertinent Vitals/ Pain       Faces Pain Scale: Hurts little more Pain Location: R hip Pain Descriptors / Indicators: Aching;Sore Pain Intervention(s):  Limited activity within patient's tolerance;Monitored during session;Premedicated before session;Repositioned  Home Living                                          Prior Functioning/Environment              Frequency           Progress Toward Goals  OT Goals(current goals can now be found in the care plan section)  Progress towards OT goals: Not progressing toward goals - comment (this session due to BP)     Plan      Co-evaluation                 End of Session     Activity Tolerance Other (comment) (limited  by BP)   Patient Left in bed;with call bell/phone within reach   Nurse Communication  (BP)        Time: [id number] OT Time Calculation (min): 19 min  Charges: OT General Charges $OT Visit: 1 Procedure OT Treatments $Therapeutic Activity: 8-22 mins  Mickle Campton 04/24/2016, 9:37 AM  Lesle Chris, OTR/L [phone] 04/24/2016

## 2016-04-24 NOTE — Discharge Summary (Signed)
Physician Discharge Summary   Patient ID: Laura Neal MRN: [id number] DOB/AGE: 1950-10-17 66 y.o.  Admit date: 04/22/2016 Discharge date: 04-27-2016  Primary Diagnosis:  Osteoarthritis of the Right hip.   Admission Diagnoses:  Past Medical History:  Diagnosis Date  . Allergy   . Arthritis   . Blood transfusion   . Cancer ([place]) 2000   Kidney  . Diabetes mellitus    type II   . GERD (gastroesophageal reflux disease)   . Hyperlipidemia   . Hypertension   . Pneumonia    hx of   . Ulcer ([place])    Discharge Diagnoses:   Principal Problem:   OA (osteoarthritis) of hip  Estimated body mass index is 32.84 kg/m as calculated from the following:   Height as of this encounter: '5\' 8"'$  (1.727 m).   Weight as of this encounter: 98 kg (216 lb).  Procedure(s) (LRB): RIGHT TOTAL HIP ARTHROPLASTY ANTERIOR APPROACH (Right)   Consults: None  HPI: Laura Neal is a 66 y.o. female who has advanced end-  stage arthritis of their Right  hip with progressively worsening pain and  dysfunction.The patient has failed nonoperative management and presents for  total hip arthroplasty.   Laboratory Data: Admission on 04/22/2016  Component Date Value Ref Range Status  . Glucose-Capillary 04/22/2016 101* 65 - 99 mg/dL Final  . Glucose-Capillary 04/22/2016 77  65 - 99 mg/dL Final  . Comment 1 04/22/2016 Notify RN   Final  . Comment 2 04/22/2016 Document in Chart   Final  . WBC 04/23/2016 9.3  4.0 - 10.5 K/uL Final  . RBC 04/23/2016 3.15* 3.87 - 5.11 MIL/uL Final  . Hemoglobin 04/23/2016 10.7* 12.0 - 15.0 g/dL Final  . HCT 04/23/2016 32.6* 36.0 - 46.0 % Final  . MCV 04/23/2016 103.5* 78.0 - 100.0 fL Final  . MCH 04/23/2016 34.0  26.0 - 34.0 pg Final  . MCHC 04/23/2016 32.8  30.0 - 36.0 g/dL Final  . RDW 04/23/2016 16.0* 11.5 - 15.5 % Final  . Platelets 04/23/2016 264  150 - 400 K/uL Final  . Sodium 04/23/2016 139  135 - 145 mmol/L Final  . Potassium 04/23/2016 3.8  3.5 - 5.1 mmol/L Final    . Chloride 04/23/2016 105  101 - 111 mmol/L Final  . CO2 04/23/2016 27  22 - 32 mmol/L Final  . Glucose, Bld 04/23/2016 136* 65 - 99 mg/dL Final  . BUN 04/23/2016 13  6 - 20 mg/dL Final  . Creatinine, Ser 04/23/2016 1.16* 0.44 - 1.00 mg/dL Final  . Calcium 04/23/2016 8.5* 8.9 - 10.3 mg/dL Final  . GFR calc non Af Amer 04/23/2016 48* >60 mL/min Final  . GFR calc Af Amer 04/23/2016 55* >60 mL/min Final   Comment: (NOTE) The eGFR has been calculated using the CKD EPI equation. This calculation has not been validated in all clinical situations. eGFR's persistently <60 mL/min signify possible Chronic Kidney Disease.   . Anion gap 04/23/2016 7  5 - 15 Final  . Glucose-Capillary 04/22/2016 187* 65 - 99 mg/dL Final  . Glucose-Capillary 04/23/2016 126* 65 - 99 mg/dL Final  . Glucose-Capillary 04/23/2016 138* 65 - 99 mg/dL Final  . Glucose-Capillary 04/23/2016 139* 65 - 99 mg/dL Final  . WBC 04/24/2016 11.9* 4.0 - 10.5 K/uL Final  . RBC 04/24/2016 2.78* 3.87 - 5.11 MIL/uL Final  . Hemoglobin 04/24/2016 9.2* 12.0 - 15.0 g/dL Final  . HCT 04/24/2016 28.9* 36.0 - 46.0 % Final  . MCV 04/24/2016 104.0* 78.0 -  100.0 fL Final  . MCH 04/24/2016 33.1  26.0 - 34.0 pg Final  . MCHC 04/24/2016 31.8  30.0 - 36.0 g/dL Final  . RDW 04/24/2016 16.1* 11.5 - 15.5 % Final  . Platelets 04/24/2016 275  150 - 400 K/uL Final  . Sodium 04/24/2016 140  135 - 145 mmol/L Final  . Potassium 04/24/2016 4.3  3.5 - 5.1 mmol/L Final  . Chloride 04/24/2016 106  101 - 111 mmol/L Final  . CO2 04/24/2016 26  22 - 32 mmol/L Final  . Glucose, Bld 04/24/2016 120* 65 - 99 mg/dL Final  . BUN 04/24/2016 23* 6 - 20 mg/dL Final  . Creatinine, Ser 04/24/2016 1.00  0.44 - 1.00 mg/dL Final  . Calcium 04/24/2016 9.0  8.9 - 10.3 mg/dL Final  . GFR calc non Af Amer 04/24/2016 57* >60 mL/min Final  . GFR calc Af Amer 04/24/2016 >60  >60 mL/min Final   Comment: (NOTE) The eGFR has been calculated using the CKD EPI equation. This  calculation has not been validated in all clinical situations. eGFR's persistently <60 mL/min signify possible Chronic Kidney Disease.   . Anion gap 04/24/2016 8  5 - 15 Final  . Glucose-Capillary 04/23/2016 147* 65 - 99 mg/dL Final  . Glucose-Capillary 04/24/2016 107* 65 - 99 mg/dL Final  . Glucose-Capillary 04/24/2016 116* 65 - 99 mg/dL Final  . WBC 04/25/2016 10.9* 4.0 - 10.5 K/uL Final  . RBC 04/25/2016 2.26* 3.87 - 5.11 MIL/uL Final  . Hemoglobin 04/25/2016 7.8* 12.0 - 15.0 g/dL Final  . HCT 04/25/2016 23.8* 36.0 - 46.0 % Final  . MCV 04/25/2016 105.3* 78.0 - 100.0 fL Final  . MCH 04/25/2016 34.5* 26.0 - 34.0 pg Final  . MCHC 04/25/2016 32.8  30.0 - 36.0 g/dL Final  . RDW 04/25/2016 16.7* 11.5 - 15.5 % Final  . Platelets 04/25/2016 226  150 - 400 K/uL Final  . Glucose-Capillary 04/24/2016 84  65 - 99 mg/dL Final  . Glucose-Capillary 04/24/2016 114* 65 - 99 mg/dL Final  . Comment 1 04/24/2016 Notify RN   Final  . Order Confirmation 04/25/2016 ORDER PROCESSED BY BLOOD BANK   Final  . Hemoglobin 04/25/2016 9.4* 12.0 - 15.0 g/dL Final  . HCT 04/25/2016 28.5* 36.0 - 46.0 % Final  . Glucose-Capillary 04/25/2016 70  65 - 99 mg/dL Final  . Glucose-Capillary 04/25/2016 55* 65 - 99 mg/dL Final  . Glucose-Capillary 04/25/2016 57* 65 - 99 mg/dL Final  . Glucose-Capillary 04/25/2016 90  65 - 99 mg/dL Final  . Comment 1 04/25/2016 Notify RN   Final  . Glucose-Capillary 04/25/2016 36* 65 - 99 mg/dL Final  . Glucose-Capillary 04/25/2016 70  65 - 99 mg/dL Final  . Comment 1 04/25/2016 Notify RN   Final  . Glucose-Capillary 04/25/2016 69  65 - 99 mg/dL Final  . Glucose-Capillary 04/25/2016 36* 65 - 99 mg/dL Final  . Comment 1 04/25/2016 Repeat Test   Final  . Glucose-Capillary 04/25/2016 115* 65 - 99 mg/dL Final  . Glucose-Capillary 04/26/2016 90  65 - 99 mg/dL Final  . Hemoglobin 04/26/2016 9.1* 12.0 - 15.0 g/dL Final  . HCT 04/26/2016 28.8* 36.0 - 46.0 % Final  . Glucose-Capillary  04/26/2016 117* 65 - 99 mg/dL Final  . Glucose-Capillary 04/26/2016 57* 65 - 99 mg/dL Final  . Glucose-Capillary 04/26/2016 32* 65 - 99 mg/dL Final  . Comment 1 04/26/2016 Notify RN   Final  . Glucose-Capillary 04/26/2016 98  65 - 99 mg/dL Final  . Comment  1 04/26/2016 Notify RN   Final  . Glucose-Capillary 04/26/2016 96  65 - 99 mg/dL Final  . Glucose-Capillary 04/27/2016 101* 65 - 99 mg/dL Final  Admission on 04/21/2016, Discharged on 04/21/2016  Component Date Value Ref Range Status  . Lipase 04/21/2016 15  11 - 51 U/L Final  . Sodium 04/21/2016 139  135 - 145 mmol/L Final  . Potassium 04/21/2016 3.8  3.5 - 5.1 mmol/L Final  . Chloride 04/21/2016 103  101 - 111 mmol/L Final  . CO2 04/21/2016 26  22 - 32 mmol/L Final  . Glucose, Bld 04/21/2016 89  65 - 99 mg/dL Final  . BUN 04/21/2016 17  6 - 20 mg/dL Final  . Creatinine, Ser 04/21/2016 1.18* 0.44 - 1.00 mg/dL Final  . Calcium 04/21/2016 9.0  8.9 - 10.3 mg/dL Final  . Total Protein 04/21/2016 6.9  6.5 - 8.1 g/dL Final  . Albumin 04/21/2016 3.7  3.5 - 5.0 g/dL Final  . AST 04/21/2016 19  15 - 41 U/L Final  . ALT 04/21/2016 15  14 - 54 U/L Final  . Alkaline Phosphatase 04/21/2016 63  38 - 126 U/L Final  . Total Bilirubin 04/21/2016 0.6  0.3 - 1.2 mg/dL Final  . GFR calc non Af Amer 04/21/2016 47* >60 mL/min Final  . GFR calc Af Amer 04/21/2016 54* >60 mL/min Final   Comment: (NOTE) The eGFR has been calculated using the CKD EPI equation. This calculation has not been validated in all clinical situations. eGFR's persistently <60 mL/min signify possible Chronic Kidney Disease.   . Anion gap 04/21/2016 10  5 - 15 Final  . WBC 04/21/2016 7.2  4.0 - 10.5 K/uL Final  . RBC 04/21/2016 3.64* 3.87 - 5.11 MIL/uL Final  . Hemoglobin 04/21/2016 12.2  12.0 - 15.0 g/dL Final  . HCT 04/21/2016 36.8  36.0 - 46.0 % Final  . MCV 04/21/2016 101.1* 78.0 - 100.0 fL Final  . MCH 04/21/2016 33.5  26.0 - 34.0 pg Final  . MCHC 04/21/2016 33.2  30.0 -  36.0 g/dL Final  . RDW 04/21/2016 15.5  11.5 - 15.5 % Final  . Platelets 04/21/2016 289  150 - 400 K/uL Final  . Color, Urine 04/21/2016 YELLOW  YELLOW Final  . APPearance 04/21/2016 CLEAR  CLEAR Final  . Specific Gravity, Urine 04/21/2016 1.014  1.005 - 1.030 Final  . pH 04/21/2016 7.0  5.0 - 8.0 Final  . Glucose, UA 04/21/2016 NEGATIVE  NEGATIVE mg/dL Final  . Hgb urine dipstick 04/21/2016 MODERATE* NEGATIVE Final  . Bilirubin Urine 04/21/2016 NEGATIVE  NEGATIVE Final  . Ketones, ur 04/21/2016 NEGATIVE  NEGATIVE mg/dL Final  . Protein, ur 04/21/2016 NEGATIVE  NEGATIVE mg/dL Final  . Nitrite 04/21/2016 NEGATIVE  NEGATIVE Final  . Leukocytes, UA 04/21/2016 NEGATIVE  NEGATIVE Final  . RBC / HPF 04/21/2016 0-5  0 - 5 RBC/hpf Final  . WBC, UA 04/21/2016 0-5  0 - 5 WBC/hpf Final  . Bacteria, UA 04/21/2016 NONE SEEN  NONE SEEN Final  . Squamous Epithelial / LPF 04/21/2016 NONE SEEN  NONE SEEN Final  Hospital Outpatient Visit on 04/17/2016  Component Date Value Ref Range Status  . aPTT 04/17/2016 33  24 - 36 seconds Final  . Prothrombin Time 04/17/2016 12.6  11.4 - 15.2 seconds Final  . INR 04/17/2016 0.95   Final  . ABO/RH(D) 04/17/2016 O POS   Final  . Antibody Screen 04/17/2016 NEG   Final  . Sample Expiration 04/17/2016 04/25/2016  Final  . Extend sample reason 04/17/2016 NO TRANSFUSIONS OR PREGNANCY IN THE PAST 3 MONTHS   Final  . Unit Number 04/17/2016 [id number]   Final  . Blood Component Type 04/17/2016 RED CELLS,LR   Final  . Unit division 04/17/2016 00   Final  . Status of Unit 04/17/2016 ISSUED   Final  . Transfusion Status 04/17/2016 OK TO TRANSFUSE   Final  . Crossmatch Result 04/17/2016 Compatible   Final  . Color, Urine 04/17/2016 YELLOW  YELLOW Final  . APPearance 04/17/2016 CLEAR  CLEAR Final  . Specific Gravity, Urine 04/17/2016 1.018  1.005 - 1.030 Final  . pH 04/17/2016 6.0  5.0 - 8.0 Final  . Glucose, UA 04/17/2016 NEGATIVE  NEGATIVE mg/dL Final  . Hgb urine  dipstick 04/17/2016 NEGATIVE  NEGATIVE Final  . Bilirubin Urine 04/17/2016 NEGATIVE  NEGATIVE Final  . Ketones, ur 04/17/2016 NEGATIVE  NEGATIVE mg/dL Final  . Protein, ur 32/98/7537 NEGATIVE  NEGATIVE mg/dL Final  . Nitrite 12/42/4004 NEGATIVE  NEGATIVE Final  . Leukocytes, UA 04/17/2016 NEGATIVE  NEGATIVE Final  . Hgb A1c MFr Bld 04/17/2016 5.1  4.8 - 5.6 % Final   Comment: (NOTE)         Pre-diabetes: 5.7 - 6.4         Diabetes: >6.4         Glycemic control for adults with diabetes: <7.0   . Mean Plasma Glucose 04/17/2016 100  mg/dL Final   Comment: (NOTE) Performed At: [facility] [address] [place], [place] [id number] Mila Homer MD MS:[id number]   . MRSA, PCR 04/17/2016 NEGATIVE  NEGATIVE Final  . Staphylococcus aureus 04/17/2016 NEGATIVE  NEGATIVE Final   Comment:        The Xpert SA Assay (FDA approved for NASAL specimens in patients over 64 years of age), is one component of a comprehensive surveillance program.  Test performance has been validated by [facility] for patients greater than or equal to 16 year old. It is not intended to diagnose infection nor to guide or monitor treatment.   . Glucose-Capillary 04/17/2016 69  65 - 99 mg/dL Final  . Glucose-Capillary 04/17/2016 79  65 - 99 mg/dL Final  . ABO/RH(D) 67/91/5069 O POS   Final  Appointment on 04/09/2016  Component Date Value Ref Range Status  . WBC 04/09/2016 6.9  3.9 - 10.3 10e3/uL Final  . NEUT# 04/09/2016 3.4  1.5 - 6.5 10e3/uL Final  . HGB 04/09/2016 13.7  11.6 - 15.9 g/dL Final  . HCT 12/91/6395 42.0  34.8 - 46.6 % Final  . Platelets 04/09/2016 327  145 - 400 10e3/uL Final  . MCV 04/09/2016 102.4* 79.5 - 101.0 fL Final  . MCH 04/09/2016 33.5  25.1 - 34.0 pg Final  . MCHC 04/09/2016 32.7  31.5 - 36.0 g/dL Final  . RBC 43/11/9297 4.10  3.70 - 5.45 10e6/uL Final  . RDW 04/09/2016 15.9* 11.2 - 14.5 % Final  . lymph# 04/09/2016 2.9  0.9 - 3.3 10e3/uL Final  . MONO# 04/09/2016 0.3   0.1 - 0.9 10e3/uL Final  . Eosinophils Absolute 04/09/2016 0.2  0.0 - 0.5 10e3/uL Final  . Basophils Absolute 04/09/2016 0.0  0.0 - 0.1 10e3/uL Final  . NEUT% 04/09/2016 50.1  38.4 - 76.8 % Final  . LYMPH% 04/09/2016 41.9  14.0 - 49.7 % Final  . MONO% 04/09/2016 4.8  0.0 - 14.0 % Final  . EOS% 04/09/2016 2.7  0.0 - 7.0 % Final  . BASO%  04/09/2016 0.5  0.0 - 2.0 % Final  . Sodium 04/09/2016 143  136 - 145 mEq/L Final  . Potassium 04/09/2016 3.5  3.5 - 5.1 mEq/L Final  . Chloride 04/09/2016 108  98 - 109 mEq/L Final  . CO2 04/09/2016 27  22 - 29 mEq/L Final  . Glucose 04/09/2016 94  70 - 140 mg/dl Final  . BUN 04/09/2016 17.0  7.0 - 26.0 mg/dL Final  . Creatinine 04/09/2016 1.4* 0.6 - 1.1 mg/dL Final  . Total Bilirubin 04/09/2016 0.63  0.20 - 1.20 mg/dL Final  . Alkaline Phosphatase 04/09/2016 86  40 - 150 U/L Final  . AST 04/09/2016 18  5 - 34 U/L Final  . ALT 04/09/2016 10  0 - 55 U/L Final  . Total Protein 04/09/2016 7.1  6.4 - 8.3 g/dL Final  . Albumin 04/09/2016 3.2* 3.5 - 5.0 g/dL Final  . Calcium 04/09/2016 9.3  8.4 - 10.4 mg/dL Final  . Anion Gap 04/09/2016 9  3 - 11 mEq/L Final  . EGFR 04/09/2016 46* >90 ml/min/1.73 m2 Final  Appointment on 03/05/2016  Component Date Value Ref Range Status  . WBC 03/05/2016 7.0  3.9 - 10.3 10e3/uL Final  . NEUT# 03/05/2016 3.9  1.5 - 6.5 10e3/uL Final  . HGB 03/05/2016 13.8  11.6 - 15.9 g/dL Final  . HCT 03/05/2016 42.4  34.8 - 46.6 % Final  . Platelets 03/05/2016 294  145 - 400 10e3/uL Final  . MCV 03/05/2016 103.4* 79.5 - 101.0 fL Final  . MCH 03/05/2016 33.7  25.1 - 34.0 pg Final  . MCHC 03/05/2016 32.5  31.5 - 36.0 g/dL Final  . RBC 03/05/2016 4.10  3.70 - 5.45 10e6/uL Final  . RDW 03/05/2016 15.4* 11.2 - 14.5 % Final  . lymph# 03/05/2016 2.4  0.9 - 3.3 10e3/uL Final  . MONO# 03/05/2016 0.5  0.1 - 0.9 10e3/uL Final  . Eosinophils Absolute 03/05/2016 0.2  0.0 - 0.5 10e3/uL Final  . Basophils Absolute 03/05/2016 0.0  0.0 - 0.1 10e3/uL  Final  . NEUT% 03/05/2016 55.4  38.4 - 76.8 % Final  . LYMPH% 03/05/2016 34.9  14.0 - 49.7 % Final  . MONO% 03/05/2016 6.9  0.0 - 14.0 % Final  . EOS% 03/05/2016 2.7  0.0 - 7.0 % Final  . BASO% 03/05/2016 0.1  0.0 - 2.0 % Final  . Sodium 03/05/2016 145  136 - 145 mEq/L Final  . Potassium 03/05/2016 3.9  3.5 - 5.1 mEq/L Final  . Chloride 03/05/2016 106  98 - 109 mEq/L Final  . CO2 03/05/2016 28  22 - 29 mEq/L Final  . Glucose 03/05/2016 73  70 - 140 mg/dl Final  . BUN 03/05/2016 14.8  7.0 - 26.0 mg/dL Final  . Creatinine 03/05/2016 1.4* 0.6 - 1.1 mg/dL Final  . Total Bilirubin 03/05/2016 0.69  0.20 - 1.20 mg/dL Final  . Alkaline Phosphatase 03/05/2016 91  40 - 150 U/L Final  . AST 03/05/2016 16  5 - 34 U/L Final  . ALT 03/05/2016 11  0 - 55 U/L Final  . Total Protein 03/05/2016 7.5  6.4 - 8.3 g/dL Final  . Albumin 03/05/2016 3.2* 3.5 - 5.0 g/dL Final  . Calcium 03/05/2016 9.6  8.4 - 10.4 mg/dL Final  . Anion Gap 03/05/2016 10  3 - 11 mEq/L Final  . EGFR 03/05/2016 46* >90 ml/min/1.73 m2 Final     X-Rays:Dg Pelvis Portable  Result Date: 04/22/2016 CLINICAL DATA:  Right hip replacement EXAM: PORTABLE  PELVIS 1-2 VIEWS COMPARISON:  None. FINDINGS: Interval right total hip arthroplasty without failure complication. No fracture or dislocation. Greater trochanteric fracture fragment is medially displaced. Postsurgical changes in the soft tissues surrounding the right hip. IMPRESSION: Interval right total hip arthroplasty. Electronically Signed   By: Kathreen Devoid   On: 04/22/2016 19:06   Dg C-arm 1-60 Min-no Report  Result Date: 04/22/2016 There is no Radiologist interpretation  for this exam.   EKG: Orders placed or performed during the hospital encounter of 04/17/16  . EKG 12-Lead  . EKG 12-Lead     Hospital Course: Patient was admitted to [facility] and taken to the OR and underwent the above state procedure without complications.  Patient tolerated the procedure well and  was later transferred to the recovery room and then to the orthopaedic floor for postoperative care.  They were given PO and IV analgesics for pain control following their surgery.  They were given 24 hours of postoperative antibiotics of  Anti-infectives    Start     Dose/Rate Route Frequency Ordered Stop   04/22/16 2200  ceFAZolin (ANCEF) IVPB 2g/100 mL premix     2 g 200 mL/hr over 30 Minutes Intravenous Every 6 hours 04/22/16 1812 04/23/16 0452   04/22/16 1245  ceFAZolin (ANCEF) IVPB 2g/100 mL premix     2 g 200 mL/hr over 30 Minutes Intravenous On call to O.R. 04/22/16 1241 04/22/16 1505     and started on DVT prophylaxis in the form of Xarelto.   PT and OT were ordered for total hip protocol.  The patient was allowed to be WBAT with therapy. Discharge planning was consulted to help with postop disposition and equipment needs.  Patient had a decent night on the evening of surgery.  They started to get up OOB with therapy on day one.  Hemovac drain was pulled without difficulty.  Continued to work with therapy into day two.  Dressing was changed on day two and the incision was healing well.  By day three, the patient continued to work with therapy but continued to be limited by c/o heat and dizziness with ambulation. Incision was healing well.  Transfused 1 unit PRBCs for symptomatic acute blood loss anemia post-op. On day four, still complained of dizziness with ambulation.  Social worker consulted to help look into a SNF bed. Seen on rounds on POD by Dr. Wynelle Link.  Events of weekend reviewed and setup for transfer to a SNF.  Plan for transfer today as long as arrangements completed.  Discharge to SNF Diet - Cardiac diet and Diabetic diet Follow up - in 2 weeks Activity - WBAT Disposition - Skilled nursing facility Condition Upon Discharge - Stable D/C Meds - See DC Summary DVT Prophylaxis - Xarelto   Discharge Instructions    Call MD / Call 911    Complete by:  As directed    If you  experience chest pain or shortness of breath, CALL 911 and be transported to the hospital emergency room.  If you develope a fever above 101 F, pus (white drainage) or increased drainage or redness at the wound, or calf pain, call your surgeon's office.   Change dressing    Complete by:  As directed    You may change your dressing dressing daily with sterile 4 x 4 inch gauze dressing and paper tape.  Do not submerge the incision under water.   Constipation Prevention    Complete by:  As directed  Drink plenty of fluids.  Prune juice may be helpful.  You may use a stool softener, such as Colace (over the counter) 100 mg twice a day.  Use MiraLax (over the counter) for constipation as needed.   Diet - low sodium heart healthy    Complete by:  As directed    Diet Carb Modified    Complete by:  As directed    Discharge instructions    Complete by:  As directed    Pick up stool softner and laxative for home use following surgery while on pain medications. Do not submerge incision under water. Please use good hand washing techniques while changing dressing each day. May shower starting three days after surgery. Please use a clean towel to pat the incision dry following showers. Continue to use ice for pain and swelling after surgery. Do not use any lotions or creams on the incision until instructed by your surgeon.  Wear both TED hose on both legs during the day every day for three weeks, but may have off at night at home.  Postoperative Constipation Protocol  Constipation - defined medically as fewer than three stools per week and severe constipation as less than one stool per week.  One of the most common issues patients have following surgery is constipation.  Even if you have a regular bowel pattern at home, your normal regimen is likely to be disrupted due to multiple reasons following surgery.  Combination of anesthesia, postoperative narcotics, change in appetite and fluid intake all  can affect your bowels.  In order to avoid complications following surgery, here are some recommendations in order to help you during your recovery period.  Colace (docusate) - Pick up an over-the-counter form of Colace or another stool softener and take twice a day as long as you are requiring postoperative pain medications.  Take with a full glass of water daily.  If you experience loose stools or diarrhea, hold the colace until you stool forms back up.  If your symptoms do not get better within 1 week or if they get worse, check with your doctor.  Dulcolax (bisacodyl) - Pick up over-the-counter and take as directed by the product packaging as needed to assist with the movement of your bowels.  Take with a full glass of water.  Use this product as needed if not relieved by Colace only.   MiraLax (polyethylene glycol) - Pick up over-the-counter to have on hand.  MiraLax is a solution that will increase the amount of water in your bowels to assist with bowel movements.  Take as directed and can mix with a glass of water, juice, soda, coffee, or tea.  Take if you go more than two days without a movement. Do not use MiraLax more than once per day. Call your doctor if you are still constipated or irregular after using this medication for 7 days in a row.  If you continue to have problems with postoperative constipation, please contact the office for further assistance and recommendations.  If you experience "the worst abdominal pain ever" or develop nausea or vomiting, please contact the office immediatly for further recommendations for treatment.   Take Xarelto for two and a half more weeks, then discontinue Xarelto. Once the patient has completed the Xarelto, they may resume the 81 mg Aspirin.     Do not sit on low chairs, stoools or toilet seats, as it may be difficult to get up from low surfaces    Complete by:  As directed    Driving restrictions    Complete by:  As directed    No driving until  released by the physician.   Increase activity slowly as tolerated    Complete by:  As directed    Lifting restrictions    Complete by:  As directed    No lifting until released by the physician.   Patient may shower    Complete by:  As directed    You may shower without a dressing once there is no drainage.  Do not wash over the wound.  If drainage remains, do not shower until drainage stops.   TED hose    Complete by:  As directed    Use stockings (TED hose) for 3 weeks on both leg(s).  You may remove them at night for sleeping.   Weight bearing as tolerated    Complete by:  As directed    Laterality:  right   Extremity:  Lower     Allergies as of 04/27/2016      Reactions   Codeine Rash      Medication List    STOP taking these medications   aspirin 81 MG tablet   clobetasol cream 0.05 % Commonly known as:  TEMOVATE   ibuprofen 200 MG tablet Commonly known as:  ADVIL,MOTRIN   naproxen sodium 220 MG tablet Commonly known as:  ANAPROX     TAKE these medications   acetaminophen 325 MG tablet Commonly known as:  TYLENOL Take 2 tablets (650 mg total) by mouth every 6 (six) hours as needed for mild pain (or Fever >/= 101).   bisacodyl 10 MG suppository Commonly known as:  DULCOLAX Place 1 suppository (10 mg total) rectally daily as needed for moderate constipation.   BYETTA 10 MCG PEN 10 MCG/0.04ML Sopn injection Generic drug:  exenatide Inject 10 mg into the skin 2 (two) times daily.   docusate sodium 100 MG capsule Commonly known as:  COLACE Take 1 capsule (100 mg total) by mouth 2 (two) times daily as needed for mild constipation.   fluticasone 50 MCG/ACT nasal spray Commonly known as:  FLONASE Place 2 sprays into the nose daily. What changed:  when to take this  reasons to take this   furosemide 20 MG tablet Commonly known as:  LASIX Take 20 mg by mouth daily as needed for fluid or edema.   insulin glargine 100 UNIT/ML injection Commonly known as:   LANTUS Inject 30 Units into the skin at bedtime.   loperamide 2 MG capsule Commonly known as:  IMODIUM Take 2 mg by mouth as needed for diarrhea or loose stools (side effects of Votrient.).   losartan-hydrochlorothiazide 100-25 MG tablet Commonly known as:  HYZAAR Take 1 tablet by mouth daily.   methocarbamol 500 MG tablet Commonly known as:  ROBAXIN Take 1 tablet (500 mg total) by mouth every 6 (six) hours as needed for muscle spasms.   oxyCODONE 5 MG immediate release tablet Commonly known as:  Oxy IR/ROXICODONE Take 1-2 tablets (5-10 mg total) by mouth every 4 (four) hours as needed for moderate pain or severe pain.   pazopanib 200 MG tablet Commonly known as:  VOTRIENT Take 3 tablets (600 mg total) by mouth daily. Take on an empty stomach.   polyethylene glycol packet Commonly known as:  MIRALAX / GLYCOLAX Take 17 g by mouth daily as needed for mild constipation.   prochlorperazine 10 MG tablet Commonly known as:  COMPAZINE Take 1 tablet (10 mg  total) by mouth every 6 (six) hours as needed for nausea or vomiting.   rivaroxaban 10 MG Tabs tablet Commonly known as:  XARELTO Take 1 tablet (10 mg total) by mouth daily with breakfast. Take Xarelto for two and a half more weeks following discharge from the hospital, then discontinue Xarelto. Once the patient has completed the Xarelto, they may resume the 81 mg Aspirin.   rosuvastatin 10 MG tablet Commonly known as:  CRESTOR Take 10 mg by mouth at bedtime.   sodium phosphate 7-19 GM/118ML Enem Place 133 mLs (1 enema total) rectally once as needed for severe constipation.   traMADol 50 MG tablet Commonly known as:  ULTRAM Take 1-2 tablets (50-100 mg total) by mouth every 6 (six) hours as needed (mild pain). What changed:  how much to take  reasons to take this            Durable Medical Equipment        Start     Ordered   04/23/16 1143  For home use only DME 3 n 1  Once     04/23/16 1143   04/23/16 1143   For home use only DME Walker rolling  Once    Question:  Patient needs a walker to treat with the following condition  Answer:  OA (osteoarthritis) of hip   04/23/16 1143     Follow-up Information    [facility] Follow up.   Specialty:  Home Health Services Why:  home health physical therapy Contact information: [address] [place] [address] [phone]        [facility] - [facility] Follow up.   Why:  rolling walker and 3n1 Contact information: [place] [address] [phone]        Gearlean Alf, MD. Schedule an appointment as soon as possible for a visit on 05/05/2016.   Specialty:  Orthopedic Surgery Contact information: [address] [place] [address] [phone]           Signed: Arlee Muslim, PA-C Orthopaedic Surgery 04/27/2016, 7:51 AM

## 2016-04-24 NOTE — Progress Notes (Signed)
Occupational Therapy Treatment Patient Details Name: AFTON GARVIE MRN: [id number] DOB: 05-05-1950 Today's Date: 04/24/2016    History of present illness s/p R DA THA   OT comments  Performed spt to 3:1 due to need.  Pt with increased pain and decreased activity tolerance  Follow Up Recommendations  No OT follow up;Supervision/Assistance - 24 hour    Equipment Recommendations  3 in 1 bedside commode    Recommendations for Other Services      Precautions / Restrictions Precautions Precautions: Fall Restrictions Other Position/Activity Restrictions: WBAT       Mobility Bed Mobility                  Transfers   Equipment used: Rolling walker (2 wheeled)   Sit to Stand: Min assist Stand pivot transfers: Min assist       General transfer comment: cues for UE/LE placement    Balance                                   ADL                           Toilet Transfer: Minimal assistance;BSC;RW;Stand-pivot   Toileting- Clothing Manipulation and Hygiene: Minimal assistance;Sit to/from stand (for sit to stand and min guard for balance standing)         General ADL Comments: Initially, pt thought she didn't need pain medication.  When she scooted forward in chair, she decided to request this.  Pt had to use commode, so performed SPT to 3:1 then back to chair.  Pt continues to have decreased endurance      Vision                     Perception     Praxis      Cognition   Behavior During Therapy: WFL for tasks assessed/performed Overall Cognitive Status: Within Functional Limits for tasks assessed                       Extremity/Trunk Assessment               Exercises     Shoulder Instructions       General Comments      Pertinent Vitals/ Pain       Pain Score: 8  Pain Location: R hip with movement Pain Descriptors / Indicators: Aching Pain Intervention(s): Limited activity within patient's  tolerance;Monitored during session;Repositioned;Patient requesting pain meds-RN notified (NT to bring ice; machine being cleaned)  Home Living                                          Prior Functioning/Environment              Frequency  Min 2X/week        Progress Toward Goals  OT Goals(current goals can now be found in the care plan section)  Progress towards OT goals: Progressing toward goals (slowly)     Plan      Co-evaluation                 End of Session     Activity Tolerance Patient limited by fatigue;Patient limited by pain   Patient Left in chair;with  call bell/phone within reach;with chair alarm set   Nurse Communication Patient requests pain meds        Time: 1443-1501 OT Time Calculation (min): 18 min  Charges: OT General Charges $OT Visit: 1 Procedure OT Treatments $Self Care/Home Management : 8-22 mins  Ely Spragg 04/24/2016, 4:02 PM  Lesle Chris, OTR/L [phone] 04/24/2016

## 2016-04-25 LAB — GLUCOSE, CAPILLARY
GLUCOSE-CAPILLARY: 70 mg/dL (ref 65–99)
GLUCOSE-CAPILLARY: 55 mg/dL — AB (ref 65–99)
GLUCOSE-CAPILLARY: 36 mg/dL — AB (ref 65–99)
GLUCOSE-CAPILLARY: 69 mg/dL (ref 65–99)
GLUCOSE-CAPILLARY: 36 mg/dL — AB (ref 65–99)
Glucose-Capillary: 114 mg/dL — ABNORMAL HIGH (ref 65–99)
Glucose-Capillary: 57 mg/dL — ABNORMAL LOW (ref 65–99)
Glucose-Capillary: 90 mg/dL (ref 65–99)
Glucose-Capillary: 70 mg/dL (ref 65–99)
Glucose-Capillary: 115 mg/dL — ABNORMAL HIGH (ref 65–99)

## 2016-04-25 LAB — CBC
HEMATOCRIT: 23.8 % — AB (ref 36.0–46.0)
HEMOGLOBIN: 7.8 g/dL — AB (ref 12.0–15.0)
MCH: 34.5 pg — ABNORMAL HIGH (ref 26.0–34.0)
MCHC: 32.8 g/dL (ref 30.0–36.0)
MCV: 105.3 fL — AB (ref 78.0–100.0)
Platelets: 226 10*3/uL (ref 150–400)
RBC: 2.26 MIL/uL — ABNORMAL LOW (ref 3.87–5.11)
RDW: 16.7 % — ABNORMAL HIGH (ref 11.5–15.5)
WBC: 10.9 10*3/uL — ABNORMAL HIGH (ref 4.0–10.5)

## 2016-04-25 LAB — PREPARE RBC (CROSSMATCH)

## 2016-04-25 LAB — HEMOGLOBIN AND HEMATOCRIT, BLOOD
HEMATOCRIT: 28.5 % — AB (ref 36.0–46.0)
Hemoglobin: 9.4 g/dL — ABNORMAL LOW (ref 12.0–15.0)

## 2016-04-25 MED ORDER — DEXTROSE 50 % IV SOLN
INTRAVENOUS | Status: AC
  Filled 2016-04-25: qty 50

## 2016-04-25 MED ORDER — SODIUM CHLORIDE 0.9 % IV SOLN: Freq: Once | INTRAVENOUS | Status: DC

## 2016-04-25 NOTE — Progress Notes (Signed)
Notified Dierdre Highman, [place], re pt's hypoglycemia: CBG 55 @ 1209; 57 @ 1234 after 2 OJs & 2 sugar packets; 90 @ 1257 after eating lunch. Acknowledged info. Renaye Janicki, [employer]

## 2016-04-25 NOTE — Progress Notes (Signed)
Physical Therapy Treatment Patient Details Name: Laura Neal MRN: [id number] DOB: 1950/11/09 Today's Date: 2016/05/17    History of Present Illness s/p R DA THA    PT Comments    Therex performed.  OOB deferred to completion of blood transfusion.  Follow Up Recommendations  Home health PT     Equipment Recommendations  Rolling walker with 5" wheels    Recommendations for Other Services OT consult     Precautions / Restrictions Precautions Precautions: Fall Restrictions Weight Bearing Restrictions: No Other Position/Activity Restrictions: WBAT    Mobility  Bed Mobility               General bed mobility comments: OOB deferred - transfusion in progress  Transfers                    Ambulation/Gait                 Stairs            Wheelchair Mobility    Modified Rankin (Stroke Patients Only)       Balance                                    Cognition Arousal/Alertness: Awake/alert Behavior During Therapy: WFL for tasks assessed/performed Overall Cognitive Status: Within Functional Limits for tasks assessed                      Exercises Total Joint Exercises Ankle Circles/Pumps: AROM;Both;15 reps;Supine Quad Sets: AROM;Both;10 reps;Supine Gluteal Sets: AROM;Both;10 reps;Supine Heel Slides: AAROM;Right;20 reps;Supine Hip ABduction/ADduction: AAROM;Right;15 reps;Supine    General Comments        Pertinent Vitals/Pain Pain Assessment: 0-10 Pain Score: 4  Pain Location: R hip Pain Descriptors / Indicators: Sore;Tender;Tightness Pain Intervention(s): Limited activity within patient's tolerance;Monitored during session;Premedicated before session;Ice applied    Home Living                      Prior Function            PT Goals (current goals can now be found in the care plan section) Acute Rehab PT Goals Patient Stated Goal: Regain IND PT Goal Formulation: With patient Time For  Goal Achievement: 05/17/2016 Potential to Achieve Goals: Good Progress towards PT goals: Progressing toward goals    Frequency    7X/week      PT Plan Current plan remains appropriate    Co-evaluation             End of Session Equipment Utilized During Treatment: Gait belt Activity Tolerance: Patient tolerated treatment well Patient left: in bed;with call bell/phone within reach;with bed alarm set     Time: 1047-1103 PT Time Calculation (min) (ACUTE ONLY): 16 min  Charges:  $Therapeutic Exercise: 8-22 mins                    G Codes:      Elanie Hammitt 05/17/16, 12:27 PM

## 2016-04-25 NOTE — Care Management Important Message (Signed)
Important Message  Patient Details  Name: Laura Neal MRN: [id number] Date of Birth: [birth year]   Medicare Important Message Given:  Yes    Erenest Rasher, RN 04/25/2016, 10:26 AM

## 2016-04-25 NOTE — Progress Notes (Signed)
NT obtained cbg=36 but noted that the patient exhibited no s/s of hypoglycemia. Confirmed this myself and rechecked CBG, result 70. Burnette Sautter, [employer]

## 2016-04-25 NOTE — Progress Notes (Signed)
Subjective: 3 Days Post-Op Procedure(s) (LRB): RIGHT TOTAL HIP ARTHROPLASTY ANTERIOR APPROACH (Right) Patient reports pain as mild.   Was feeling dizzy yesterday when up with PT Reports BM overnight which was dark and she is concerned about blood in her stool, apparently was in the ER last week with some bleeding which she was told was due to a hemorrhoid. Otherwise not OOB yet today to report symptoms Pain at R hip  Objective: Vital signs in last 24 hours: Temp:  [97.8 F (36.6 C)-98.6 F (37 C)] 98.6 F (37 C) (01/27 0600) Pulse Rate:  [73-80] 73 (01/27 0600) Resp:  [18-19] 18 (01/27 0600) BP: (95-140)/(50-61) 123/60 (01/27 0600) SpO2:  [95 %-99 %] 96 % (01/27 0600)  Intake/Output from previous day: 01/26 0701 - 01/27 0700 In: 850 [P.O.:600; I.V.:250] Out: 900 [Urine:900] Intake/Output this shift: No intake/output data recorded.   Recent Labs  04/23/16 0442 04/24/16 0432 04/25/16 0442  HGB 10.7* 9.2* 7.8*    Recent Labs  04/24/16 0432 04/25/16 0442  WBC 11.9* 10.9*  RBC 2.78* 2.26*  HCT 28.9* 23.8*  PLT 275 226    Recent Labs  04/23/16 0442 04/24/16 0432  NA 139 140  K 3.8 4.3  CL 105 106  CO2 27 26  BUN 13 23*  CREATININE 1.16* 1.00  GLUCOSE 136* 120*  CALCIUM 8.5* 9.0   No results for input(s): LABPT, INR in the last 72 hours.  Neurologically intact ABD soft Neurovascular intact Sensation intact distally Intact pulses distally Dorsiflexion/Plantar flexion intact Incision: dressing C/D/I and no drainage No cellulitis present Compartment soft no calf pain or sign of DVT  Assessment/Plan: 3 Days Post-Op Procedure(s) (LRB): RIGHT TOTAL HIP ARTHROPLASTY ANTERIOR APPROACH (Right) Advance diet Up with therapy D/C IV fluids  Transfuse 1 unit PRBCs today for symptomatic acute blood loss anemia post-op Test stool for heme today- ordered H&H this afternoon If feeling better following transfusion and stool is heme negative would consider  possible D/C later today, otherwise may need to wait until tomorrow.  BISSELL, JACLYN M. 04/25/2016, 7:23 AM

## 2016-04-25 NOTE — Progress Notes (Signed)
Physical Therapy Treatment Patient Details Name: Laura Neal MRN: [id number] DOB: 10-07-1950 Today's Date: 04/25/2016    History of Present Illness s/p R DA THA    PT Comments    Pt progressing slowly - limited this date by fatigue.  Follow Up Recommendations  Home health PT     Equipment Recommendations  Rolling walker with 5" wheels    Recommendations for Other Services OT consult     Precautions / Restrictions Precautions Precautions: Fall Restrictions Weight Bearing Restrictions: No Other Position/Activity Restrictions: WBAT    Mobility  Bed Mobility Overal bed mobility: Needs Assistance Bed Mobility: Supine to Sit     Supine to sit: Min assist;Mod assist     General bed mobility comments: cues for sequence and use of L LE to self assist  Transfers Overall transfer level: Needs assistance Equipment used: Rolling walker (2 wheeled) Transfers: Sit to/from Stand Sit to Stand: Min assist Stand pivot transfers: Min assist       General transfer comment: cues for LE management and use of UEs to self assist.  Stand pvt bed to [facility]  Ambulation/Gait Ambulation/Gait assistance: Min assist Ambulation Distance (Feet): 34 Feet Assistive device: Rolling walker (2 wheeled) Gait Pattern/deviations: Decreased step length - right;Decreased step length - left;Shuffle;Trunk flexed;Step-to pattern;Step-through pattern Gait velocity: decr Gait velocity interpretation: Below normal speed for age/gender General Gait Details: cues for sequence, posture and position from RW.  Distance ltd by pt fatigue and c/o mild dizziness - BP 132/54   Stairs            Wheelchair Mobility    Modified Rankin (Stroke Patients Only)       Balance                                    Cognition Arousal/Alertness: Awake/alert Behavior During Therapy: WFL for tasks assessed/performed Overall Cognitive Status: Within Functional Limits for tasks assessed                       Exercises      General Comments        Pertinent Vitals/Pain Pain Assessment: 0-10 Pain Score: 5  Pain Location: R hip Pain Descriptors / Indicators: Sore;Tender;Tightness Pain Intervention(s): Limited activity within patient's tolerance;Monitored during session;Patient requesting pain meds-RN notified;Ice applied    Home Living                      Prior Function            PT Goals (current goals can now be found in the care plan section) Acute Rehab PT Goals Patient Stated Goal: Regain IND PT Goal Formulation: With patient Time For Goal Achievement: 04/25/16 Potential to Achieve Goals: Good Progress towards PT goals: Progressing toward goals    Frequency    7X/week      PT Plan Current plan remains appropriate    Co-evaluation             End of Session Equipment Utilized During Treatment: Gait belt Activity Tolerance: Patient limited by fatigue Patient left: in chair;with call bell/phone within reach     Time: [id number] PT Time Calculation (min) (ACUTE ONLY): 29 min  Charges:  $Gait Training: 8-22 mins $Therapeutic Activity: 8-22 mins                    G Codes:  Kathy Wahid 04/25/2016, 4:09 PM

## 2016-04-26 LAB — GLUCOSE, CAPILLARY
GLUCOSE-CAPILLARY: 90 mg/dL (ref 65–99)
GLUCOSE-CAPILLARY: 32 mg/dL — AB (ref 65–99)
Glucose-Capillary: 117 mg/dL — ABNORMAL HIGH (ref 65–99)
Glucose-Capillary: 57 mg/dL — ABNORMAL LOW (ref 65–99)
Glucose-Capillary: 98 mg/dL (ref 65–99)

## 2016-04-26 LAB — HEMOGLOBIN AND HEMATOCRIT, BLOOD
HEMATOCRIT: 28.8 % — AB (ref 36.0–46.0)
HEMOGLOBIN: 9.1 g/dL — AB (ref 12.0–15.0)

## 2016-04-26 NOTE — Progress Notes (Signed)
    Subjective: 4 Days Post-Op Procedure(s) (LRB): RIGHT TOTAL HIP ARTHROPLASTY ANTERIOR APPROACH (Right) Patient reports pain as 5 on 0-10 scale.   Denies CP or SOB.  Voiding without difficulty. Positive flatus. I was told the 669 blood sugar reading may not have been correct.  Pt denies dizziness symptoms with ambulation. Objective: Vital signs in last 24 hours: Temp:  [98.4 F (36.9 C)-98.9 F (37.2 C)] 98.6 F (37 C) (01/28 0615) Pulse Rate:  [73-93] 80 (01/28 0615) Resp:  [16-20] 18 (01/28 0615) BP: (112-132)/(54-59) 112/59 (01/28 0615) SpO2:  [97 %-100 %] 97 % (01/28 0615)  Intake/Output from previous day: 01/27 0701 - 01/28 0700 In: 1266 [P.O.:960; Blood:306] Out: -  Intake/Output this shift: No intake/output data recorded.  Labs:  Recent Labs  04/24/16 0432 04/25/16 0442 04/25/16 1701  HGB 9.2* 7.8* 9.4*    Recent Labs  04/24/16 0432 04/25/16 0442 04/25/16 1701  WBC 11.9* 10.9*  --   RBC 2.78* 2.26*  --   HCT 28.9* 23.8* 28.5*  PLT 275 226  --     Recent Labs  04/24/16 0432  NA 140  K 4.3  CL 106  CO2 26  BUN 23*  CREATININE 1.00  GLUCOSE 120*  CALCIUM 9.0   No results for input(s): LABPT, INR in the last 72 hours.  Physical Exam: Sensation intact distally Dorsiflexion/Plantar flexion intact Incision: dressing C/D/I Compartment soft  Assessment/Plan: 4 Days Post-Op Procedure(s) (LRB): RIGHT TOTAL HIP ARTHROPLASTY ANTERIOR APPROACH (Right) Advance diet Up with therapy  Continue to monitor BS Ordered another H & H Consider DC today or tomorrow  Laura Neal, Laura Neal for Dr. Melina Schools [facility] [phone] 04/26/2016, 8:51 AM    Patient ID: Laura Neal, female   DOB: 09-10-1950, 66 y.o.   MRN: [id number]

## 2016-04-26 NOTE — Progress Notes (Signed)
Physical Therapy Treatment Patient Details Name: GENIA KAMINSKA MRN: [id number] DOB: 12-04-1950 Today's Date: 04/26/2016    History of Present Illness s/p R DA THA with post op anemia    PT Comments    POD # 4 pm session Assisted out of recliner to amb greater distance.   Max c/o fatigue and 6/10 hip pain.  Pain meds requested during session.  Assisted back to recliner and applied fresh ICE to R hip.     Follow Up Recommendations  SNF [facility])     Equipment Recommendations       Recommendations for Other Services       Precautions / Restrictions Precautions Precautions: Fall Restrictions Weight Bearing Restrictions: No Other Position/Activity Restrictions: WBAT    Mobility  Bed Mobility Overal bed mobility: Needs Assistance Bed Mobility: Supine to Sit;Sit to Supine     Supine to sit: Mod assist;Max assist Sit to supine: Max assist   General bed mobility comments: OOB in recliner  Transfers Overall transfer level: Needs assistance Equipment used: Rolling walker (2 wheeled) Transfers: Sit to/from Stand Sit to Stand: Min assist;Mod assist Stand pivot transfers: Min assist       General transfer comment: increased time and 25% VC's on safety with turns  Ambulation/Gait Ambulation/Gait assistance: Min assist Ambulation Distance (Feet): 28 Feet Assistive device: Rolling walker (2 wheeled) Gait Pattern/deviations: Decreased step length - right;Decreased step length - left;Shuffle;Trunk flexed;Step-to pattern;Step-through pattern Gait velocity: decreased x 2   General Gait Details: very slow gait with max c/o fatigue.  Limited distance.  Unsteady shuffled steps.  Pt stated, "I'm flat footed"   Stairs            Wheelchair Mobility    Modified Rankin (Stroke Patients Only)       Balance                                    Cognition Arousal/Alertness: Awake/alert Behavior During Therapy: WFL for tasks assessed/performed Overall  Cognitive Status: Within Functional Limits for tasks assessed                      Exercises      General Comments        Pertinent Vitals/Pain Pain Assessment: 0-10 Pain Score: 6  Pain Location: R hip Pain Descriptors / Indicators: Sore;Tender;Tightness Pain Intervention(s): Monitored during session;Repositioned;Ice applied;Patient requesting pain meds-RN notified    Home Living                      Prior Function            PT Goals (current goals can now be found in the care plan section) Progress towards PT goals: Progressing toward goals    Frequency    7X/week      PT Plan Current plan remains appropriate    Co-evaluation             End of Session Equipment Utilized During Treatment: Gait belt Activity Tolerance: Patient limited by fatigue;Patient limited by pain Patient left: in chair;with call bell/phone within reach     Time: 1516-1535 PT Time Calculation (min) (ACUTE ONLY): 19 min  Charges:  $Gait Training: 8-22 mins                     G Codes:      Rica Koyanagi  PTA [employer]  Acute  Rehab Pager      [phone]

## 2016-04-26 NOTE — Progress Notes (Signed)
Patient had a cbg of 669 at HS. Had 15 gram of CHO. Recheck showed a cbg of 36. Patient was not exhibiting any symptoms of low blood sugar. Blood sugar was rechecked a few minutes later and it was 115

## 2016-04-26 NOTE — Progress Notes (Signed)
Recheck by same meter cbg = 32; pt still asymptomatic. Obtained other meter and rechecked: cbg = 98. Pt's fingers are cold & may be affecting the results. Bri Wakeman, [employer]

## 2016-04-26 NOTE — Clinical Social Work Placement (Signed)
   CLINICAL SOCIAL WORK PLACEMENT  NOTE  Date:  04/26/2016  Patient Details  Name: Laura Neal MRN: [id number] Date of Birth: 1950-09-24  Clinical Social Work is seeking post-discharge placement for this patient at the [place] level of care (*CSW will initial, date and re-position this form in  chart as items are completed):  Yes   Patient/family provided with [place] Work Department's list of facilities offering this level of care within the geographic area requested by the patient (or if unable, by the patient's family).  Yes   Patient/family informed of their freedom to choose among providers that offer the needed level of care, that participate in Medicare, Medicaid or managed care program needed by the patient, have an available bed and are willing to accept the patient.  Yes   Patient/family informed of [facility]'s ownership interest in [facility] and [facility], as well as of the fact that they are under no obligation to receive care at these facilities.  PASRR submitted to EDS on 04/26/16     PASRR number received on 04/26/16     Existing PASRR number confirmed on       FL2 transmitted to all facilities in geographic area requested by pt/family on 04/26/16     FL2 transmitted to all facilities within larger geographic area on       Patient informed that his/her managed care company has contracts with or will negotiate with certain facilities, including the following:        Yes   Patient/family informed of bed offers received.  Patient chooses bed at [facility]     Physician recommends and patient chooses bed at      Patient to be transferred to   on  .  Patient to be transferred to facility by       Patient family notified on   of transfer.  Name of family member notified:        PHYSICIAN       Additional Comment:    _______________________________________________ Darden Dates, LCSW 04/26/2016, 3:00  PM

## 2016-04-26 NOTE — Progress Notes (Signed)
OT Cancellation Note  Patient Details Name: Laura Neal MRN: [id number] DOB: Sep 24, 1950   Cancelled Treatment:    Reason Eval/Treat Not Completed: Fatigue/lethargy limiting ability to participate  Pt just had gotten back to bed.  Will check on pt next day.  Kari Baars, [facility]  Payton Mccallum D 04/26/2016, 10:58 AM

## 2016-04-26 NOTE — Progress Notes (Signed)
Contacted CSW with referral to SNF rehab. Jonnie Finner RN CCM Case Mgmt phone [phone]

## 2016-04-26 NOTE — Clinical Social Work Note (Signed)
Clinical Social Work Assessment  Patient Details  Name: Laura Neal MRN: [id number] Date of Birth: 06-Jan-1951  Date of referral:  04/26/16               Reason for consult:  Facility Placement, Discharge Planning                Permission sought to share information with:    Permission granted to share information::     Name::        Agency::     Relationship::     Contact Information:     Housing/Transportation Living arrangements for the past 2 months:  [place] Information:  Patient Patient Interpreter Needed:  None Criminal Activity/Legal Involvement Pertinent to Current Situation/Hospitalization:  No - Comment as needed Significant Relationships:  Siblings Lives with:  Self Do you feel safe going back to the place where you live?  Yes Need for family participation in patient care:  No (Coment)  Care giving concerns:  No care giving concerns identified.   Social Worker assessment / plan:  CSW met with pt to address consult for New SNF. At time of consult, pt was ready for discharge today and has requested SNF. CSW introduced herself and explained role of social work. CSW also explained the process of discharging to SNF. SNF search was initiated, and CSW provided bed offers. Pt chose [facility]. Facility was able to accept her, however per PA, pt will stay one more night for testing. CSW updated facility. Pt is in agreement. CSW will continue to follow.   Employment status:  Retired Nurse, adult PT Recommendations:  Home with [place] / Referral to community resources:  [place]  Patient/Family's Response to care:  Pt was appreciative of CSW support.   Patient/Family's Understanding of and Emotional Response to Diagnosis, Current Treatment, and Prognosis:  Pt understands that she would benefit from STR, however it will likely be a short period of time.   Emotional Assessment Appearance:   Appears stated age Attitude/Demeanor/Rapport:   (Appropriate) Affect (typically observed):  Accepting, Adaptable Orientation:  Oriented to Self Alcohol / Substance use:  Not Applicable Psych involvement (Current and /or in the community):  No (Comment)  Discharge Needs  Concerns to be addressed:  Other (Comment Required Readmission within the last 30 days:  No Current discharge risk:  None Barriers to Discharge:  No Barriers Identified   Darden Dates, LCSW 04/26/2016, 3:10 PM

## 2016-04-26 NOTE — NC FL2 (Signed)
[place] LEVEL OF CARE SCREENING TOOL     IDENTIFICATION  Patient Name: Laura Neal Birthdate: [birth year] Sex: female Admission Date (Current Location): 04/22/2016  [facility] and [place] Number:  Herbalist and Address:  [facility] [place], [place] [address] [place], [place] [address]      Provider Number: [id number]  Attending Physician Name and Address:  Gaynelle Arabian, MD  Relative Name and Phone Number:       Current Level of Care: Hospital Recommended Level of Care: [place] Prior Approval Number:    Date Approved/Denied:   PASRR Number: [id number]  Discharge Plan: Hospital    Current Diagnoses: Patient Active Problem List   Diagnosis Date Noted  . OA (osteoarthritis) of hip 04/22/2016  . Diabetes mellitus type 2, controlled ([place]) 12/09/2014    Orientation RESPIRATION BLADDER Height & Weight     Self, Time, Situation, Place  Normal Continent Weight: 216 lb (98 kg) Height:  5\' 8"  (172.7 cm)  BEHAVIORAL SYMPTOMS/MOOD NEUROLOGICAL BOWEL NUTRITION STATUS      Continent Diet (Carb Modified, Thin Liquids)  AMBULATORY STATUS COMMUNICATION OF NEEDS Skin   Limited Assist Verbally Surgical wounds                       Personal Care Assistance Level of Assistance  Bathing, Dressing, Feeding Bathing Assistance: Limited assistance Feeding assistance: Independent Dressing Assistance: Limited assistance     Functional Limitations Info  Sight, Hearing, Speech Sight Info: Adequate Hearing Info: Adequate Speech Info: Adequate    SPECIAL CARE FACTORS FREQUENCY  PT (By licensed PT), OT (By licensed OT)     PT Frequency: 5 OT Frequency: 5            Contractures Contractures Info: Not present    Additional Factors Info  Code Status, Allergies, Insulin Sliding Scale Code Status Info: Full Code Allergies Info: Codeine   Insulin Sliding Scale Info: 4x/day       Current Medications (04/26/2016):   This is the current hospital active medication list Current Facility-Administered Medications  Medication Dose Route Frequency Provider Last Rate Last Dose  . 0.9 %  sodium chloride infusion   Intravenous Continuous Alexzandrew L Perkins, PA-C   Stopped at 04/23/16 2323  . 0.9 %  sodium chloride infusion   Intravenous Once Jaclyn M Bissell, PA-C      . acetaminophen (TYLENOL) tablet 650 mg  650 mg Oral Q6H PRN Gaynelle Arabian, MD   650 mg at 04/24/16 1420   Or  . acetaminophen (TYLENOL) suppository 650 mg  650 mg Rectal Q6H PRN Gaynelle Arabian, MD      . bisacodyl (DULCOLAX) suppository 10 mg  10 mg Rectal Daily PRN Gaynelle Arabian, MD      . diphenhydrAMINE (BENADRYL) 12.5 MG/5ML elixir 12.5-25 mg  12.5-25 mg Oral Q4H PRN Gaynelle Arabian, MD      . docusate sodium (COLACE) capsule 100 mg  100 mg Oral BID Gaynelle Arabian, MD   100 mg at 04/26/16 1051  . exenatide (BYETTA) injection SOPN 10 mcg  10 mcg Subcutaneous BID Gaynelle Arabian, MD   10 mcg at 04/24/16 1700  . fluticasone (FLONASE) 50 MCG/ACT nasal spray 2 spray  2 spray Each Nare Daily PRN Gaynelle Arabian, MD      . furosemide (LASIX) tablet 20 mg  20 mg Oral Daily PRN Gaynelle Arabian, MD      . losartan (COZAAR) tablet 100 mg  100 mg Oral Daily Gaynelle Arabian, MD   100 mg at 04/25/16 1016   And  . hydrochlorothiazide (HYDRODIURIL) tablet 25 mg  25 mg Oral Daily Gaynelle Arabian, MD   25 mg at 04/25/16 1016  . insulin aspart (novoLOG) injection 0-15 Units  0-15 Units Subcutaneous TID WC Gaynelle Arabian, MD   2 Units at 04/23/16 1558  . insulin glargine (LANTUS) injection 30 Units  30 Units Subcutaneous QHS Gaynelle Arabian, MD   30 Units at 04/24/16 2156  . menthol-cetylpyridinium (CEPACOL) lozenge 3 mg  1 lozenge Oral PRN Gaynelle Arabian, MD       Or  . phenol (CHLORASEPTIC) mouth spray 1 spray  1 spray Mouth/Throat PRN Gaynelle Arabian, MD      . methocarbamol (ROBAXIN) tablet 500 mg  500 mg Oral Q6H PRN Gaynelle Arabian, MD   500 mg at 04/26/16 1118   Or  .  methocarbamol (ROBAXIN) 500 mg in dextrose 5 % 50 mL IVPB  500 mg Intravenous Q6H PRN Gaynelle Arabian, MD      . metoCLOPramide (REGLAN) tablet 5-10 mg  5-10 mg Oral Q8H PRN Gaynelle Arabian, MD       Or  . metoCLOPramide (REGLAN) injection 5-10 mg  5-10 mg Intravenous Q8H PRN Gaynelle Arabian, MD      . morphine 2 MG/ML injection 1 mg  1 mg Intravenous Q2H PRN Gaynelle Arabian, MD   1 mg at 04/23/16 0427  . ondansetron (ZOFRAN) tablet 4 mg  4 mg Oral Q6H PRN Gaynelle Arabian, MD       Or  . ondansetron [facility]) injection 4 mg  4 mg Intravenous Q6H PRN Gaynelle Arabian, MD      . oxyCODONE (Oxy IR/ROXICODONE) immediate release tablet 5-10 mg  5-10 mg Oral Q3H PRN Gaynelle Arabian, MD   10 mg at 04/26/16 1118  . pazopanib (VOTRIENT) tablet 600 mg  600 mg Oral Daily Gaynelle Arabian, MD   600 mg at 04/26/16 1154  . polyethylene glycol (MIRALAX / GLYCOLAX) packet 17 g  17 g Oral Daily PRN Gaynelle Arabian, MD   17 g at 04/24/16 2206  . prochlorperazine (COMPAZINE) tablet 10 mg  10 mg Oral Q6H PRN Gaynelle Arabian, MD      . rivaroxaban Alveda Reasons) tablet 10 mg  10 mg Oral Q breakfast Gaynelle Arabian, MD   10 mg at 04/26/16 0813  . rosuvastatin (CRESTOR) tablet 10 mg  10 mg Oral QHS Gaynelle Arabian, MD   10 mg at 04/25/16 2218  . sodium phosphate (FLEET) 7-19 GM/118ML enema 1 enema  1 enema Rectal Once PRN Gaynelle Arabian, MD      . traMADol Veatrice Bourbon) tablet 50-100 mg  50-100 mg Oral Q6H PRN Gaynelle Arabian, MD         Discharge Medications: Please see discharge summary for a list of discharge medications.  Relevant Imaging Results:  Relevant Lab Results:   Additional Information SSN:  [id number]  Darden Dates, LCSW

## 2016-04-26 NOTE — Progress Notes (Signed)
cbg = 57, asymptomatic. Given regular coke along with dinner tray. Roc Streett, [employer]

## 2016-04-26 NOTE — Progress Notes (Signed)
Reviewed test results re: earlier note this am reporting a cbg of 669 last evening. Actual result was 69. Laura Neal, [employer]

## 2016-04-26 NOTE — Progress Notes (Addendum)
Physical Therapy Treatment Patient Details Name: Laura Neal MRN: [id number] DOB: [birth year] Today's Date: 04/26/2016    History of Present Illness s/p R DA THA with post op anemia    PT Comments    POD # 4  Assisted OOB to amb a limited distance due to pain and fatigue.  Assisted to bathroom then back to bed.  Performed some THR TE's then applied ICE.   Follow Up Recommendations  SNF (consulted with LPT and RN   pt not progressing as well as expected.  New rec are for SNF.  Discussed with pt and she agrees)     Equipment Recommendations       Recommendations for Other Services       Precautions / Restrictions Precautions Precautions: Fall Restrictions Weight Bearing Restrictions: No Other Position/Activity Restrictions: WBAT    Mobility  Bed Mobility Overal bed mobility: Needs Assistance Bed Mobility: Supine to Sit;Sit to Supine     Supine to sit: Mod assist;Max assist Sit to supine: Max assist   General bed mobility comments: required increased assist OOB and back into bed this session.  Pt unable to lift/slide  R LE on her own and requires assist.    Transfers Overall transfer level: Needs assistance Equipment used: Rolling walker (2 wheeled) Transfers: Sit to/from Stand Sit to Stand: Min assist;Mod assist Stand pivot transfers: Min assist       General transfer comment: required increased time and increased assist off lower level commode.  Required 25% VC's on safety with turns and backward gait completion.   Ambulation/Gait Ambulation/Gait assistance: Min assist Ambulation Distance (Feet): 21 Feet Assistive device: Rolling walker (2 wheeled) Gait Pattern/deviations: Decreased step length - right;Decreased step length - left;Shuffle;Trunk flexed;Step-to pattern;Step-through pattern Gait velocity: decreased x 3   General Gait Details: very slow gait with poor forwartd flaex posture and difficulty advancing R LE due to pain and fatigue level.  No c/o  dizziness.  BP held strong at 139/59 during gait.  Unsteady gait with slow mvts and slow coorective reaction responce.  HIGH FALL RISK.    Stairs            Wheelchair Mobility    Modified Rankin (Stroke Patients Only)       Balance                                    Cognition Arousal/Alertness: Awake/alert Behavior During Therapy: WFL for tasks assessed/performed Overall Cognitive Status: Within Functional Limits for tasks assessed                      Exercises  10 B LE AP  10 B LE knee presses  10 gluteal squeezes    General Comments        Pertinent Vitals/Pain Pain Assessment: 0-10 Pain Score: 6  Pain Location: R hip Pain Descriptors / Indicators: Sore;Tender;Tightness Pain Intervention(s): Monitored during session;Repositioned;Ice applied    Home Living                      Prior Function            PT Goals (current goals can now be found in the care plan section) Progress towards PT goals: Progressing toward goals (slowly)    Frequency    7X/week      PT Plan Current plan remains appropriate    Co-evaluation  End of Session Equipment Utilized During Treatment: Gait belt Activity Tolerance: Patient limited by fatigue;Patient limited by pain Patient left: in bed;with bed alarm set;with call bell/phone within reach     Time: 1002-1034 PT Time Calculation (min) (ACUTE ONLY): 32 min  Charges:  $Gait Training: 8-22 mins $Therapeutic Activity: 8-22 mins                    G Codes:      Rica Koyanagi  PTA [facility]  Acute  Rehab Pager      [phone]

## 2016-04-27 LAB — TYPE AND SCREEN
BLOOD PRODUCT EXPIRATION DATE: 201802082359
ISSUE DATE / TIME: 201801270957
UNIT TYPE AND RH: 5100

## 2016-04-27 LAB — GLUCOSE, CAPILLARY
GLUCOSE-CAPILLARY: 96 mg/dL (ref 65–99)
GLUCOSE-CAPILLARY: 101 mg/dL — AB (ref 65–99)
GLUCOSE-CAPILLARY: 177 mg/dL — AB (ref 65–99)

## 2016-04-27 MED ORDER — TRAMADOL HCL 50 MG PO TABS: 50.0000 mg | ORAL_TABLET | Freq: Four times a day (QID) | ORAL | 0 refills | Status: DC | PRN

## 2016-04-27 MED ORDER — RIVAROXABAN 10 MG PO TABS: 10.0000 mg | ORAL_TABLET | Freq: Every day | ORAL | 0 refills | Status: DC

## 2016-04-27 MED ORDER — OXYCODONE HCL 5 MG PO TABS: 5.0000 mg | ORAL_TABLET | ORAL | 0 refills | Status: DC | PRN

## 2016-04-27 MED ORDER — ACETAMINOPHEN 325 MG PO TABS: 650.0000 mg | ORAL_TABLET | Freq: Four times a day (QID) | ORAL | 0 refills | Status: DC | PRN

## 2016-04-27 MED ORDER — BISACODYL 10 MG RE SUPP: 10.0000 mg | Freq: Every day | RECTAL | 0 refills | Status: DC | PRN

## 2016-04-27 MED ORDER — METHOCARBAMOL 500 MG PO TABS: 500.0000 mg | ORAL_TABLET | Freq: Four times a day (QID) | ORAL | 0 refills | Status: DC | PRN

## 2016-04-27 MED ORDER — DOCUSATE SODIUM 100 MG PO CAPS: 100.0000 mg | ORAL_CAPSULE | Freq: Two times a day (BID) | ORAL | 0 refills | Status: DC | PRN

## 2016-04-27 MED ORDER — FLEET ENEMA 7-19 GM/118ML RE ENEM: 1.0000 | ENEMA | Freq: Once | RECTAL | 0 refills | Status: DC | PRN

## 2016-04-27 MED ORDER — POLYETHYLENE GLYCOL 3350 17 G PO PACK: 17.0000 g | PACK | Freq: Every day | ORAL | 0 refills | Status: DC | PRN

## 2016-04-27 NOTE — Progress Notes (Signed)
CSW met with pt to assist with d/c planning. Pt reports that MD does not want her to have rehab at [facility]. Pt is requesting to return home with [employer] services. MD is aware and in agreement with plan. RNCM will assist with d/c planning.  CSW signing off  Werner Lean LCSW [phone]

## 2016-04-27 NOTE — Progress Notes (Signed)
Spoke with pt concerning [place] discharging with [facility]. DME from [facility] in room.

## 2016-04-27 NOTE — Care Management Note (Signed)
Case Management Note  Patient Details  Name: Laura Neal MRN: [id number] Date of Birth: Jul 03, 1950  Subjective/Objective:  Patient going home w/HHC-[facility] rep Octavia Bruckner already following, aware of d/c. [facility] dme rep aware of d/c & dme rw,3n1 order to deliver to rm prior d/c. No further CM needs.                  Action/Plan:d/c home w/HHC/DME   Expected Discharge Date:  04/24/16               Expected Discharge Plan:  [place]  In-House Referral:     Discharge planning Services  CM Consult  Post Acute Care Choice:  Home Health Choice offered to:  Patient  DME Arranged:  3-N-1, Walker rolling DME Agency:  [place]:  PT [place]:  [facility]  Status of Service:  Completed, signed off  If discussed at [employer], dates discussed:    Additional Comments:  Dessa Phi, RN 04/27/2016, 11:55 AM

## 2016-04-27 NOTE — Progress Notes (Signed)
   Subjective: 5 Days Post-Op Procedure(s) (LRB): RIGHT TOTAL HIP ARTHROPLASTY ANTERIOR APPROACH (Right) Patient reports pain as mild.   Patient seen in rounds by Dr. Wynelle Link. Patient is well, but has had some minor complaints of pain in the hip, requiring pain medications Patient is ready to go to a SNF  Objective: Vital signs in last 24 hours: Temp:  [98 F (36.7 C)-99.1 F (37.3 C)] 99.1 F (37.3 C) (01/29 0644) Pulse Rate:  [82-92] 85 (01/29 0644) Resp:  [17] 17 (01/29 0644) BP: (118-133)/(58-59) 118/59 (01/29 0644) SpO2:  [96 %-100 %] 100 % (01/29 0644)  Intake/Output from previous day:  Intake/Output Summary (Last 24 hours) at 04/27/16 0739 Last data filed at 04/27/16 0600  Gross per 24 hour  Intake             1160 ml  Output              450 ml  Net              710 ml    Intake/Output this shift: No intake/output data recorded.  Labs:  Recent Labs  04/25/16 0442 04/25/16 1701 04/26/16 0915  HGB 7.8* 9.4* 9.1*    Recent Labs  04/25/16 0442 04/25/16 1701 04/26/16 0915  WBC 10.9*  --   --   RBC 2.26*  --   --   HCT 23.8* 28.5* 28.8*  PLT 226  --   --    No results for input(s): NA, K, CL, CO2, BUN, CREATININE, GLUCOSE, CALCIUM in the last 72 hours. No results for input(s): LABPT, INR in the last 72 hours.  EXAM: General - Patient is Alert and Appropriate Extremity - Neurovascular intact Sensation intact distally Incision - clean, dry, no drainage Motor Function - intact, moving foot and toes well on exam.   Assessment/Plan: 5 Days Post-Op Procedure(s) (LRB): RIGHT TOTAL HIP ARTHROPLASTY ANTERIOR APPROACH (Right) Procedure(s) (LRB): RIGHT TOTAL HIP ARTHROPLASTY ANTERIOR APPROACH (Right) Past Medical History:  Diagnosis Date  . Allergy   . Arthritis   . Blood transfusion   . Cancer ([place]) 2000   Kidney  . Diabetes mellitus    type II   . GERD (gastroesophageal reflux disease)   . Hyperlipidemia   . Hypertension   . Pneumonia    hx of    . Ulcer ([place])    Principal Problem:   OA (osteoarthritis) of hip  Estimated body mass index is 32.84 kg/m as calculated from the following:   Height as of this encounter: 5\' 8"  (1.727 m).   Weight as of this encounter: 98 kg (216 lb). Up with therapy Discharge to SNF Diet - Cardiac diet and Diabetic diet Follow up - in 2 weeks Activity - WBAT Disposition - Skilled nursing facility Condition Upon Discharge - Stable D/C Meds - See DC Summary DVT Prophylaxis - Xarelto  Arlee Muslim, PA-C Orthopaedic Surgery 04/27/2016, 7:39 AM

## 2016-04-27 NOTE — Progress Notes (Signed)
Occupational Therapy Treatment Patient Details Name: Laura Neal MRN: [id number] DOB: May 06, 1950 Today's Date: 04/27/2016    History of present illness s/p R DA THA with post op anemia   OT comments  Pt making progress- wants to go home if she can  Follow Up Recommendations  Home health OT;SNF;Supervision/Assistance - 24 hour    Equipment Recommendations  3 in 1 bedside commode       Precautions / Restrictions Precautions Precautions: Fall       Mobility Bed Mobility Overal bed mobility: Needs Assistance Bed Mobility: Supine to Sit Rolling: Min assist   Supine to sit: Min assist Sit to supine: Min assist      Transfers Overall transfer level: Needs assistance Equipment used: Rolling walker (2 wheeled) Transfers: Sit to/from [employer] Sit to Stand: Min assist Stand pivot transfers: Min assist                ADL Overall ADL's : Needs assistance/impaired                     Lower Body Dressing: Moderate assistance;Sit to/from stand;Cueing for safety;Cueing for sequencing   Toilet Transfer: RW;Ambulation;Cueing for sequencing;Cueing for safety;Min guard   Toileting- Clothing Manipulation and Hygiene: Min guard;Sit to/from stand;Cueing for sequencing;Cueing for safety;Cueing for compensatory techniques                          Cognition   Behavior During Therapy: [facility] for tasks assessed/performed Overall Cognitive Status: Within Functional Limits for tasks assessed                               General Comments      Pertinent Vitals/ Pain       Pain Assessment: 0-10 Pain Score: 3  Pain Location: r hip Pain Descriptors / Indicators: Sore Pain Intervention(s): Monitored during session  Home Living                                              Frequency  Min 2X/week        Progress Toward Goals  OT Goals(current goals can now be found in the care plan section)  Progress  towards OT goals: Progressing toward goals     Plan Discharge plan needs to be updated    Co-evaluation                 End of Session Equipment Utilized During Treatment: Rolling walker   Activity Tolerance Patient tolerated treatment well   Patient Left in chair;with call bell/phone within reach   Nurse Communication Mobility status        Time: 0935-1005 OT Time Calculation (min): 30 min  Charges: OT General Charges $OT Visit: 1 Procedure OT Treatments $Self Care/Home Management : 23-37 mins  Ayo Guarino, Thereasa Parkin 04/27/2016, 10:19 AM

## 2016-04-27 NOTE — Progress Notes (Signed)
Physical Therapy Treatment Patient Details Name: Laura Neal MRN: [id number] DOB: Jun 17, 1950 Today's Date: 04/27/2016    History of Present Illness s/p R DA THA with post op anemia    PT Comments    POD # 5 pm session with family present for "hands on"  Instruction esp to assist pt up stairs to enter home.  Instructed on HEP and given handout.  Instructed on use of ICE.    Follow Up Recommendations  Home health PT (per chart review, pt now plans to D/C to home)     Equipment Recommendations  Rolling walker with 5" wheels    Recommendations for Other Services       Precautions / Restrictions Precautions Precautions: Fall Restrictions Weight Bearing Restrictions: No Other Position/Activity Restrictions: WBAT    Mobility  Bed Mobility               General bed mobility comments: OOB in recliner  Transfers   Equipment used: Rolling walker (2 wheeled) Transfers: Sit to/from [facility] Sit to Stand: Supervision;Min guard Stand pivot transfers: Supervision;Min guard       General transfer comment: increased time and 25% VC's on safety with turns  Ambulation/Gait Ambulation/Gait assistance: Supervision;Min guard Ambulation Distance (Feet): 58 Feet Assistive device: Rolling walker (2 wheeled) Gait Pattern/deviations: Step-to pattern;Step-through pattern Gait velocity: decreased    General Gait Details: tolerated increased distance   Stairs Stairs: Yes   Stair Management: One rail Right;Backwards;With crutches Number of Stairs: 2 General stair comments: one crutch and one rail with 50% VC's on proper sequencing and tech with family present for "hands on" instruction.   Wheelchair Mobility    Modified Rankin (Stroke Patients Only)       Balance                                    Cognition Arousal/Alertness: Awake/alert Behavior During Therapy: WFL for tasks assessed/performed Overall Cognitive Status: Within  Functional Limits for tasks assessed                      Exercises   Total Hip Replacement TE's 10 reps ankle pumps 10 reps knee presses 10 reps heel slides 10 reps SAQ's 10 reps ABD Followed by ICE     General Comments        Pertinent Vitals/Pain Pain Assessment: 0-10 Pain Score: 3  Pain Location: r hip Pain Descriptors / Indicators: Sore;Tender;Tightness Pain Intervention(s): Monitored during session;Repositioned;Ice applied    Home Living                      Prior Function            PT Goals (current goals can now be found in the care plan section) Progress towards PT goals: Progressing toward goals    Frequency    7X/week      PT Plan Current plan remains appropriate    Co-evaluation             End of Session Equipment Utilized During Treatment: Gait belt Activity Tolerance: Patient tolerated treatment well Patient left: in chair;with call bell/phone within reach     Time: 1355-1420 PT Time Calculation (min) (ACUTE ONLY): 25 min  Charges:  $Gait Training: 8-22 mins $Therapeutic Exercise: 8-22 mins  G Codes:      Laura Neal  PTA [facility]  Acute  Rehab Pager      [phone]

## 2016-04-27 NOTE — Progress Notes (Signed)
Physical Therapy Treatment Patient Details Name: Laura Neal MRN: [id number] DOB: June 12, 1950 Today's Date: 04/27/2016    History of Present Illness s/p R DA THA with post op anemia    PT Comments    POD # 5 am session Assisted with amb a greater distance then practiced stairs.  Pt will need another PT session with family to address stairs and HEP.  Follow Up Recommendations  Home health PT (per chart review, pt now plans to D/C to home)     Equipment Recommendations  Rolling walker with 5" wheels    Recommendations for Other Services       Precautions / Restrictions Precautions Precautions: Fall Restrictions Weight Bearing Restrictions: No Other Position/Activity Restrictions: WBAT    Mobility  Bed Mobility               General bed mobility comments: OOB in recliner  Transfers   Equipment used: Rolling walker (2 wheeled) Transfers: Sit to/from [facility] Sit to Stand: Supervision;Min guard Stand pivot transfers: Supervision;Min guard       General transfer comment: increased time and 25% VC's on safety with turns  Ambulation/Gait Ambulation/Gait assistance: Supervision;Min guard Ambulation Distance (Feet): 58 Feet Assistive device: Rolling walker (2 wheeled) Gait Pattern/deviations: Step-to pattern;Step-through pattern Gait velocity: decreased    General Gait Details: tolerated increased distance   Stairs Stairs: Yes   Stair Management: One rail Right;Backwards;With crutches Number of Stairs: 2 General stair comments: one crutch and one rail with 50% VC's on proper sequencing and tech  Wheelchair Mobility    Modified Rankin (Stroke Patients Only)       Balance                                    Cognition Arousal/Alertness: Awake/alert Behavior During Therapy: WFL for tasks assessed/performed Overall Cognitive Status: Within Functional Limits for tasks assessed                       Exercises      General Comments        Pertinent Vitals/Pain Pain Assessment: 0-10 Pain Score: 3  Pain Location: r hip Pain Descriptors / Indicators: Sore;Tender;Tightness Pain Intervention(s): Monitored during session;Repositioned;Ice applied    Home Living                      Prior Function            PT Goals (current goals can now be found in the care plan section) Progress towards PT goals: Progressing toward goals    Frequency    7X/week      PT Plan Current plan remains appropriate    Co-evaluation             End of Session Equipment Utilized During Treatment: Gait belt Activity Tolerance: Patient tolerated treatment well Patient left: in chair;with call bell/phone within reach     Time: [id number] PT Time Calculation (min) (ACUTE ONLY): 31 min  Charges:  $Gait Training: 8-22 mins $Therapeutic Activity: 8-22 mins                    G Codes:      Rica Koyanagi  PTA [facility]  Acute  Rehab Pager      [phone]

## 2016-05-04 ENCOUNTER — Other Ambulatory Visit: Payer: Self-pay | Admitting: *Deleted

## 2016-05-04 DIAGNOSIS — C649 Malignant neoplasm of unspecified kidney, except renal pelvis: Secondary | ICD-10-CM

## 2016-05-04 MED ORDER — PAZOPANIB HCL 200 MG PO TABS: 600.0000 mg | ORAL_TABLET | Freq: Every day | ORAL | 0 refills | Status: DC

## 2016-05-04 MED FILL — VOTRIENT 200 MG TABLET: 200 | 30 days supply | Qty: 90 | Fill #0

## 2016-05-20 ENCOUNTER — Telehealth: Payer: Self-pay

## 2016-05-20 ENCOUNTER — Other Ambulatory Visit (HOSPITAL_BASED_OUTPATIENT_CLINIC_OR_DEPARTMENT_OTHER): Payer: Medicare Other

## 2016-05-20 ENCOUNTER — Telehealth: Payer: Self-pay | Admitting: Oncology

## 2016-05-20 ENCOUNTER — Ambulatory Visit (HOSPITAL_BASED_OUTPATIENT_CLINIC_OR_DEPARTMENT_OTHER): Payer: Medicare Other | Admitting: Oncology

## 2016-05-20 VITALS — BP 148/77 | HR 122 | Temp 97.6°F | Resp 18 | Ht 68.0 in | Wt 205.5 lb

## 2016-05-20 DIAGNOSIS — L271 Localized skin eruption due to drugs and medicaments taken internally: Secondary | ICD-10-CM | POA: Diagnosis not present

## 2016-05-20 DIAGNOSIS — C649 Malignant neoplasm of unspecified kidney, except renal pelvis: Secondary | ICD-10-CM

## 2016-05-20 DIAGNOSIS — C7970 Secondary malignant neoplasm of unspecified adrenal gland: Secondary | ICD-10-CM

## 2016-05-20 DIAGNOSIS — C642 Malignant neoplasm of left kidney, except renal pelvis: Secondary | ICD-10-CM | POA: Diagnosis not present

## 2016-05-20 DIAGNOSIS — R634 Abnormal weight loss: Secondary | ICD-10-CM

## 2016-05-20 DIAGNOSIS — R630 Anorexia: Secondary | ICD-10-CM | POA: Diagnosis not present

## 2016-05-20 DIAGNOSIS — N289 Disorder of kidney and ureter, unspecified: Secondary | ICD-10-CM | POA: Diagnosis not present

## 2016-05-20 DIAGNOSIS — C7800 Secondary malignant neoplasm of unspecified lung: Secondary | ICD-10-CM

## 2016-05-20 LAB — CBC WITH DIFFERENTIAL/PLATELET
BASO%: 0.1 % (ref 0.0–2.0)
Basophils Absolute: 0 10*3/uL (ref 0.0–0.1)
EOS%: 0.4 % (ref 0.0–7.0)
Eosinophils Absolute: 0 10*3/uL (ref 0.0–0.5)
HCT: 34.6 % — ABNORMAL LOW (ref 34.8–46.6)
HEMOGLOBIN: 10.7 g/dL — AB (ref 11.6–15.9)
LYMPH%: 26.9 % (ref 14.0–49.7)
MCH: 30.7 pg (ref 25.1–34.0)
MCHC: 30.9 g/dL — ABNORMAL LOW (ref 31.5–36.0)
MCV: 99.1 fL (ref 79.5–101.0)
MONO#: 0.6 10*3/uL (ref 0.1–0.9)
MONO%: 6.5 % (ref 0.0–14.0)
NEUT%: 66.1 % (ref 38.4–76.8)
NEUTROS ABS: 6.2 10*3/uL (ref 1.5–6.5)
Platelets: 362 10*3/uL (ref 145–400)
RBC: 3.49 10*6/uL — AB (ref 3.70–5.45)
RDW: 17.4 % — AB (ref 11.2–14.5)
WBC: 9.3 10*3/uL (ref 3.9–10.3)
lymph#: 2.5 10*3/uL (ref 0.9–3.3)

## 2016-05-20 LAB — COMPREHENSIVE METABOLIC PANEL
ALBUMIN: 3 g/dL — AB (ref 3.5–5.0)
ALK PHOS: 113 U/L (ref 40–150)
ALT: 8 U/L (ref 0–55)
AST: 12 U/L (ref 5–34)
Anion Gap: 13 mEq/L — ABNORMAL HIGH (ref 3–11)
BILIRUBIN TOTAL: 0.76 mg/dL (ref 0.20–1.20)
BUN: 17.4 mg/dL (ref 7.0–26.0)
CO2: 22 meq/L (ref 22–29)
Calcium: 9.5 mg/dL (ref 8.4–10.4)
Chloride: 104 mEq/L (ref 98–109)
Creatinine: 1.6 mg/dL — ABNORMAL HIGH (ref 0.6–1.1)
EGFR: 39 mL/min/{1.73_m2} — ABNORMAL LOW (ref >90)
GLUCOSE: 162 mg/dL — AB (ref 70–140)
POTASSIUM: 4.4 meq/L (ref 3.5–5.1)
SODIUM: 139 meq/L (ref 136–145)
TOTAL PROTEIN: 7.2 g/dL (ref 6.4–8.3)

## 2016-05-20 MED ORDER — MEGESTROL ACETATE 400 MG/10ML PO SUSP: 400.0000 mg | Freq: Every day | ORAL | 0 refills | Status: DC

## 2016-05-20 NOTE — Telephone Encounter (Signed)
Gave patient avs report and appointments for March. Central radiology will call re scan.  °

## 2016-05-20 NOTE — Telephone Encounter (Signed)
Caren Griffins pharmacist at [facility] called about megace rx. 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 S/w Dr Alen Blew and he said to go ahead with megace, he is aware thrombotic effect. Passed message to [place] at [facility].

## 2016-05-20 NOTE — Progress Notes (Signed)
Hematology and Oncology Follow Up Visit  Laura Neal [id number] Jul 24, 1950 66 y.o. 05/20/2016 12:29 PM [name], MD   Principle Diagnosis: 66 year old woman with renal cell carcinoma. She presented with a 9.5 x 7.9 x 8.5 cm mass in the left kidney in March 2017. PET CT scan showed metastatic disease to the lung as well as the adrenal glands. This is biopsy proven to be renal cell carcinoma.   Prior Therapy: She is status post biopsy of her kidney mass which confirmed the presence of renal cell carcinoma on 07/04/2015.  Current therapy: Votrient 800 mg daily started on 07/16/2015. The dose will be reduced to 600 mg starting on 12/31/2015.  Interim History: Laura Neal presents today for a follow-up visit. Since the last visit, she underwent replacement surgery which she tolerated reasonably well. Her hemoglobin did drop slightly and required packed results transfusion. Her hemoglobin upon discharge was 9.1. She continues to receive physical therapy and her mobility is improving slowly. She does have some pain associated with that operation.   She continues to tolerate Votrient at 600 mg daily much better. She reports mild diarrhea and grade 1 fatigue which is manageable. She continues to be ambulatory and attends to activities of daily living. Her appetite did declined further and lost about 10 pounds since the last visit. Her weight loss has been exacerbated by her recent operation.  She does not report any headaches, blurry vision, syncope or seizures. She does not report any fevers, chills, sweats or decline in her energy. She does not report any chest pain, palpitation, orthopnea or leg edema. She does not report any cough, wheezing or hemoptysis. She does not report any nausea, vomiting, abdominal pain, hematochezia or melena. She does not report any frequency, urgency or hesitancy. She does not report any arthralgias or myalgias. She does not report any lymphadenopathy or  petechiae. Remaining review of systems unremarkable.    Medications: I have reviewed the patient's current medications.  Current Outpatient Prescriptions  Medication Sig Dispense Refill  . acetaminophen (TYLENOL) 325 MG tablet Take 2 tablets (650 mg total) by mouth every 6 (six) hours as needed for mild pain (or Fever >/= 101). 10 tablet 0  . bisacodyl (DULCOLAX) 10 MG suppository Place 1 suppository (10 mg total) rectally daily as needed for moderate constipation. 12 suppository 0  . BYETTA 10 MCG PEN 10 MCG/0.04ML SOPN injection Inject 10 mg into the skin 2 (two) times daily.    [name] docusate sodium (COLACE) 100 MG capsule Take 1 capsule (100 mg total) by mouth 2 (two) times daily as needed for mild constipation. 10 capsule 0  . fluticasone (FLONASE) 50 MCG/ACT nasal spray Place 2 sprays into the nose daily. (Patient taking differently: Place 2 sprays into the nose daily as needed for allergies. ) 16 g 6  . furosemide (LASIX) 20 MG tablet Take 20 mg by mouth daily as needed for fluid or edema.     . insulin glargine (LANTUS) 100 UNIT/ML injection Inject 30 Units into the skin at bedtime.     [name] loperamide (IMODIUM) 2 MG capsule Take 2 mg by mouth as needed for diarrhea or loose stools (side effects of Votrient.).    [name] losartan-hydrochlorothiazide (HYZAAR) 100-25 MG tablet Take 1 tablet by mouth daily.    . methocarbamol (ROBAXIN) 500 MG tablet Take 1 tablet (500 mg total) by mouth every 6 (six) hours as needed for muscle spasms. 80 tablet 0  . oxyCODONE (OXY IR/ROXICODONE) 5 MG  immediate release tablet Take 1-2 tablets (5-10 mg total) by mouth every 4 (four) hours as needed for moderate pain or severe pain. 84 tablet 0  . pazopanib (VOTRIENT) 200 MG tablet Take 3 tablets (600 mg total) by mouth daily. Take on an empty stomach. 90 tablet 0  . polyethylene glycol (MIRALAX / GLYCOLAX) packet Take 17 g by mouth daily as needed for mild constipation. 14 each 0  . prochlorperazine (COMPAZINE) 10 MG tablet  Take 1 tablet (10 mg total) by mouth every 6 (six) hours as needed for nausea or vomiting. 30 tablet 1  . rivaroxaban (XARELTO) 10 MG TABS tablet Take 1 tablet (10 mg total) by mouth daily with breakfast. Take Xarelto for two and a half more weeks following discharge from the hospital, then discontinue Xarelto. Once the patient has completed the Xarelto, they may resume the 81 mg Aspirin. 17 tablet 0  . rosuvastatin (CRESTOR) 10 MG tablet Take 10 mg by mouth at bedtime.     . sodium phosphate (FLEET) 7-19 GM/118ML ENEM Place 133 mLs (1 enema total) rectally once as needed for severe constipation. 2 Bottle 0  . traMADol (ULTRAM) 50 MG tablet Take 1-2 tablets (50-100 mg total) by mouth every 6 (six) hours as needed (mild pain). 56 tablet 0  . megestrol (MEGACE) 400 MG/10ML suspension Take 10 mLs (400 mg total) by mouth daily. 240 mL 0   No current facility-administered medications for this visit.      Allergies:  Allergies  Allergen Reactions  . Codeine Rash    Past Medical History, Surgical history, Social history, and Family History were reviewed and updated.  [name] Physical Exam: Blood pressure (!) 148/77, pulse (!) 122, temperature 97.6 F (36.4 C), temperature source Oral, resp. rate 18, height 5\' 8"  (1.727 m), weight 205 lb 8 oz (93.2 kg), SpO2 98 %. ECOG: 0 General appearance: Alert, awake woman without distress. Head: Normocephalic, without obvious abnormality no oral ulcers or lesions. Neck: no adenopathy Lymph nodes: Cervical, supraclavicular, and axillary nodes normal. Heart:regular rate and rhythm, S1, S2 normal, no murmur, click, rub or gallop Lung:chest clear, no wheezing, rales, normal symmetric air entry Abdomin: soft, non-tender, without masses or organomegaly no rebound or guarding. EXT: Very little to no erythema noted on her palms.   Lab Results: Lab Results  Component Value Date   WBC 9.3 05/20/2016   HGB 10.7 (L) 05/20/2016   HCT 34.6 (L) 05/20/2016   MCV 99.1  05/20/2016   PLT 362 05/20/2016     Chemistry      Component Value Date/Time   NA 140 04/24/2016 0432   NA 143 04/09/2016 0918   K 4.3 04/24/2016 0432   K 3.5 04/09/2016 0918   CL 106 04/24/2016 0432   CO2 26 04/24/2016 0432   CO2 27 04/09/2016 0918   BUN 23 (H) 04/24/2016 0432   BUN 17.0 04/09/2016 0918   CREATININE 1.00 04/24/2016 0432   CREATININE 1.4 (H) 04/09/2016 0918      Component Value Date/Time   CALCIUM 9.0 04/24/2016 0432   CALCIUM 9.3 04/09/2016 0918   ALKPHOS 63 04/21/2016 1917   ALKPHOS 86 04/09/2016 0918   AST 19 04/21/2016 1917   AST 18 04/09/2016 0918   ALT 15 04/21/2016 1917   ALT 10 04/09/2016 0918   BILITOT 0.6 04/21/2016 1917   BILITOT 0.63 04/09/2016 0918     Impression and Plan:  66 year old woman with the following issues:  1. Renal mass representing with hypermetabolic lesion  measuring 9.5 x 7.9 x 8.5 cm in the posterior left kidney. PET/CT scan showed lesions in the lung, adrenal and possible tumor thrombus in the left renal vein.  She is status post biopsy on 07/04/2015. The biopsy confirmed the presence of renal cell carcinoma.  She on Votrient at 800 mg daily since April 2017. Her dose was reduced to 600 mg in October 2017.She tolerated dose much better.  CT scan obtain on 01/28/2016 showed stable disease.  The plan is to repeat imaging studies before the next visit in 03-Jul-2016. She continues to have excellent response, we'll continue Votrient and we will reduce the dose further because of weight loss. Different salvage therapy will be used if she has progression of disease.  2. Liver function surveillance: Her LFTs remain within normal range and we will continue to be monitored periodically. Her liver function tests within normal range on 04/21/2016  3. Diarrhea prophylaxis: She has Imodium with instructions how to use if she has recurrent diarrhea.  4. Renal insufficiency: Creatinine is close to baseline of 1.0.   6. Hand-foot  syndrome: Improved dramatically with decreasing the dose of Votrient.  7. Hip surgery: Completed in January 99991111 without complications.  8. Anorexia and weight loss: Related to Votrient and recent operation. A prescription for Megace and made available to the patient and dose reduction to Votrient will be considered.  9. Follow-up: Will be in 2016-07-03.   [facility], MD 2/21/201812:29 PM

## 2016-05-26 ENCOUNTER — Encounter (HOSPITAL_COMMUNITY): Payer: Self-pay

## 2016-05-26 ENCOUNTER — Emergency Department (HOSPITAL_COMMUNITY): Payer: Medicare Other

## 2016-05-26 ENCOUNTER — Emergency Department (HOSPITAL_COMMUNITY): Admit: 2016-05-26 | Discharge: 2016-05-26 | Disposition: A | Discharge status: Home / Self Care | Payer: Medicare Other

## 2016-05-26 ENCOUNTER — Inpatient Hospital Stay (HOSPITAL_COMMUNITY)
Admission: EM | Admit: 2016-05-26 | Discharge: 2016-05-27 | DRG: 176 | Disposition: A | Discharge status: Home / Self Care | Payer: Medicare Other | Attending: Family Medicine | Admitting: Family Medicine

## 2016-05-26 DIAGNOSIS — Z7951 Long term (current) use of inhaled steroids: Secondary | ICD-10-CM | POA: Diagnosis not present

## 2016-05-26 DIAGNOSIS — C7970 Secondary malignant neoplasm of unspecified adrenal gland: Secondary | ICD-10-CM | POA: Diagnosis present

## 2016-05-26 DIAGNOSIS — Z66 Do not resuscitate: Secondary | ICD-10-CM | POA: Diagnosis present

## 2016-05-26 DIAGNOSIS — Z7901 Long term (current) use of anticoagulants: Secondary | ICD-10-CM

## 2016-05-26 DIAGNOSIS — C7802 Secondary malignant neoplasm of left lung: Secondary | ICD-10-CM

## 2016-05-26 DIAGNOSIS — Z809 Family history of malignant neoplasm, unspecified: Secondary | ICD-10-CM | POA: Diagnosis not present

## 2016-05-26 DIAGNOSIS — Z85528 Personal history of other malignant neoplasm of kidney: Secondary | ICD-10-CM | POA: Diagnosis not present

## 2016-05-26 DIAGNOSIS — R0602 Shortness of breath: Secondary | ICD-10-CM

## 2016-05-26 DIAGNOSIS — R0600 Dyspnea, unspecified: Secondary | ICD-10-CM

## 2016-05-26 DIAGNOSIS — E785 Hyperlipidemia, unspecified: Secondary | ICD-10-CM | POA: Diagnosis present

## 2016-05-26 DIAGNOSIS — I129 Hypertensive chronic kidney disease with stage 1 through stage 4 chronic kidney disease, or unspecified chronic kidney disease: Secondary | ICD-10-CM | POA: Diagnosis present

## 2016-05-26 DIAGNOSIS — Z8249 Family history of ischemic heart disease and other diseases of the circulatory system: Secondary | ICD-10-CM

## 2016-05-26 DIAGNOSIS — N183 Chronic kidney disease, stage 3 (moderate): Secondary | ICD-10-CM | POA: Diagnosis present

## 2016-05-26 DIAGNOSIS — Z7982 Long term (current) use of aspirin: Secondary | ICD-10-CM | POA: Diagnosis not present

## 2016-05-26 DIAGNOSIS — Z794 Long term (current) use of insulin: Secondary | ICD-10-CM

## 2016-05-26 DIAGNOSIS — Z86711 Personal history of pulmonary embolism: Secondary | ICD-10-CM | POA: Diagnosis not present

## 2016-05-26 DIAGNOSIS — C641 Malignant neoplasm of right kidney, except renal pelvis: Secondary | ICD-10-CM | POA: Diagnosis not present

## 2016-05-26 DIAGNOSIS — E1122 Type 2 diabetes mellitus with diabetic chronic kidney disease: Secondary | ICD-10-CM | POA: Diagnosis present

## 2016-05-26 DIAGNOSIS — Z833 Family history of diabetes mellitus: Secondary | ICD-10-CM

## 2016-05-26 DIAGNOSIS — R079 Chest pain, unspecified: Secondary | ICD-10-CM | POA: Diagnosis not present

## 2016-05-26 DIAGNOSIS — Z90710 Acquired absence of both cervix and uterus: Secondary | ICD-10-CM | POA: Diagnosis not present

## 2016-05-26 DIAGNOSIS — D649 Anemia, unspecified: Secondary | ICD-10-CM | POA: Diagnosis present

## 2016-05-26 DIAGNOSIS — J90 Pleural effusion, not elsewhere classified: Secondary | ICD-10-CM | POA: Diagnosis present

## 2016-05-26 DIAGNOSIS — I2699 Other pulmonary embolism without acute cor pulmonale: Principal | ICD-10-CM

## 2016-05-26 DIAGNOSIS — M79609 Pain in unspecified limb: Secondary | ICD-10-CM | POA: Diagnosis not present

## 2016-05-26 DIAGNOSIS — Z96641 Presence of right artificial hip joint: Secondary | ICD-10-CM | POA: Diagnosis present

## 2016-05-26 DIAGNOSIS — Z885 Allergy status to narcotic agent status: Secondary | ICD-10-CM

## 2016-05-26 DIAGNOSIS — Z87891 Personal history of nicotine dependence: Secondary | ICD-10-CM

## 2016-05-26 DIAGNOSIS — C649 Malignant neoplasm of unspecified kidney, except renal pelvis: Secondary | ICD-10-CM | POA: Diagnosis not present

## 2016-05-26 HISTORY — DX: Other pulmonary embolism without acute cor pulmonale: I26.99

## 2016-05-26 HISTORY — DX: Other seasonal allergic rhinitis: J30.2

## 2016-05-26 HISTORY — DX: Anemia, unspecified: D64.9

## 2016-05-26 HISTORY — DX: Personal history of other medical treatment: Z92.89

## 2016-05-26 HISTORY — DX: Personal history of peptic ulcer disease: Z87.11

## 2016-05-26 HISTORY — DX: Malignant neoplasm of right kidney, except renal pelvis: C64.1

## 2016-05-26 HISTORY — DX: Unspecified convulsions: R56.9

## 2016-05-26 HISTORY — DX: Personal history of other diseases of the digestive system: Z87.19

## 2016-05-26 HISTORY — DX: Type 2 diabetes mellitus without complications: E11.9

## 2016-05-26 LAB — CBC WITH DIFFERENTIAL/PLATELET
Basophils Absolute: 0 10*3/uL (ref 0.0–0.1)
Basophils Relative: 0 %
Eosinophils Absolute: 0 10*3/uL (ref 0.0–0.7)
Eosinophils Relative: 0 %
HCT: 36.7 % (ref 36.0–46.0)
Hemoglobin: 11.5 g/dL — ABNORMAL LOW (ref 12.0–15.0)
Lymphocytes Relative: 25 %
Lymphs Abs: 2.3 10*3/uL (ref 0.7–4.0)
MCH: 30.3 pg (ref 26.0–34.0)
MCHC: 31.3 g/dL (ref 30.0–36.0)
MCV: 96.6 fL (ref 78.0–100.0)
Monocytes Absolute: 0.5 10*3/uL (ref 0.1–1.0)
Monocytes Relative: 5 %
Neutro Abs: 6.2 10*3/uL (ref 1.7–7.7)
Neutrophils Relative %: 70 %
Platelets: 480 10*3/uL — ABNORMAL HIGH (ref 150–400)
RBC: 3.8 MIL/uL — ABNORMAL LOW (ref 3.87–5.11)
RDW: 18.8 % — ABNORMAL HIGH (ref 11.5–15.5)
WBC: 9 10*3/uL (ref 4.0–10.5)

## 2016-05-26 LAB — COMPREHENSIVE METABOLIC PANEL
ALT: 11 U/L — ABNORMAL LOW (ref 14–54)
AST: 24 U/L (ref 15–41)
Albumin: 2.8 g/dL — ABNORMAL LOW (ref 3.5–5.0)
Alkaline Phosphatase: 74 U/L (ref 38–126)
Anion gap: 12 (ref 5–15)
BUN: 22 mg/dL — ABNORMAL HIGH (ref 6–20)
CO2: 22 mmol/L (ref 22–32)
Calcium: 8.8 mg/dL — ABNORMAL LOW (ref 8.9–10.3)
Chloride: 104 mmol/L (ref 101–111)
Creatinine, Ser: 1.82 mg/dL — ABNORMAL HIGH (ref 0.44–1.00)
GFR calc Af Amer: 32 mL/min — ABNORMAL LOW (ref >60)
GFR calc non Af Amer: 28 mL/min — ABNORMAL LOW (ref >60)
Glucose, Bld: 91 mg/dL (ref 65–99)
Potassium: 4 mmol/L (ref 3.5–5.1)
Sodium: 138 mmol/L (ref 135–145)
Total Bilirubin: 1 mg/dL (ref 0.3–1.2)
Total Protein: 6.7 g/dL (ref 6.5–8.1)

## 2016-05-26 LAB — I-STAT TROPONIN, ED
Troponin i, poc: 0.01 ng/mL (ref 0.00–0.08)
Troponin i, poc: 0 ng/mL (ref 0.00–0.08)

## 2016-05-26 LAB — BRAIN NATRIURETIC PEPTIDE: B Natriuretic Peptide: 56.6 pg/mL (ref 0.0–100.0)

## 2016-05-26 LAB — GLUCOSE, CAPILLARY: GLUCOSE-CAPILLARY: 230 mg/dL — AB (ref 65–99)

## 2016-05-26 LAB — TROPONIN I: TROPONIN I: 0.03 ng/mL — AB (ref <0.03)

## 2016-05-26 MED ORDER — ENSURE ENLIVE PO LIQD: 237.0000 mL | Freq: Two times a day (BID) | ORAL | Status: DC

## 2016-05-26 MED ORDER — ACETAMINOPHEN 650 MG RE SUPP: 650.0000 mg | Freq: Four times a day (QID) | RECTAL | Status: DC | PRN

## 2016-05-26 MED ORDER — POLYETHYLENE GLYCOL 3350 17 G PO PACK: 17.0000 g | PACK | Freq: Every day | ORAL | Status: DC | PRN

## 2016-05-26 MED ORDER — LOSARTAN POTASSIUM-HCTZ 100-25 MG PO TABS: 0.5000 | ORAL_TABLET | Freq: Every day | ORAL | Status: DC

## 2016-05-26 MED ORDER — LOSARTAN POTASSIUM 50 MG PO TABS
50.0000 mg | ORAL_TABLET | Freq: Every day | ORAL | Status: DC
  Administered 2016-05-26 – 2016-05-27 (×2): 50 mg via ORAL
  Filled 2016-05-26 (×2): qty 1

## 2016-05-26 MED ORDER — TECHNETIUM TC 99M DIETHYLENETRIAME-PENTAACETIC ACID: 30.0000 | Freq: Once | INTRAVENOUS | Status: DC | PRN

## 2016-05-26 MED ORDER — HYDROCHLOROTHIAZIDE 12.5 MG PO CAPS
12.5000 mg | ORAL_CAPSULE | Freq: Every day | ORAL | Status: DC
  Administered 2016-05-26 – 2016-05-27 (×2): 12.5 mg via ORAL
  Filled 2016-05-26 (×2): qty 1

## 2016-05-26 MED ORDER — ASPIRIN EC 81 MG PO TBEC
81.0000 mg | DELAYED_RELEASE_TABLET | Freq: Every day | ORAL | Status: DC
  Administered 2016-05-27: 81 mg via ORAL
  Filled 2016-05-26: qty 1

## 2016-05-26 MED ORDER — TECHNETIUM TO 99M ALBUMIN AGGREGATED
4.1000 | Freq: Once | INTRAVENOUS | Status: AC | PRN
  Administered 2016-05-26: 4.1 via INTRAVENOUS

## 2016-05-26 MED ORDER — METHOCARBAMOL 500 MG PO TABS: 500.0000 mg | ORAL_TABLET | Freq: Four times a day (QID) | ORAL | Status: DC | PRN

## 2016-05-26 MED ORDER — ACETAMINOPHEN 325 MG PO TABS: 650.0000 mg | ORAL_TABLET | Freq: Four times a day (QID) | ORAL | Status: DC | PRN

## 2016-05-26 MED ORDER — INSULIN ASPART 100 UNIT/ML ~~LOC~~ SOLN
0.0000 [IU] | Freq: Every day | SUBCUTANEOUS | Status: DC
  Administered 2016-05-26: 2 [IU] via SUBCUTANEOUS

## 2016-05-26 MED ORDER — ROSUVASTATIN CALCIUM 10 MG PO TABS
10.0000 mg | ORAL_TABLET | Freq: Every day | ORAL | Status: DC
  Administered 2016-05-26: 10 mg via ORAL
  Filled 2016-05-26: qty 1

## 2016-05-26 MED ORDER — BISACODYL 10 MG RE SUPP
10.0000 mg | Freq: Every day | RECTAL | Status: DC | PRN
Start: 2016-05-26 — End: 2016-05-27

## 2016-05-26 MED ORDER — EXENATIDE 10 MCG/0.04ML ~~LOC~~ SOPN: 10000.0000 ug | PEN_INJECTOR | Freq: Two times a day (BID) | SUBCUTANEOUS | Status: DC

## 2016-05-26 MED ORDER — PAZOPANIB HCL 200 MG PO TABS: 600.0000 mg | ORAL_TABLET | Freq: Every day | ORAL | Status: DC

## 2016-05-26 MED ORDER — INSULIN GLARGINE 100 UNIT/ML ~~LOC~~ SOLN
15.0000 [IU] | Freq: Every day | SUBCUTANEOUS | Status: DC
  Administered 2016-05-26: 15 [IU] via SUBCUTANEOUS
  Filled 2016-05-26 (×2): qty 0.15

## 2016-05-26 MED ORDER — OXYCODONE HCL 5 MG PO TABS
5.0000 mg | ORAL_TABLET | ORAL | Status: DC | PRN
  Administered 2016-05-27: 5 mg via ORAL
  Filled 2016-05-26: qty 1

## 2016-05-26 MED ORDER — LOPERAMIDE HCL 2 MG PO CAPS: 2.0000 mg | ORAL_CAPSULE | ORAL | Status: DC | PRN

## 2016-05-26 MED ORDER — PROCHLORPERAZINE MALEATE 10 MG PO TABS
10.0000 mg | ORAL_TABLET | Freq: Four times a day (QID) | ORAL | Status: DC | PRN
  Filled 2016-05-26: qty 1

## 2016-05-26 MED ORDER — SODIUM CHLORIDE 0.9% FLUSH
3.0000 mL | Freq: Two times a day (BID) | INTRAVENOUS | Status: DC
Start: 2016-05-26 — End: 2016-05-27

## 2016-05-26 MED ORDER — INSULIN ASPART 100 UNIT/ML ~~LOC~~ SOLN: 0.0000 [IU] | Freq: Three times a day (TID) | SUBCUTANEOUS | Status: DC

## 2016-05-26 MED ORDER — HEPARIN BOLUS VIA INFUSION
4000.0000 [IU] | Freq: Once | INTRAVENOUS | Status: AC
  Administered 2016-05-26: 4000 [IU] via INTRAVENOUS
  Filled 2016-05-26: qty 4000

## 2016-05-26 MED ORDER — HEPARIN (PORCINE) IN NACL 100-0.45 UNIT/ML-% IJ SOLN
1100.0000 [IU]/h | INTRAMUSCULAR | Status: DC
  Administered 2016-05-26: 1300 [IU]/h via INTRAVENOUS
  Filled 2016-05-26: qty 250

## 2016-05-26 NOTE — Progress Notes (Signed)
CRITICAL VALUE ALERT  Critical value received:  Troponin 0.03  Date of notification:  05/26/16  Time of notification:  1944  Critical value read back:yes  Nurse who received alert:  Samari Bittinger  MD notified (1st page):  [place]  Time of first page:  1950  MD notified (2nd page):  Time of second page:  Responding MD:  Brita Romp  Time MD responded:  [phone]

## 2016-05-26 NOTE — ED Notes (Signed)
Adjusted pt in bed with the assistance of Taylor(NT)

## 2016-05-26 NOTE — H&P (Signed)
[facility] Admission History and Physical Service Pager: [phone]  Patient name: Laura Neal Medical record number: [id number] Date of birth: 1950/11/19 Age: 66 y.o. Gender: female  Primary Care Provider: Phineas Inches, MD Consultants: none Code Status: DNR-confirmed on admission  Chief Complaint: SOB and L sided chest pain  Assessment and Plan: Laura Neal is a 66 y.o. female presenting with shortness of breath and chest pain and found to have pulmonary embolism . 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 Patient is at risk for VTE due to malignancy, recent surgery.  EKG nonischemic, troponins negative 2, BNP wnl. Chest x-ray with small left basal pleural effusion. Patient with out cough or fever to suggest pneumonia. Possibility of ACS causing chest pain and shortness of breath which will be evaluated as below. - Continue heparin drip per pharmacy - monitor closely for bleeding as patient has had recent lower GI bleed - To be transitioned to NOAC prior to discharge -  likely Eliquis given CKD - Monitor on telemetry, continuous pulse ox - Oxygen when necessary - Given patient is a much any medically stable, no indication for Echo to evaluate for right heart strain at this time - LE dopplers pending to evaluate for DVT - Stop megace as this could increase VTE risk  Chest pain: Likely related to PE above as was worsened with movement.  Heart score 4 for age and risk factors (HTN, T2 DM, HLD) - Trend troponins, repeat EKG in the morning - No indication for cardiology consult at this time  CKD 3, Renal cell carcinoma: Creatinine appears to be around baseline at 1.82 (baseline 1.2 - 1.8).  Currently taking chemotherapy Votrient 600mg  daily, due for follow-up with oncology in 05/2016. LFTs wnl (hepatotoxicity possible with chemotherapy agent) - Monitor Cr, avoid nephrotoxic medications - Continue home Votrient - Continue home imodium prn diarrhea (possible side effect of Votrient)  T2 DM: Last A1c 5.1 in 03/2016 - Decreased Lantus dose of 15 units qhs (home dose 30 units) - titrate up prn - Continue home byetta - Sensitive sliding scale insulin - Monitor CBGs  Normocytic anemia: Hemoglobin 11.5 which is improved from recent surgery - Monitor hemoglobin  S/p R total hip arthroplasty 04/22/16: Stable - Continue home Oxy IR 5mg  q4h prn and Robaxin - Ideally would wean narcotics as we are 1 month out from surgery  HLD, HTN: BP normotensive on admission, last LDL 162 in 10/2015 - Continue home Crestor, aspirin - Continue half dose of home losartan HCTZ - hold home lasix (unclear why patient takes this - she states swelling - no Echo available)  FEN/GI: Heart healthy/carb mod diet, SLIV Prophylaxis: Heparin per pharmacy as above  Disposition: Admit to [place], attending [facility], Pending clinical improvement, PT/OT consults  History of Present Illness: Laura Neal is a 66 y.o. female presenting with SOB and CP x1 day.  Patient reports left-sided sharp chest pain intermittent starting yesterday and associated with shortness of breath. Only present when moving. Lasted for a few minutes each time. Continues to be short of breath with any activity. No radiation, nausea, diaphoresis, previous chest pain, abdominal pain, cough, fever, urinary symptoms, diarrhea, constipation, vomiting.  She had rectal bleeding 1-2 episodes on 04/21/16 that resolved  by the next day on which she had a right total hip replacement. She is done well since her hip replacement.  She took Xarelto for 2 weeks after her hip replacement with no bleeding episodes.  Review Of Systems: Per HPI Otherwise 12 point  review of systems was performed and was unremarkable.  Patient Active Problem List   Diagnosis Date Noted  . Pulmonary embolus ([place]) 05/26/2016  . OA (osteoarthritis) of hip 04/22/2016  . Diabetes mellitus type 2, controlled ([place]) 12/09/2014   Past Medical History: Past Medical History:  Diagnosis Date  . Allergy   . Arthritis   . Blood transfusion   . Cancer ([place]) 2000   Kidney  . Diabetes mellitus    type II   . GERD (gastroesophageal reflux disease)   . Hyperlipidemia   . Hypertension   . Pneumonia    hx of   . Ulcer [facility])    Past Surgical History: Past Surgical History:  Procedure Laterality Date  . ABDOMINAL HYSTERECTOMY  1987  . BACK SURGERY    . ECTOPIC PREGNANCY SURGERY    . NEPHRECTOMY  2000   right-for cancer  . TOTAL HIP ARTHROPLASTY Right 04/22/2016   Procedure: RIGHT TOTAL HIP ARTHROPLASTY ANTERIOR APPROACH;  Surgeon: Gaynelle Arabian, MD;  Location: [facility] ORS;  Service: Orthopedics;  Laterality: Right;   Social History: Social History  Substance Use Topics  . Smoking status: Former Smoker    Quit date: 03/30/2004  . Smokeless tobacco: Former Systems developer  . Alcohol use No   Additional social history: Smoked 0.5 PPD x~73yrs, quit in 2006. Denies any alcohol use. Denies illicit drug use Please also refer to relevant sections of EMR.  Family History: Family History  Problem Relation Age of Onset  . Hypertension Mother   . Hypertension Father   . Diabetes Sister   . Cancer Sister   . Diabetes Brother   . Hypertension Brother   . Colon cancer Neg Hx    Allergies and Medications: Allergies  Allergen Reactions  . Codeine Rash   No current facility-administered medications on file prior to encounter.    Current Outpatient Prescriptions on File Prior to Encounter  Medication Sig Dispense Refill  . acetaminophen (TYLENOL) 325 MG tablet Take 2 tablets (650 mg total) by mouth every 6 (six) hours as needed for mild pain (or Fever >/= 101). 10 tablet 0  . bisacodyl  (DULCOLAX) 10 MG suppository Place 1 suppository (10 mg total) rectally daily as needed for moderate constipation. 12 suppository 0  . BYETTA 10 MCG PEN 10 MCG/0.04ML SOPN injection Inject 10 mg into the skin 2 (two) times daily.    . fluticasone (FLONASE) 50 MCG/ACT nasal spray Place 2 sprays into the nose daily. (Patient taking differently: Place 2 sprays into the nose daily as needed for allergies. ) 16 g 6  . furosemide (LASIX) 20 MG tablet Take 20 mg by mouth daily as needed for fluid or edema.     . insulin glargine (LANTUS) 100 UNIT/ML injection Inject 30 Units into the skin at bedtime.     [name] loperamide (IMODIUM) 2 MG capsule Take 2 mg by mouth as needed for diarrhea or loose stools (side effects of Votrient.).    [name] losartan-hydrochlorothiazide (HYZAAR) 100-25 MG tablet Take 1 tablet by mouth daily.    . megestrol (MEGACE) 400 MG/10ML suspension Take 10 mLs (400 mg total) by mouth daily. 240 mL 0  . methocarbamol (ROBAXIN) 500 MG tablet Take 1 tablet (500 mg total)  by mouth every 6 (six) hours as needed for muscle spasms. 80 tablet 0  . oxyCODONE (OXY IR/ROXICODONE) 5 MG immediate release tablet Take 1-2 tablets (5-10 mg total) by mouth every 4 (four) hours as needed for moderate pain or severe pain. 84 tablet 0  . pazopanib (VOTRIENT) 200 MG tablet Take 3 tablets (600 mg total) by mouth daily. Take on an empty stomach. 90 tablet 0  . polyethylene glycol (MIRALAX / GLYCOLAX) packet Take 17 g by mouth daily as needed for mild constipation. 14 each 0  . prochlorperazine (COMPAZINE) 10 MG tablet Take 1 tablet (10 mg total) by mouth every 6 (six) hours as needed for nausea or vomiting. 30 tablet 1  . rosuvastatin (CRESTOR) 10 MG tablet Take 10 mg by mouth at bedtime.     . docusate sodium (COLACE) 100 MG capsule Take 1 capsule (100 mg total) by mouth 2 (two) times daily as needed for mild constipation. (Patient not taking: Reported on 05/26/2016) 10 capsule 0  . rivaroxaban (XARELTO) 10 MG TABS  tablet Take 1 tablet (10 mg total) by mouth daily with breakfast. Take Xarelto for two and a half more weeks following discharge from the hospital, then discontinue Xarelto. Once the patient has completed the Xarelto, they may resume the 81 mg Aspirin. (Patient not taking: Reported on 05/26/2016) 17 tablet 0  . sodium phosphate (FLEET) 7-19 GM/118ML ENEM Place 133 mLs (1 enema total) rectally once as needed for severe constipation. 2 Bottle 0  . traMADol (ULTRAM) 50 MG tablet Take 1-2 tablets (50-100 mg total) by mouth every 6 (six) hours as needed (mild pain). 56 tablet 0    Objective: BP 124/75   Pulse 90   Temp 97.8 F (36.6 C) (Oral)   Resp 22   SpO2 93%  Exam: General: Lying comfortably in bed, NAD HEENT: NCAT, PERRL, EOMI, OP clear, MMM, tongue with multiple hyperpigmented areas Cardiovascular: RRR, no murmurs, rubs. Intact DP pulses bilaterally Respiratory: CTA B, normal work of breathing on room air Abdomen: Soft, NT ND, normal bowel sounds Extremities: WWP, no edema, no calf tenderness or cords palpable, negative Homans Skin: No dermatologic lesions noted Neuro: Alert and oriented 3, speech normal, strength and sensation intact in upper and lower extremities, no focal deficits  Labs and Imaging: CBC BMET   Recent Labs Lab 05/26/16 1145  WBC 9.0  HGB 11.5*  HCT 36.7  PLT 480*    Recent Labs Lab 05/26/16 1300  NA 138  K 4.0  CL 104  CO2 22  BUN 22*  CREATININE 1.82*  GLUCOSE 91  CALCIUM 8.8*     Troponin 0.01, 0 BNP 56.6 EKG: NSR, nonischemic  Dg Chest 2 View  Result Date: 05/26/2016 CLINICAL DATA:  Chest pain and shortness of breath since last night. Previous smoker discontinued twelve years ago. History of renal malignancy metastatic to the lungs, diabetes, and hypertension. EXAM: CHEST  2 VIEW COMPARISON:  Chest x-ray of April 4th 2007 as well as CT scan chest of January 28, 2016. FINDINGS: The lungs are adequately inflated. The lung markings are coarse  at the left lung base. A trace of pleural fluid is suspected on the left as well. There is subtle nodularity in the left suprahilar region. No definite abnormal nodules are seen elsewhere. The heart is normal in size. There is mild soft tissue fullness in the AP window. There is calcification in the wall of the thoracic aorta. There is mild multilevel degenerative disc disease of the  thoracic spine. There is subtle 1 cm nodular sclerotic density in the body of T7 or T8. IMPRESSION: Known metastatic renal cell malignancy. Subtle nodularity in the left suprahilar region with AP window soft tissue prominence worrisome for lymphadenopathy. Increased density at the left lung base with small left pleural effusion may reflect atelectasis or pneumonia or less likely metastatic disease. Followup PA and lateral chest X-ray is recommended in 3-4 weeks following trial of antibiotic therapy to ensure resolution and exclude underlying malignancy. Thoracic aortic atherosclerosis. Electronically Signed   By: David  Martinique M.D.   On: 05/26/2016 12:40   Nm Pulmonary Perf And Vent  Result Date: 05/26/2016 CLINICAL DATA:  Chest pain and shortness of breath. EXAM: NUCLEAR MEDICINE VENTILATION - PERFUSION LUNG SCAN TECHNIQUE: Ventilation images were obtained in multiple projections using inhaled aerosol Tc-38m DTPA. Perfusion images were obtained in multiple projections after intravenous injection of Tc-42m MAA. RADIOPHARMACEUTICALS:  31.0 MCi Technetium-58m DTPA aerosol inhalation and 4.1 mCi Technetium-87m MAA IV COMPARISON:  Chest x-ray 05/26/2016 FINDINGS: Ventilation: No segmental or subsegmental ventilation defects. Some central deposition of the radiopharmaceutical. Perfusion: Multiple segmental and subsegmental perfusion defects consistent with pulmonary embolism. IMPRESSION: High probability ventilation perfusion lung scan for pulmonary embolism. These results were called by telephone at the time of interpretation on  05/26/2016 at 4:04 pm to Dr. Eliezer Mccoy , who verbally acknowledged these results. Electronically Signed   By: Marijo Sanes M.D.   On: 05/26/2016 16:04    Virginia Crews, MD 05/26/2016, 4:58 PM PGY-3, [facility] pager: [phone], text pages welcome

## 2016-05-26 NOTE — ED Notes (Signed)
After heparin bolus pt. States her IV was stinging. RN stopped medication until new IV access.

## 2016-05-26 NOTE — ED Triage Notes (Signed)
Per EMS - pt began experiencing sharp substernal CP last night. Was able to sleep but woke up this morning with worse pain. Denies n/v. Dyspnea w/ exertion. Kidney cancer - currently taking PO chemo. Denies CP while at rest. Given 324mg  aspirin.

## 2016-05-26 NOTE — Progress Notes (Signed)
Pt has arrived to 2w from ED. Telemetry box applied and CCMD notified. Pt oriented to room. Vitals obtained. Pt ordering dinner tray. Pt denies needs at this time.  Grant Fontana BSN, RN

## 2016-05-26 NOTE — ED Provider Notes (Signed)
[facility] DEPT Provider Note   CSN: [id number] Arrival date & time: 05/26/16  1131     History   Chief Complaint Chief Complaint  Patient presents with  . Chest Pain    HPI Laura Neal is a 66 y.o. female with history of kidney cancer on PO chemotherapy and right hip replacement 1 month ago who presents with sudden onset sharp chest pain began last night. Patient states the pain is worse with exertion and with inspiration. Patient has had associated shortness of breath on exertion. The pain does not radiate. Patient has not taken any medications for her pain. Patient states she has had pain related to her hip replacement, and does note that she has had some lower calf pain. She denies any recent long trips on hormone replacement. Patient denies any abdominal pain, nausea, vomiting, urinary symptoms.  HPI  Past Medical History:  Diagnosis Date  . Allergy   . Arthritis   . Blood transfusion   . Cancer ([place]) 2000   Kidney  . Diabetes mellitus    type II   . GERD (gastroesophageal reflux disease)   . Hyperlipidemia   . Hypertension   . Pneumonia    hx of   . Ulcer [facility])     Patient Active Problem List   Diagnosis Date Noted  . OA (osteoarthritis) of hip 04/22/2016  . Diabetes mellitus type 2, controlled ([place]) 12/09/2014    Past Surgical History:  Procedure Laterality Date  . ABDOMINAL HYSTERECTOMY  1987  . BACK SURGERY    . ECTOPIC PREGNANCY SURGERY    . NEPHRECTOMY  2000   right-for cancer  . TOTAL HIP ARTHROPLASTY Right 04/22/2016   Procedure: RIGHT TOTAL HIP ARTHROPLASTY ANTERIOR APPROACH;  Surgeon: Gaynelle Arabian, MD;  Location: [facility] ORS;  Service: Orthopedics;  Laterality: Right;    OB History    No data available       Home Medications    Prior to Admission medications   Medication Sig Start Date End Date Taking? Authorizing Provider  acetaminophen (TYLENOL) 325 MG tablet Take 2 tablets (650 mg total) by mouth every 6 (six) hours as needed for  mild pain (or Fever >/= 101). 04/27/16  Yes Alexzandrew L Dara Lords, PA-C  aspirin EC 81 MG tablet Take 81 mg by mouth daily.   Yes Historical Provider, MD  bisacodyl (DULCOLAX) 10 MG suppository Place 1 suppository (10 mg total) rectally daily as needed for moderate constipation. 04/27/16  Yes Alexzandrew L Perkins, PA-C  BYETTA 10 MCG PEN 10 MCG/0.04ML SOPN injection Inject 10 mg into the skin 2 (two) times daily. 05/13/15  Yes Historical Provider, MD  fluticasone (FLONASE) 50 MCG/ACT nasal spray Place 2 sprays into the nose daily. Patient taking differently: Place 2 sprays into the nose daily as needed for allergies.  11/26/12  Yes Robyn Haber, MD  furosemide (LASIX) 20 MG tablet Take 20 mg by mouth daily as needed for fluid or edema.  10/23/14  Yes Historical Provider, MD  insulin glargine (LANTUS) 100 UNIT/ML injection Inject 30 Units into the skin at bedtime.    Yes Historical Provider, MD  loperamide (IMODIUM) 2 MG capsule Take 2 mg by mouth as needed for diarrhea or loose stools (side effects of Votrient.).   Yes Historical Provider, MD  losartan-hydrochlorothiazide (HYZAAR) 100-25 MG tablet Take 1 tablet by mouth daily. 05/07/15  Yes Historical Provider, MD  megestrol (MEGACE) 400 MG/10ML suspension Take 10 mLs (400 mg total) by mouth daily. 05/20/16  Yes Roxy Cedar  Jefm Bryant, MD  methocarbamol (ROBAXIN) 500 MG tablet Take 1 tablet (500 mg total) by mouth every 6 (six) hours as needed for muscle spasms. 04/27/16  Yes Alexzandrew L Perkins, PA-C  oxyCODONE (OXY IR/ROXICODONE) 5 MG immediate release tablet Take 1-2 tablets (5-10 mg total) by mouth every 4 (four) hours as needed for moderate pain or severe pain. 04/27/16  Yes Alexzandrew L Perkins, PA-C  pazopanib (VOTRIENT) 200 MG tablet Take 3 tablets (600 mg total) by mouth daily. Take on an empty stomach. 05/04/16  Yes Wyatt Portela, MD  polyethylene glycol (MIRALAX / GLYCOLAX) packet Take 17 g by mouth daily as needed for mild constipation. 04/27/16  Yes  Alexzandrew L Perkins, PA-C  prochlorperazine (COMPAZINE) 10 MG tablet Take 1 tablet (10 mg total) by mouth every 6 (six) hours as needed for nausea or vomiting. 07/23/15  Yes Wyatt Portela, MD  rosuvastatin (CRESTOR) 10 MG tablet Take 10 mg by mouth at bedtime.  11/18/15  Yes Historical Provider, MD  docusate sodium (COLACE) 100 MG capsule Take 1 capsule (100 mg total) by mouth 2 (two) times daily as needed for mild constipation. Patient not taking: Reported on 05/26/2016 04/27/16   Alexzandrew L Dara Lords, PA-C  rivaroxaban (XARELTO) 10 MG TABS tablet Take 1 tablet (10 mg total) by mouth daily with breakfast. Take Xarelto for two and a half more weeks following discharge from the hospital, then discontinue Xarelto. Once the patient has completed the Xarelto, they may resume the 81 mg Aspirin. Patient not taking: Reported on 05/26/2016 04/27/16   Alexzandrew L Perkins, PA-C  sodium phosphate (FLEET) 7-19 GM/118ML ENEM Place 133 mLs (1 enema total) rectally once as needed for severe constipation. 04/27/16   Alexzandrew L Perkins, PA-C  traMADol (ULTRAM) 50 MG tablet Take 1-2 tablets (50-100 mg total) by mouth every 6 (six) hours as needed (mild pain). 04/27/16   Alexzandrew Monika Salk, PA-C    Family History Family History  Problem Relation Age of Onset  . Hypertension Mother   . Hypertension Father   . Diabetes Sister   . Cancer Sister   . Diabetes Brother   . Hypertension Brother   . Colon cancer Neg Hx     Social History Social History  Substance Use Topics  . Smoking status: Former Smoker    Quit date: 03/30/2004  . Smokeless tobacco: Former Systems developer  . Alcohol use No     Allergies   Codeine   Review of Systems Review of Systems  Constitutional: Negative for chills and fever.  HENT: Negative for facial swelling and sore throat.   Respiratory: Positive for shortness of breath.   Cardiovascular: Positive for chest pain.  Gastrointestinal: Negative for abdominal pain, nausea and  vomiting.  Genitourinary: Negative for dysuria.  Musculoskeletal: Negative for back pain.  Skin: Negative for rash and wound.  Neurological: Negative for headaches.  Psychiatric/Behavioral: The patient is not nervous/anxious.      Physical Exam Updated Vital Signs BP 121/65   Pulse 86   Temp 97.8 F (36.6 C) (Oral)   Resp 19   SpO2 94%   Physical Exam  Constitutional: She appears well-developed and well-nourished. No distress.  HENT:  Head: Normocephalic and atraumatic.  Mouth/Throat: Oropharynx is clear and moist. No oropharyngeal exudate.  Eyes: Conjunctivae are normal. Pupils are equal, round, and reactive to light. Right eye exhibits no discharge. Left eye exhibits no discharge. No scleral icterus.  Neck: Normal range of motion. Neck supple. No thyromegaly present.  Cardiovascular:  Normal rate, regular rhythm, normal heart sounds and intact distal pulses.  Exam reveals no gallop and no friction rub.   No murmur heard. Pulmonary/Chest: Effort normal and breath sounds normal. No stridor. No respiratory distress. She has no wheezes. She has no rales. She exhibits no tenderness.  Abdominal: Soft. Bowel sounds are normal. She exhibits no distension. There is no tenderness. There is no rebound and no guarding.  Musculoskeletal: She exhibits no edema.  Mild R calf tenderness  Lymphadenopathy:    She has no cervical adenopathy.  Neurological: She is alert. Coordination normal.  Skin: Skin is warm and dry. No rash noted. She is not diaphoretic. No pallor.  Psychiatric: She has a normal mood and affect.  Nursing note and vitals reviewed.    ED Treatments / Results  Labs (all labs ordered are listed, but only abnormal results are displayed) Labs Reviewed  CBC WITH DIFFERENTIAL/PLATELET - Abnormal; Notable for the following:       Result Value   RBC 3.80 (*)    Hemoglobin 11.5 (*)    RDW 18.8 (*)    Platelets 480 (*)    All other components within normal limits    COMPREHENSIVE METABOLIC PANEL - Abnormal; Notable for the following:    BUN 22 (*)    Creatinine, Ser 1.82 (*)    Calcium 8.8 (*)    Albumin 2.8 (*)    ALT 11 (*)    GFR calc non Af Amer 28 (*)    GFR calc Af Amer 32 (*)    All other components within normal limits  BRAIN NATRIURETIC PEPTIDE  I-STAT TROPOININ, ED  I-STAT TROPOININ, ED    EKG  EKG Interpretation  Date/Time:  Tuesday May 26 2016 11:36:47 EST Ventricular Rate:  92 PR Interval:  180 QRS Duration: 64 QT Interval:  346 QTC Calculation: 427 R Axis:   61 Text Interpretation:  Normal sinus rhythm Low voltage QRS Borderline ECG Confirmed by DELO  MD, DOUGLAS ([id number]) on 05/26/2016 1:35:05 PM       Radiology Dg Chest 2 View  Result Date: 05/26/2016 CLINICAL DATA:  Chest pain and shortness of breath since last night. Previous smoker discontinued twelve years ago. History of renal malignancy metastatic to the lungs, diabetes, and hypertension. EXAM: CHEST  2 VIEW COMPARISON:  Chest x-ray of April 4th 2007 as well as CT scan chest of January 28, 2016. FINDINGS: The lungs are adequately inflated. The lung markings are coarse at the left lung base. A trace of pleural fluid is suspected on the left as well. There is subtle nodularity in the left suprahilar region. No definite abnormal nodules are seen elsewhere. The heart is normal in size. There is mild soft tissue fullness in the AP window. There is calcification in the wall of the thoracic aorta. There is mild multilevel degenerative disc disease of the thoracic spine. There is subtle 1 cm nodular sclerotic density in the body of T7 or T8. IMPRESSION: Known metastatic renal cell malignancy. Subtle nodularity in the left suprahilar region with AP window soft tissue prominence worrisome for lymphadenopathy. Increased density at the left lung base with small left pleural effusion may reflect atelectasis or pneumonia or less likely metastatic disease. Followup PA and lateral chest  X-ray is recommended in 3-4 weeks following trial of antibiotic therapy to ensure resolution and exclude underlying malignancy. Thoracic aortic atherosclerosis. Electronically Signed   By: David  Martinique M.D.   On: 05/26/2016 12:40    Procedures Procedures (including  critical care time)  Medications Ordered in ED Medications  technetium TC 68M diethylenetriame-pentaacetic acid (DTPA) injection 30 millicurie (not administered)  technetium albumin aggregated (MAA) injection solution 4.1 millicurie (4.1 millicuries Intravenous Contrast Given 05/26/16 1513)     Initial Impression / Assessment and Plan / ED Course  I have reviewed the triage vital signs and the nursing notes.  Pertinent labs & imaging results that were available during my care of the patient were reviewed by me and considered in my medical decision making (see chart for details).     HEAR score 4. CBC shows hemoglobin 11.5. CMP shows BUN 22, creatinine 1.82. Initial troponin negative. Delta troponin pending. BNP 56.6. EKG shows NSR. CXR shows [Known metastatic renal cell malignancy. Subtle nodularity in the left suprahilar region with AP window soft tissue prominence worrisome for lymphadenopathy. Increased density at the left lung base with small left pleural effusion may reflect atelectasis or pneumonia or less likely metastatic disease. Followup PA and lateral chest X-ray is recommended in 3-4 weeks following trial of antibiotic therapy to ensure resolution and exclude underlying malignancy.] V/Q scan pending. If negative, d/c home with Levaquin if patient can ambulate with good O2 sats. At shift change, patient care transferred to Quincy Carnes, PA-C for continued evaluation, follow up of V/Q scan and determination of disposition.      Final Clinical Impressions(s) / ED Diagnoses   Final diagnoses:  Shortness of breath  Chest pain    New Prescriptions New Prescriptions   No medications on file     Frederica Kuster,  PA-C 05/26/16 1541    Veryl Speak, MD 05/27/16 [phone]

## 2016-05-26 NOTE — Progress Notes (Signed)
ANTICOAGULATION CONSULT NOTE - Initial Consult  Pharmacy Consult for heparin Indication: pulmonary embolism  Allergies  Allergen Reactions  . Codeine Rash    Patient Measurements:   Heparin Dosing Weight: 84 kg  Vital Signs: Temp: 97.8 F (36.6 C) (02/27 1142) Temp Source: Oral (02/27 1142) BP: 121/65 (02/27 1436) Pulse Rate: 86 (02/27 1436)  Labs:  Recent Labs  05/26/16 1145 05/26/16 1300  HGB 11.5*  --   HCT 36.7  --   PLT 480*  --   CREATININE  --  1.82*    Estimated Creatinine Clearance: 35.8 mL/min (by C-G formula based on SCr of 1.82 mg/dL (H)).   Medical History: Past Medical History:  Diagnosis Date  . Allergy   . Arthritis   . Blood transfusion   . Cancer ([place]) 2000   Kidney  . Diabetes mellitus    type II   . GERD (gastroesophageal reflux disease)   . Hyperlipidemia   . Hypertension   . Pneumonia    hx of   . Ulcer ([place])     Assessment: 41 yoF presents w/ sharp substernal CP. VQ scan with high probability of PE, CT is pending. Pt has PMH of recent R hip replacement 1 month ago and completed a course of Xarelto about 2 weeks ago, but is currently only on aspirin. Hgb a little low but appears close to baseline, troponin negative.  Goal of Therapy:  Heparin level 0.3-0.7 units/ml Monitor platelets by anticoagulation protocol: Yes   Plan:  -Heparin 4000 units x1 -Heparin 1300 units/hr -Check 6-hr heparin level -Monitor heparin level, CBC, S/Sx bleeding daily  Arrie Senate, PharmD PGY-1 Pharmacy Resident Pager: [phone] 05/26/2016

## 2016-05-26 NOTE — ED Notes (Signed)
Venous ultrasound at bedside.

## 2016-05-26 NOTE — ED Notes (Signed)
Pt. Transported to [facility].

## 2016-05-26 NOTE — ED Provider Notes (Signed)
Assumed care from PA Law at shift change.  See her note for full H&P.  Briefly, 66 y.o. F with hx of renal cancer on oral chemo and hx of recent right hip replacement 1 month ago here with chest pain and SOB on exertion.  Has had some lower calf pain as well.  Lab work appears baseline for patient when compared with prior.  SrCr slightly increased from baseline.  Trop and BNP normal.  CXR with question of atelectasis vs pneumonia, vs metastatic disease.    Plan:  VQ scan pending.  If negative along with delta troponin and patient is able to ambulate without hypoxia, will plan to discharge home on levaquin for questionable pneumonia on her CXR.  4:06 PM Received call from radiology that patient has high probability of PE on her VQ scan.  She has had some calf pain as well so will add venous duplexes of her legs.  Patient does have hx of GI bleeding last month, states she did not have GI follow-up since this time.  She denies any significant bloody stools recently. Will start heparin.  Will need close monitoring of this to ensure no recurrent GI blood loss.  Pharmacy to dose.  CXR findings were somewhat concerning for atelectasis versus pneumonia versus metastatic disease, however patient has had no cough, fever, nasal congestion, or other URI type symptoms.  Will hold on abx at this time.   Patient admitted to family practice teaching service.  Temp admit orders placed.   Larene Pickett, PA-C 05/26/16 1729    Duffy Bruce, MD 05/27/16 [phone]

## 2016-05-26 NOTE — ED Notes (Signed)
Lab notified CMP hemolyzed. New sample to be drawn.

## 2016-05-26 NOTE — Progress Notes (Signed)
**  Preliminary report by tech**  Bilateral lower extremity venous duplex complete. There is evidence of acute deep vein thrombosis involving the posterior tibial, and peroneal veins of the left lower extremity. There is no evidence of superficial vein thrombosis involving the left lower extremity. There is no evidence of deep or superficial vein thrombosis involving the right lower extremity. There is no evidence of a Baker's cyst bilaterally. Results were given to the patient's nurse, Erin.  05/26/16 5:46 PM Carlos Levering RVT

## 2016-05-27 ENCOUNTER — Inpatient Hospital Stay (HOSPITAL_COMMUNITY): Payer: Medicare Other

## 2016-05-27 DIAGNOSIS — E1122 Type 2 diabetes mellitus with diabetic chronic kidney disease: Secondary | ICD-10-CM

## 2016-05-27 DIAGNOSIS — R0602 Shortness of breath: Secondary | ICD-10-CM

## 2016-05-27 DIAGNOSIS — N183 Chronic kidney disease, stage 3 (moderate): Secondary | ICD-10-CM

## 2016-05-27 DIAGNOSIS — C649 Malignant neoplasm of unspecified kidney, except renal pelvis: Secondary | ICD-10-CM

## 2016-05-27 DIAGNOSIS — R079 Chest pain, unspecified: Secondary | ICD-10-CM

## 2016-05-27 DIAGNOSIS — I2699 Other pulmonary embolism without acute cor pulmonale: Principal | ICD-10-CM

## 2016-05-27 DIAGNOSIS — R0600 Dyspnea, unspecified: Secondary | ICD-10-CM

## 2016-05-27 DIAGNOSIS — C7802 Secondary malignant neoplasm of left lung: Secondary | ICD-10-CM

## 2016-05-27 DIAGNOSIS — C641 Malignant neoplasm of right kidney, except renal pelvis: Secondary | ICD-10-CM

## 2016-05-27 LAB — GLUCOSE, CAPILLARY
GLUCOSE-CAPILLARY: 82 mg/dL (ref 65–99)
Glucose-Capillary: 100 mg/dL — ABNORMAL HIGH (ref 65–99)
Glucose-Capillary: 79 mg/dL (ref 65–99)

## 2016-05-27 LAB — HEPARIN LEVEL (UNFRACTIONATED)
HEPARIN UNFRACTIONATED: 1.04 [IU]/mL — AB (ref 0.30–0.70)
Heparin Unfractionated: 1.22 IU/mL — ABNORMAL HIGH (ref 0.30–0.70)

## 2016-05-27 LAB — BASIC METABOLIC PANEL
Anion gap: 11 (ref 5–15)
BUN: 22 mg/dL — AB (ref 6–20)
CHLORIDE: 105 mmol/L (ref 101–111)
CO2: 22 mmol/L (ref 22–32)
CREATININE: 1.61 mg/dL — AB (ref 0.44–1.00)
Calcium: 8.5 mg/dL — ABNORMAL LOW (ref 8.9–10.3)
GFR calc Af Amer: 37 mL/min — ABNORMAL LOW (ref >60)
GFR calc non Af Amer: 32 mL/min — ABNORMAL LOW (ref >60)
Glucose, Bld: 100 mg/dL — ABNORMAL HIGH (ref 65–99)
Potassium: 3.5 mmol/L (ref 3.5–5.1)
Sodium: 138 mmol/L (ref 135–145)

## 2016-05-27 LAB — CBC
HCT: 35 % — ABNORMAL LOW (ref 36.0–46.0)
Hemoglobin: 10.8 g/dL — ABNORMAL LOW (ref 12.0–15.0)
MCH: 29.7 pg (ref 26.0–34.0)
MCHC: 30.9 g/dL (ref 30.0–36.0)
MCV: 96.2 fL (ref 78.0–100.0)
Platelets: 412 10*3/uL — ABNORMAL HIGH (ref 150–400)
RBC: 3.64 MIL/uL — ABNORMAL LOW (ref 3.87–5.11)
RDW: 17.7 % — AB (ref 11.5–15.5)
WBC: 8.2 10*3/uL (ref 4.0–10.5)

## 2016-05-27 LAB — ECHOCARDIOGRAM COMPLETE
Height: 68 in
Weight: 3280 oz

## 2016-05-27 LAB — TROPONIN I
Troponin I: 0.04 ng/mL (ref <0.03)
Troponin I: <0.03 ng/mL (ref <0.03)

## 2016-05-27 MED ORDER — PAZOPANIB HCL 200 MG PO TABS: 600.0000 mg | ORAL_TABLET | Freq: Every day | ORAL | Status: DC

## 2016-05-27 MED ORDER — APIXABAN 5 MG PO TABS: 10.0000 mg | ORAL_TABLET | Freq: Two times a day (BID) | ORAL | 0 refills | Status: DC

## 2016-05-27 MED ORDER — HYDROCHLOROTHIAZIDE 12.5 MG PO CAPS: 12.5000 mg | ORAL_CAPSULE | Freq: Every day | ORAL | 0 refills | Status: DC

## 2016-05-27 MED ORDER — LOSARTAN POTASSIUM 50 MG PO TABS: 50.0000 mg | ORAL_TABLET | Freq: Every day | ORAL | 0 refills | Status: DC

## 2016-05-27 MED ORDER — APIXABAN 5 MG PO TABS: 5.0000 mg | ORAL_TABLET | Freq: Two times a day (BID) | ORAL | 0 refills | Status: DC

## 2016-05-27 MED ORDER — EXENATIDE 10 MCG/0.04ML ~~LOC~~ SOPN: 10.0000 ug | PEN_INJECTOR | Freq: Two times a day (BID) | SUBCUTANEOUS | Status: DC

## 2016-05-27 MED ORDER — APIXABAN 5 MG PO TABS
10.0000 mg | ORAL_TABLET | Freq: Two times a day (BID) | ORAL | Status: DC
  Administered 2016-05-27: 10 mg via ORAL
  Filled 2016-05-27: qty 2

## 2016-05-27 MED ORDER — APIXABAN 5 MG PO TABS: 5.0000 mg | ORAL_TABLET | Freq: Two times a day (BID) | ORAL | Status: DC

## 2016-05-27 NOTE — Progress Notes (Signed)
Family Medicine Teaching Service Daily Progress Note Intern Pager: [phone]  Patient name: Laura Neal Medical record number: [id number] Date of birth: Nov 05, 1950 Age: 66 y.o. Gender: female  Primary Care Provider: Phineas Inches, MD Consultants: None Code Status: DNR (confirmed on admission)  Pt Overview and Major Events to Date:  05/26/2016 - admit to FPTS, VQ scan high probability PE, heparin gtt started  Assessment and Plan: Laura Neal is a 66 y.o. female presenting with shortness of breath and chest pain and found to have pulmonary embolism . 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 - Continue heparin drip per pharmacy - monitor closely for bleeding as patient has had recent lower GI bleed - To be transitioned to NOAC prior to discharge -  likely Eliquis given CKD - Monitor on telemetry, continuous pulse ox - Oxygen when necessary  Chest pain: Likely related to PE above as was worsened with movement.  Heart score 4 for age and risk factors (HTN, T2 DM, HLD) EKG nonischemic, troponins 0.03>0.04>0.03 , BNP wnl. - No indication for cardiology consult at this time  CKD 3, Renal cell carcinoma: Cr a1.82 on admission (baseline 1.2 - 1.8). Currently taking chemotherapy Votrient 600mg  daily, due for follow-up with oncology in 05/2016.  - Monitor Cr, avoid nephrotoxic medications - Continue home Votrient - Continue home imodium prn diarrhea (possible side effect of Votrient)  T2 DM: Last A1c 5.1 in 03/2016. Holding home lantus 30u. CBG 230, 100 o/n, used 2u sliding scale - continue Lantus dose of 15 units qhs  - titrate up prn - Continue  home byetta - Sensitive sliding scale insulin - Monitor CBGs  Normocytic anemia: Hemoglobin 11.5 which is improved from recent surgery. - Monitor hemoglobin - AM Hgb 10.8  S/p R total hip arthroplasty 04/22/16: Stable - Continue home Oxy IR 5mg  q4h prn and Robaxin - Ideally would wean narcotics as we are 1 month out from surgery  HLD, HTN: BP normotensive on admission, last LDL 162 in 10/2015 - Continue home Crestor, aspirin - Continue half dose of home losartan HCTZ - hold home lasix (unclear why patient takes this - she states swelling - no Echo available)  FEN/GI: Heart healthy/carb mod diet, SLIV Prophylaxis: Heparin per pharmacy as above  Disposition: Pending clinical improvement, PT/OT consults  Subjective:  Patient did well overnight with no acute events. Patient feels dyspnea is stable and has been on room air throughout the night.  Objective: Temp:  [98.2 F (36.8 C)-98.6 F (37 C)] 98.2 F (36.8 C) (02/28 0505) Pulse Rate:  [84-98] 84 (02/28 1349) Resp:  [18-23] 20 (02/28 1349) BP: (94-129)/(60-77) 94/62 (02/28 1349) SpO2:  [92 %-97 %] 95 % (02/28 1349) Weight:  [93 kg (205 lb)] 93 kg (205 lb) (02/28 0925) Physical Exam: General: NAD, rests comfortably Cardiovascular: RRR, no m/r/g Respiratory: CTA bil, no W/R/R, +TTP over left chest wall Abdomen: soft, nontender, nondistended Extremities: +L homan sign without cords, no LE edema or erythema  Laboratory:  Recent Labs Lab 05/26/16 1145 05/27/16 0631  WBC 9.0 8.2  HGB 11.5* 10.8*  HCT 36.7 35.0*  PLT 480* 412*    Recent Labs Lab 05/26/16 1300 05/27/16 0631  NA 138 138  K 4.0 3.5  CL 104 105  CO2 22 22  BUN 22* 22*  CREATININE 1.82* 1.61*  CALCIUM 8.8* 8.5*  PROT 6.7  --   BILITOT 1.0  --   ALKPHOS 74  --   ALT 11*  --   AST 24  --   GLUCOSE 91 100*    Imaging/Diagnostic Tests: Lower Extremity U/S 2/27 Summary: There is evidence of acute deep vein thrombosis involving  the posterior tibial, and peroneal veins of the left lower extremity.  There is no evidence of superficial vein thrombosis involving the left lower extremity. There is no evidence of deep or superficial vein thrombosis involving the right lower extremity. There is no evidence of a Baker&'s cyst bilaterally. Other specific details can be found in the table(s) above.  Dg Chest 2 View 05/26/2016 FINDINGS: The lungs are adequately inflated. The lung markings are coarse at the left lung base. A trace of pleural fluid is suspected on the left as well. There is subtle nodularity in the left suprahilar region. No definite abnormal nodules are seen elsewhere. The heart is normal in size. There is mild soft tissue fullness in the AP window. There is calcification in the wall of the thoracic aorta. There is mild multilevel degenerative disc disease of the thoracic spine. There is subtle 1 cm nodular sclerotic density in the body of T7 or T8.  IMPRESSION:  - Known metastatic renal cell malignancy.  - Subtle nodularity in the left suprahilar region with AP window soft tissue prominence worrisome for lymphadenopathy.  - Increased density at the left lung base with small left pleural effusion may reflect atelectasis or pneumonia or less likely metastatic disease. Followup PA and lateral chest X-ray is recommended in 3-4 weeks following trial of antibiotic therapy to ensure resolution and exclude underlying malignancy.  - Thoracic aortic atherosclerosis.   Nm Pulmonary Perf And Vent: 05/26/2016 FINDINGS: Ventilation: No segmental or subsegmental ventilation defects. Some central deposition of the radiopharmaceutical. Perfusion: Multiple segmental and subsegmental perfusion defects consistent with pulmonary embolism.  IMPRESSION:  High probability ventilation perfusion lung scan for pulmonary embolism.   Laura Coombe, MD 05/27/2016, 1:54 PM PGY-1, [facility] Intern pager: [phone], text  pages welcome

## 2016-05-27 NOTE — Discharge Instructions (Signed)
Information on my medicine - ELIQUIS (apixaban)  This medication education was reviewed with me or my healthcare representative as part of my discharge preparation.  The pharmacist that spoke with me during my hospital stay was:  Carlean Jews, [facility]  Why was Eliquis prescribed for you? Eliquis was prescribed to treat blood clots that may have been found in the veins of your legs (deep vein thrombosis) or in your lungs (pulmonary embolism) and to reduce the risk of them occurring again.  What do You need to know about Eliquis ? The starting dose is 10 mg (two 5 mg tablets) taken TWICE daily for the FIRST SEVEN (7) DAYS, then on 06/03/16  the dose is reduced to ONE 5 mg tablet taken TWICE daily.  Eliquis may be taken with or without food.   Try to take the dose about the same time in the morning and in the evening. If you have difficulty swallowing the tablet whole please discuss with your pharmacist how to take the medication safely.  Take Eliquis exactly as prescribed and DO NOT stop taking Eliquis without talking to the doctor who prescribed the medication.  Stopping may increase your risk of developing a new blood clot.  Refill your prescription before you run out.  After discharge, you should have regular check-up appointments with your healthcare provider that is prescribing your Eliquis.    What do you do if you miss a dose? If a dose of ELIQUIS is not taken at the scheduled time, take it as soon as possible on the same day and twice-daily administration should be resumed. The dose should not be doubled to make up for a missed dose.  Important Safety Information A possible side effect of Eliquis is bleeding. You should call your healthcare provider right away if you experience any of the following: ? Bleeding from an injury or your nose that does not stop. ? Unusual colored urine (red or dark brown) or unusual colored stools (red or black). ? Unusual bruising for unknown  reasons. ? A serious fall or if you hit your head (even if there is no bleeding).  Some medicines may interact with Eliquis and might increase your risk of bleeding or clotting while on Eliquis. To help avoid this, consult your healthcare provider or pharmacist prior to using any new prescription or non-prescription medications, including herbals, vitamins, non-steroidal anti-inflammatory drugs (NSAIDs) and supplements.  This website has more information on Eliquis (apixaban): http://www.eliquis.com/eliquis/home

## 2016-05-27 NOTE — Progress Notes (Signed)
Per [employer] check for Eliquis S/W ABI @ [place] # [phone]   1. ELIQUIS 5 MG BID  COVER- YES  CO-PAY- $ 23.00  PRIOR APPROVAL- NO   PREFERRED PHARMACY: [facility]  MAIL -ORDER FOR 90 DAY SUPPLY $ 63.00

## 2016-05-27 NOTE — Care Management Note (Signed)
Case Management Note Marvetta Gibbons RN, BSN Unit 2W-Case Manager [phone]  Patient Details  Name: Laura Neal MRN: [id number] Date of Birth: February 26, 1951  Subjective/Objective:  Pt admitted with PE/DVT                   Action/Plan: PTA pt lived at home, plan to start Eliquis- per insurance check- copay $23/mo- spoke with pt at bedside- coverage info shared- 30 day free card given to pt to use on discharge- per pt she uses [facility] on [address].   Expected Discharge Date:                  Expected Discharge Plan:  Home/Self Care  In-House Referral:     Discharge planning Services  CM Consult, Medication Assistance  Post Acute Care Choice:  NA Choice offered to:  NA  DME Arranged:    DME Agency:     HH Arranged:    [place] Agency:     Status of Service:  Completed, signed off  If discussed at [employer] of Stay Meetings, dates discussed:    Additional Comments:  Dawayne Patricia, RN 05/27/2016, 3:43 PM

## 2016-05-27 NOTE — Evaluation (Signed)
Physical Therapy Evaluation Patient Details Name: Laura Neal MRN: [id number] DOB: 05/26/1950 Today's Date: 05/27/2016   History of Present Illness  66 y.o. female presenting with shortness of breath and chest pain and found to have pulmonary embolism . 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  Clinical Impression  Pt mobilizing well during PT session and reporting that she feels confident with D/C to home with family support following her hospital stay. Pt has her mother and sisters who are able to assist her as needed. PT to follow acutely to assist with transition to home.     Follow Up Recommendations Supervision for mobility/OOB    Equipment Recommendations  None recommended by PT    Recommendations for Other Services       Precautions / Restrictions Precautions Precautions: Fall Precaution Comments: pt reports she had R anterior THA in January 2018 Restrictions Weight Bearing Restrictions: No      Mobility  Bed Mobility Overal bed mobility: Needs Assistance Bed Mobility: Supine to Sit     Supine to sit: Supervision     General bed mobility comments: HOB slightly elevated with use of bed rail and increased time  Transfers Overall transfer level: Needs assistance Equipment used: Rolling walker (2 wheeled) Transfers: Sit to/from Stand Sit to Stand: Min guard         General transfer comment: Min guard for initial sit to stand from EOB. Good hand placement and technique with RW  Ambulation/Gait Ambulation/Gait assistance: Supervision Ambulation Distance (Feet): 160 Feet Assistive device: Rolling walker (2 wheeled) Gait Pattern/deviations: WFL(Within Functional Limits);Decreased stance time - right Gait velocity: decreased   General Gait Details: Mild antalgic pattern through Rt LE, no  loss of balance.   Stairs Stairs:  (pt declined, reports feeling confident to perform at home. )          Wheelchair Mobility    Modified Rankin (Stroke Patients Only)       Balance Overall balance assessment: No apparent balance deficits (not formally assessed)                                           Pertinent Vitals/Pain Pain Assessment: Faces Faces Pain Scale: Hurts a little bit Pain Location: Rt LE Pain Descriptors / Indicators: Sore Pain Intervention(s): Limited activity within patient's tolerance;Monitored during session    [place] expects to be discharged to:: Private residence Living Arrangements: Parent (mother) Available Help at Discharge: Family;Available 24 hours/day (sisters can assist if needed) Type of Home: House Home Access: Stairs to enter Entrance Stairs-Rails: Right Entrance Stairs-Number of Steps: 3 Home Layout: Two level;Bed/bath upstairs;Able to live on main level with bedroom/bathroom (can sleep in recliner on first floor) Home Equipment: Walker - 2 wheels;Cane - single point;Bedside commode      Prior Function Level of Independence: Independent with assistive device(s)         Comments: RW for mobility at home. Independent with BADL; occasional assist with LB dressing, only sponge bathing right now. Mother and sisters help with cooking and cleaning     Hand Dominance   Dominant Hand: Right    Extremity/Trunk Assessment   Upper Extremity Assessment Upper Extremity Assessment: Overall WFL for tasks assessed  Lower Extremity Assessment Lower Extremity Assessment: RLE deficits/detail RLE Deficits / Details: mild decreased strength but pt able to use functionally    Cervical / Trunk Assessment Cervical / Trunk Assessment: Normal  Communication   Communication: No difficulties  Cognition Arousal/Alertness: Awake/alert Behavior During Therapy: WFL for tasks assessed/performed Overall  Cognitive Status: Within Functional Limits for tasks assessed                      General Comments  Nursing reports pt may D/C to home later this afternoon. Cleared to participate with PT/OT.     Exercises     Assessment/Plan    PT Assessment Patient needs continued PT services  PT Problem List Decreased strength;Decreased activity tolerance;Decreased balance;Decreased mobility       PT Treatment Interventions DME instruction;Gait training;Stair training;Functional mobility training;Therapeutic activities;Therapeutic exercise;Patient/family education    PT Goals (Current goals can be found in the Care Plan section)  Acute Rehab PT Goals Patient Stated Goal: return home PT Goal Formulation: With patient Time For Goal Achievement: 06/10/16 Potential to Achieve Goals: Good    Frequency Min 3X/week   Barriers to discharge        Co-evaluation   Reason for Co-Treatment: To address functional/ADL transfers PT goals addressed during session: Mobility/safety with mobility OT goals addressed during session: ADL's and self-care       End of Session Equipment Utilized During Treatment: Gait belt Activity Tolerance: Patient tolerated treatment well Patient left: in chair;with call bell/phone within reach Nurse Communication: Mobility status PT Visit Diagnosis: Muscle weakness (generalized) (M62.81);Unsteadiness on feet (R26.81)         Time: [id number] PT Time Calculation (min) (ACUTE ONLY): 25 min   Charges:   PT Evaluation $PT Eval Moderate Complexity: 1 Procedure     PT G Codes:         Cassell Clement, PT, CSCS Pager [phone] Office [phone]  05/27/2016, 1:03 PM

## 2016-05-27 NOTE — Evaluation (Signed)
Occupational Therapy Evaluation Patient Details Name: Laura Neal MRN: [id number] DOB: September 07, 1950 Today's Date: 05/27/2016    History of Present Illness 66 y.o. female presenting with shortness of breath and chest pain and found to have pulmonary embolism . 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   Clinical Impression   Pt reports she required occasional assist for BADL PTA. Currently pt requires supervision for ADL and functional mobility with the exception of min assist for LB ADL. Pt reports she feels she has returned to her functional baseline since after hip sx in January. Pt planning to d/c home with assist from family as needed. No further acute OT needs identified; signing off at this time. Please re-consult if needs change. Thank you for this referral.    Follow Up Recommendations  No OT follow up;Supervision - Intermittent    Equipment Recommendations  None recommended by OT    Recommendations for Other Services       Precautions / Restrictions Precautions Precautions: Fall Precaution Comments: pt reports she had R anterior THA in January 2018 Restrictions Weight Bearing Restrictions: No      Mobility Bed Mobility Overal bed mobility: Needs Assistance Bed Mobility: Supine to Sit     Supine to sit: Supervision     General bed mobility comments: HOB slightly elevated with use of bed rail and increased time  Transfers Overall transfer level: Needs assistance Equipment used: Rolling walker (2 wheeled) Transfers: Sit to/from Stand Sit to Stand: Min guard         General transfer comment: Min guard for initial sit to stand from EOB. Good hand placement and technique with RW    Balance Overall balance assessment: No apparent balance deficits (not formally assessed)                                           ADL Overall ADL's : Needs assistance/impaired;At baseline Eating/Feeding: Sitting;Independent   Grooming: Supervision/safety;Standing;Wash/dry hands   Upper Body Bathing: Set up;Supervision/ safety;Sitting   Lower Body Bathing: Minimal assistance;Sit to/from stand   Upper Body Dressing : Set up;Supervision/safety;Sitting   Lower Body Dressing: Minimal assistance;Sit to/from stand   Toilet Transfer: Supervision/safety;Ambulation;RW Toilet Transfer Details (indicate cue type and reason): Simulated by sit to stand from EOB with functional mobility       Tub/Shower Transfer Details (indicate cue type and reason): Pt only sponge bathing at this time. Verbalized understanding of 3 in 1 in shower as a seat if needed Functional mobility during ADLs: Supervision/safety;Rolling walker       Vision         Perception     Praxis      Pertinent Vitals/Pain Pain Assessment: No/denies pain     Hand Dominance Right   Extremity/Trunk Assessment Upper Extremity Assessment Upper Extremity Assessment: Overall WFL for tasks assessed   Lower Extremity Assessment Lower Extremity Assessment: Defer to PT evaluation   Cervical / Trunk Assessment Cervical / Trunk Assessment: Normal   Communication Communication Communication: No difficulties   Cognition Arousal/Alertness: Awake/alert Behavior During Therapy: WFL for tasks assessed/performed Overall Cognitive Status: Within Functional Limits for tasks assessed  General Comments       Exercises       Shoulder Instructions      Home Living Family/patient expects to be discharged to:: Private residence Living Arrangements: Parent (mother) Available Help at Discharge: Family;Available 24 hours/day (sisters can assist if needed) Type of Home: House Home Access: Stairs to enter [employer] of Steps: 3 Entrance Stairs-Rails: Right Home Layout: Two  level;Bed/bath upstairs;Able to live on main level with bedroom/bathroom (can sleep in recliner on first floor)     Bathroom Shower/Tub: Tub/shower unit (walk in on second floor) Shower/tub characteristics: Curtain Biochemist, clinical: Standard     Home Equipment: Environmental consultant - 2 wheels;Cane - single point;Bedside commode          Prior Functioning/Environment Level of Independence: Independent with assistive device(s)        Comments: RW for mobility at home. Independent with BADL; occasional assist with LB dressing, only sponge bathing right now. Mother and sisters help with cooking and cleaning        OT Problem List:        OT Treatment/Interventions:      OT Goals(Current goals can be found in the care plan section) Acute Rehab OT Goals Patient Stated Goal: return home OT Goal Formulation: All assessment and education complete, DC therapy  OT Frequency:     Barriers to D/C:            Co-evaluation PT/OT/SLP Co-Evaluation/Treatment: Yes Reason for Co-Treatment: To address functional/ADL transfers   OT goals addressed during session: ADL's and self-care      End of Session Equipment Utilized During Treatment: Gait belt;Rolling walker Nurse Communication: Mobility status  Activity Tolerance: Patient tolerated treatment well Patient left: in chair;with call bell/phone within reach  OT Visit Diagnosis: Unsteadiness on feet (R26.81)                ADL either performed or assessed with clinical judgement  Time: [id number] OT Time Calculation (min): 19 min Charges:  OT General Charges $OT Visit: 1 Procedure OT Evaluation $OT Eval Moderate Complexity: 1 Procedure G-Codes:     Jazalynn Mireles A. Ulice Brilliant, M.S., OTR/L Pager: [place] 05/27/2016, 12:01 PM

## 2016-05-27 NOTE — Progress Notes (Addendum)
Nutrition Brief Note  Patient identified on the Malnutrition Screening Tool (MST) Report.   Wt Readings from Last 15 Encounters:  05/27/16 205 lb (93 kg)  05/20/16 205 lb 8 oz (93.2 kg)  04/22/16 216 lb (98 kg)  04/17/16 216 lb (98 kg)  04/09/16 214 lb 11.2 oz (97.4 kg)  03/05/16 216 lb (98 kg)  01/30/16 216 lb 4.8 oz (98.1 kg)  12/31/15 214 lb 3.2 oz (97.2 kg)  11/28/15 215 lb 8 oz (97.8 kg)  10/24/15 219 lb 6.4 oz (99.5 kg)  09/24/15 219 lb 6.4 oz (99.5 kg)  08/28/15 222 lb 14.4 oz (101.1 kg)  07/30/15 227 lb 1.6 oz (103 kg)  06/27/15 234 lb 12.8 oz (106.5 kg)  12/09/14 241 lb (109.3 kg)   Body mass index is 31.17 kg/m. Patient meets criteria for Obesity Class I based on current BMI.   Current diet order is Heart Healthy/Carbohydrate Modified, patient is consuming approximately 75% of meals at this time.  Labs and medications reviewed.   No nutrition interventions warranted at this time. If nutrition issues arise, please consult RD.   Arthur Holms, RD, LDN Pager #: [phone] After-Hours Pager #: [phone]

## 2016-05-27 NOTE — Discharge Summary (Signed)
[facility] Discharge Summary  Patient name: DONNA-MARIE CHEAK Medical record number: [id number] Date of birth: Jan 25, 1951 Age: 66 y.o. Gender: female Date of Admission: 05/26/2016  Date of Discharge: 05/27/2016 Admitting Physician: Lupita Dawn, MD  Primary Care Provider: Phineas Inches, MD Consultants: None  Indication for Hospitalization: PE, DVT  Discharge Diagnoses/Problem List:  Patient Active Problem List   Diagnosis Date Noted  . Chest pain   . Shortness of breath   . Renal cell carcinoma of right kidney ([place])   . 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 Pulmonary embolus ([place]) 05/26/2016  . OA (osteoarthritis) of hip 04/22/2016  . Type 2 diabetes mellitus with stage 3 chronic kidney disease, without long-term current use of insulin ([place]) 12/09/2014    Disposition: Home  Discharge Condition: Stable  Discharge Exam:  Temp:  [98.2 F (36.8 C)-98.6 F (37 C)] 98.2 F (36.8 C) (02/28 0505) Pulse Rate:  [84-98] 84 (02/28 1349) Resp:  [18-23] 20 (02/28 1349) BP: (94-129)/(60-77) 94/62 (02/28 1349) SpO2:  [92 %-97 %] 95 % (02/28 1349) Weight:  [93 kg (205 lb)] 93 kg (205 lb) (02/28 0925) Physical Exam: General: NAD, rests comfortably Cardiovascular: RRR, no m/r/g Respiratory: CTA bil, no W/R/R, +TTP over left chest wall Abdomen: soft, nontender, nondistended Extremities: +L homan sign without cords, no LE edema or erythema  Brief Hospital Course:  66 year old patient with a history of renal cell carcinoma In her right kidney with lung and adrenal metastases, type 2 diabetes, and hip OA status post RTHR on 04/22/16 was admitted to our service with shortness of breath and chest pain.  She was found to have high probability of PE on VQ scan. Lower extremity Dopplers were positive for DVTs in the left lower extremity. The patient was started on heparin initially, as there is some history of recent lower GI bleed. However, this bleed was  thought to be minimal and anticoagulation was considered safe. The patient was transitioned to Eliquis prior to discharge.  Other workup for shortness of breath and chest pain was negative including EKG which was nonischemic, troponins which were negative, BMP which was within normal limits. Chest x-ray showed small left basal pleural effusion.  The patient was considered safe for discharge on Eliquis with close follow-up by her PCP. Patient was also seen by OT/PT and considered safe for discharge with intermittent supervision which she has available at home with her family.  Issues for Follow Up:  1. Blood Pressure HCTZ- Losartan doses were continued at half doses at discharge as her pressure were well controlled at this dose. Please follow up blood pressures at hospital follow up. 2. PE - Patient was discharged on therapeutic Eliquis for provoked PE and DVT.  She is to take 10 mg BID for one week (Through 3/6) and then transition to 5 mg Eliquis BID starting 3/7 to continue (most likely will need lifelong anticoagulation) 3. Cardiac Echo  - was ordered prior to discharge to rule out patent foramen ovale which would put the patient increased risk of stroke. Please follow-up the results of this test at hospital follow-up. 4. Risk factors for PE -  Malignancy. Megace was discontinued. There is some question by pharmacists about Voltrien chemotherapeutic agent and risk for provoking PE. Patient's oncologist was curb-sided and felt it was safe to continue this medication.  Significant Procedures: None  Significant Labs and Imaging:   Recent Labs Lab 05/26/16 1145 05/27/16 0631  WBC 9.0 8.2  HGB 11.5* 10.8*  HCT 36.7 35.0*  PLT 480* 412*    Recent Labs Lab 05/26/16 1300 05/27/16 0631  NA 138 138  K 4.0 3.5  CL 104 105  CO2 22 22  GLUCOSE 91 100*  BUN 22* 22*  CREATININE 1.82* 1.61*  CALCIUM 8.8* 8.5*  ALKPHOS 74  --   AST 24  --   ALT 11*  --   ALBUMIN 2.8*  --    Lower  Extremity U/S 2/27 Summary: There is evidence of acute deep vein thrombosis involving the posterior tibial, and peroneal veins of the left lower extremity.  There is no evidence of superficial vein thrombosis involving the left lower extremity. There is no evidence of deep or superficial vein thrombosis involving the right lower extremity. There is no evidence of a Baker&'s cyst bilaterally. Other specific details can be found in the table(s) above.  Dg Chest 2 View 05/26/2016 FINDINGS: The lungs are adequately inflated. The lung markings are coarse at the left lung base. A trace of pleural fluid is suspected on the left as well. There is subtle nodularity in the left suprahilar region. No definite abnormal nodules are seen elsewhere. The heart is normal in size. There is mild soft tissue fullness in the AP window. There is calcification in the wall of the thoracic aorta. There is mild multilevel degenerative disc disease of the thoracic spine. There is subtle 1 cm nodular sclerotic density in the body of T7 or T8.  IMPRESSION:  - Known metastatic renal cell malignancy.  - Subtle nodularity in the left suprahilar region with AP window soft tissue prominence worrisome for lymphadenopathy.  - Increased density at the left lung base with small left pleural effusion may reflect atelectasis or pneumonia or less likely metastatic disease. Followup PA and lateral chest X-ray is recommended in 3-4 weeks following trial of antibiotic therapy to ensure resolution and exclude underlying malignancy.  - Thoracic aortic atherosclerosis.   Nm Pulmonary Perf And Vent: 05/26/2016 FINDINGS: Ventilation: No segmental or subsegmental ventilation defects. Some central deposition of the radiopharmaceutical. Perfusion: Multiple segmental and subsegmental perfusion defects consistent with pulmonary embolism.  IMPRESSION:  High probability ventilation perfusion lung scan for pulmonary embolism.   Results/Tests  Pending at Time of Discharge: Echo  Discharge Medications:  Allergies as of 05/27/2016      Reactions   Codeine Rash      Medication List    STOP taking these medications   losartan-hydrochlorothiazide 100-25 MG tablet Commonly known as:  HYZAAR   megestrol 400 MG/10ML suspension Commonly known as:  MEGACE   rivaroxaban 10 MG Tabs tablet Commonly known as:  XARELTO     TAKE these medications   acetaminophen 325 MG tablet Commonly known as:  TYLENOL Take 2 tablets (650 mg total) by mouth every 6 (six) hours as needed for mild pain (or Fever >/= 101).   apixaban 5 MG Tabs tablet Commonly known as:  ELIQUIS Take 2 tablets (10 mg total) by mouth 2 (two) times daily.   apixaban 5 MG Tabs tablet Commonly known as:  ELIQUIS Take 1 tablet (5 mg total) by mouth 2 (two) times daily. Start taking on:  06/03/2016   aspirin EC 81 MG tablet Take 81 mg by mouth daily.   bisacodyl 10 MG suppository Commonly known as:  DULCOLAX Place 1 suppository (10 mg total) rectally daily as needed for moderate constipation.   BYETTA 10 MCG PEN 10 MCG/0.04ML Sopn injection Generic drug:  exenatide Inject 10  mg into the skin 2 (two) times daily.   docusate sodium 100 MG capsule Commonly known as:  COLACE Take 1 capsule (100 mg total) by mouth 2 (two) times daily as needed for mild constipation.   fluticasone 50 MCG/ACT nasal spray Commonly known as:  FLONASE Place 2 sprays into the nose daily. What changed:  when to take this  reasons to take this   furosemide 20 MG tablet Commonly known as:  LASIX Take 20 mg by mouth daily as needed for fluid or edema.   hydrochlorothiazide 12.5 MG capsule Commonly known as:  MICROZIDE Take 1 capsule (12.5 mg total) by mouth daily.   insulin glargine 100 UNIT/ML injection Commonly known as:  LANTUS Inject 30 Units into the skin at bedtime.   loperamide 2 MG capsule Commonly known as:  IMODIUM Take 2 mg by mouth as needed for diarrhea or loose  stools (side effects of Votrient.).   losartan 50 MG tablet Commonly known as:  COZAAR Take 1 tablet (50 mg total) by mouth daily.   methocarbamol 500 MG tablet Commonly known as:  ROBAXIN Take 1 tablet (500 mg total) by mouth every 6 (six) hours as needed for muscle spasms.   oxyCODONE 5 MG immediate release tablet Commonly known as:  Oxy IR/ROXICODONE Take 1-2 tablets (5-10 mg total) by mouth every 4 (four) hours as needed for moderate pain or severe pain.   pazopanib 200 MG tablet Commonly known as:  VOTRIENT Take 3 tablets (600 mg total) by mouth daily. Take on an empty stomach.   polyethylene glycol packet Commonly known as:  MIRALAX / GLYCOLAX Take 17 g by mouth daily as needed for mild constipation.   prochlorperazine 10 MG tablet Commonly known as:  COMPAZINE Take 1 tablet (10 mg total) by mouth every 6 (six) hours as needed for nausea or vomiting.   rosuvastatin 10 MG tablet Commonly known as:  CRESTOR Take 10 mg by mouth at bedtime.   sodium phosphate 7-19 GM/118ML Enem Place 133 mLs (1 enema total) rectally once as needed for severe constipation.   traMADol 50 MG tablet Commonly known as:  ULTRAM Take 1-2 tablets (50-100 mg total) by mouth every 6 (six) hours as needed (mild pain).       Discharge Instructions: Please refer to Patient Instructions section of EMR for full details.  Patient was counseled important signs and symptoms that should prompt return to medical care, changes in medications, dietary instructions, activity restrictions, and follow up appointments.   Follow-Up Appointments: Follow-up Information    BOUSKA,DAVID E, MD. Schedule an appointment as soon as possible for a visit.   Specialty:  Family Medicine Why:  Please call and make a follow-up appointment to be seen by your primary care doctor within 1 week. Contact information: [address] [place] [address] [phone]           Everrett Coombe, MD 05/28/2016, 3:42 PM PGY-1, [place]

## 2016-05-27 NOTE — Progress Notes (Signed)
ANTICOAGULATION CONSULT NOTE - Follow Up Consult  Pharmacy Consult for heparin Indication: pulmonary embolus  Labs:  Recent Labs  05/26/16 1145 05/26/16 1300 05/26/16 1832 05/26/16 2354  HGB 11.5*  --   --   --   HCT 36.7  --   --   --   PLT 480*  --   --   --   HEPARINUNFRC  --   --   --  1.04*  CREATININE  --  1.82*  --   --   TROPONINI  --   --  0.03* 0.04*    Assessment: 66yo female above goal on heparin with initial dosing for PE; no signs of bleeding per RN; of note pt was on Xarelto until two weeks ago but would not expect Xarelto to still be affecting anti-Xa levels, there may still be some effect from bolus though.  Goal of Therapy:  Heparin level 0.3-0.7 units/ml   Plan:  Will decrease heparin gtt by 3 units/kgABW/hr to 1100 units/hr and check level in [place], PharmD, BCPS  05/27/2016,1:29 AM

## 2016-06-02 ENCOUNTER — Encounter: Payer: Self-pay | Admitting: Physician Assistant

## 2016-06-05 ENCOUNTER — Other Ambulatory Visit: Payer: Self-pay | Admitting: *Deleted

## 2016-06-05 DIAGNOSIS — C649 Malignant neoplasm of unspecified kidney, except renal pelvis: Secondary | ICD-10-CM

## 2016-06-05 MED ORDER — PAZOPANIB HCL 200 MG PO TABS: 600.0000 mg | ORAL_TABLET | Freq: Every day | ORAL | 0 refills | Status: DC

## 2016-06-05 MED FILL — VOTRIENT 200 MG TABLET: 200 | 30 days supply | Qty: 90 | Fill #0

## 2016-06-16 ENCOUNTER — Ambulatory Visit (INDEPENDENT_AMBULATORY_CARE_PROVIDER_SITE_OTHER): Payer: Medicare Other | Admitting: Physician Assistant

## 2016-06-16 ENCOUNTER — Encounter: Payer: Self-pay | Admitting: Physician Assistant

## 2016-06-16 ENCOUNTER — Other Ambulatory Visit (INDEPENDENT_AMBULATORY_CARE_PROVIDER_SITE_OTHER): Payer: Medicare Other

## 2016-06-16 VITALS — BP 120/78 | HR 88 | Ht 68.0 in | Wt 200.0 lb

## 2016-06-16 DIAGNOSIS — D649 Anemia, unspecified: Secondary | ICD-10-CM | POA: Diagnosis not present

## 2016-06-16 DIAGNOSIS — K921 Melena: Secondary | ICD-10-CM

## 2016-06-16 DIAGNOSIS — C649 Malignant neoplasm of unspecified kidney, except renal pelvis: Secondary | ICD-10-CM

## 2016-06-16 DIAGNOSIS — Z96641 Presence of right artificial hip joint: Secondary | ICD-10-CM

## 2016-06-16 DIAGNOSIS — R195 Other fecal abnormalities: Secondary | ICD-10-CM

## 2016-06-16 DIAGNOSIS — Z860101 Personal history of adenomatous and serrated colon polyps: Secondary | ICD-10-CM | POA: Insufficient documentation

## 2016-06-16 DIAGNOSIS — Z86010 Personal history of colonic polyps: Secondary | ICD-10-CM

## 2016-06-16 LAB — CBC WITH DIFFERENTIAL/PLATELET
Basophils Absolute: 0 10*3/uL (ref 0.0–0.1)
Basophils Relative: 0.4 % (ref 0.0–3.0)
EOS PCT: 0.8 % (ref 0.0–5.0)
Eosinophils Absolute: 0.1 10*3/uL (ref 0.0–0.7)
HEMATOCRIT: 34.5 % — AB (ref 36.0–46.0)
HEMOGLOBIN: 10.8 g/dL — AB (ref 12.0–15.0)
LYMPHS PCT: 30.2 % (ref 12.0–46.0)
Lymphs Abs: 3.3 10*3/uL (ref 0.7–4.0)
MCHC: 31.2 g/dL (ref 30.0–36.0)
MCV: 95.7 fl (ref 78.0–100.0)
MONO ABS: 0.6 10*3/uL (ref 0.1–1.0)
MONOS PCT: 5.9 % (ref 3.0–12.0)
Neutro Abs: 6.9 10*3/uL (ref 1.4–7.7)
Neutrophils Relative %: 62.7 % (ref 43.0–77.0)
Platelets: 433 10*3/uL — ABNORMAL HIGH (ref 150.0–400.0)
RBC: 3.61 Mil/uL — AB (ref 3.87–5.11)
RDW: 21.4 % — ABNORMAL HIGH (ref 11.5–15.5)
WBC: 11 10*3/uL — AB (ref 4.0–10.5)

## 2016-06-16 MED ORDER — PANTOPRAZOLE SODIUM 40 MG PO TBEC: 40.0000 mg | DELAYED_RELEASE_TABLET | Freq: Every day | ORAL | 3 refills | Status: AC

## 2016-06-16 NOTE — Progress Notes (Signed)
Thank you for sending this case to me. I have reviewed the entire note, and the outlined plan seems appropriate.  I can do the EGD while she is on Eliquis.  Wilfrid Lund, MD

## 2016-06-16 NOTE — Progress Notes (Signed)
Subjective:    Patient ID: Laura Neal, female    DOB: [birth year], 66 y.o.   MRN: [id number]  HPI  Aviela  is a very nice 66 year old African-American female referred today by Dr. Coletta Memos . She is known previously to Dr. Deatra Ina and was last seen here in 2013 for colonoscopy. She has history of adenomatous colon polyps but at colonoscopy in 2013 no polyps were found, she was noted to have mild diverticulosis. 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 She is undergoing treatment with Votrient under Dr. Hazeline Junker care. She underwent right hip replacement in January 2018, was on anticoagulation briefly postoperatively. She says she noticed a black somewhat tarry stool the day prior to hip surgery and also had dark stool for couple of days after the hip surgery but then this resolved. She was then diagnosed with a DVT and PE on 05/26/2016 and is now on Eliquis and aspirin.. She says she started noticing dark stool again around March 5 and had a couple of episodes over the next day or 2 and then her stools returned to normal and says that her bowels have been appearing normal since. She has no specific complaints of heartburn or indigestion, Some mid abdominal pain off and on but says this is been fairly long-term Bowel habits have been on the looser side but she attributes this to her chemotherapy. Most recent labs done on 05/27/2016 hemoglobin 10.8 hematocrit of 35, MCV of 96. 04/26/16 hemoglobin was 9.1 hematocrit of 28 and postop after her hip 04/24/16  hemoglobin was 7.8 hematocrit of 23.8.   Review of Systems Pertinent positive and negative review of systems were noted in the above HPI section.  All other review of systems was otherwise negative.  Outpatient Encounter Prescriptions as of 06/16/2016  Medication Sig  . acetaminophen (TYLENOL) 325 MG tablet Take 2 tablets (650 mg total) by mouth every 6 (six) hours as needed for  mild pain (or Fever >/= 101).  [name] apixaban (ELIQUIS) 5 MG TABS tablet Take 1 tablet (5 mg total) by mouth 2 (two) times daily.  [name] aspirin EC 81 MG tablet Take 81 mg by mouth daily.  [name] BYETTA 10 MCG PEN 10 MCG/0.04ML SOPN injection Inject 10 mg into the skin 2 (two) times daily.  . fluticasone (FLONASE) 50 MCG/ACT nasal spray Place 2 sprays into the nose daily. (Patient taking differently: Place 2 sprays into the nose daily as needed for allergies. )  . furosemide (LASIX) 20 MG tablet Take 20 mg by mouth daily as needed for fluid or edema.   . hydrochlorothiazide (MICROZIDE) 12.5 MG capsule Take 1 capsule (12.5 mg total) by mouth daily.  . insulin glargine (LANTUS) 100 UNIT/ML injection Inject 30 Units into the skin at bedtime.   [name] loperamide (IMODIUM) 2 MG capsule Take 2 mg by mouth as needed for diarrhea or loose stools (side effects of Votrient.).  [name] losartan (COZAAR) 50 MG tablet Take 1 tablet (50 mg total) by mouth daily.  . methocarbamol (ROBAXIN) 500 MG tablet Take 1 tablet (500 mg total) by mouth every 6 (six) hours as needed for muscle spasms.  . pazopanib (VOTRIENT) 200 MG tablet Take 3 tablets (600 mg total) by mouth daily. Take on an empty stomach.  . prochlorperazine (COMPAZINE) 10 MG tablet Take 1 tablet (10 mg total) by mouth every 6 (six) hours as needed for nausea or vomiting.  . rosuvastatin (CRESTOR) 10 MG tablet  Take 10 mg by mouth at bedtime.   . traMADol (ULTRAM) 50 MG tablet Take 1-2 tablets (50-100 mg total) by mouth every 6 (six) hours as needed (mild pain).  . pantoprazole (PROTONIX) 40 MG tablet Take 1 tablet (40 mg total) by mouth daily.  . [DISCONTINUED] apixaban (ELIQUIS) 5 MG TABS tablet Take 2 tablets (10 mg total) by mouth 2 (two) times daily.  . [DISCONTINUED] bisacodyl (DULCOLAX) 10 MG suppository Place 1 suppository (10 mg total) rectally daily as needed for moderate constipation.  . [DISCONTINUED] docusate sodium (COLACE) 100 MG capsule Take 1 capsule (100 mg  total) by mouth 2 (two) times daily as needed for mild constipation. (Patient not taking: Reported on 05/26/2016)  . [DISCONTINUED] oxyCODONE (OXY IR/ROXICODONE) 5 MG immediate release tablet Take 1-2 tablets (5-10 mg total) by mouth every 4 (four) hours as needed for moderate pain or severe pain.  . [DISCONTINUED] polyethylene glycol (MIRALAX / GLYCOLAX) packet Take 17 g by mouth daily as needed for mild constipation.  . [DISCONTINUED] sodium phosphate (FLEET) 7-19 GM/118ML ENEM Place 133 mLs (1 enema total) rectally once as needed for severe constipation.   No facility-administered encounter medications on file as of 06/16/2016.    Allergies  Allergen Reactions  . Codeine Rash   Patient Active Problem List   Diagnosis Date Noted  . S/P hip replacement, right 06/16/2016  . Chest pain   . Shortness of breath   . Renal cell carcinoma of right kidney ([place])   . Malignant neoplasm metastatic to left lung ([place])   . Pulmonary embolus ([place]) 05/26/2016  . OA (osteoarthritis) of hip 04/22/2016  . Type 2 diabetes mellitus with stage 3 chronic kidney disease, without long-term current use of insulin ([place]) 12/09/2014   Social History   Social History  . Marital status: Widowed    Spouse name: N/A  . Number of children: N/A  . Years of education: N/A   Occupational History  . Not on file.   Social History Main Topics  . Smoking status: Former Smoker    Packs/day: 0.50    Years: 40.00    Types: Cigarettes    Quit date: 03/30/2004  . Smokeless tobacco: Former Systems developer    Types: Chew, [place]: 05/26/2016 "tried chew and snuff when I was young"  . Alcohol use No  . Drug use: No  . Sexual activity: No   Other Topics Concern  . Not on file   Social History Narrative   ** Merged History Encounter **        Ms. Tetrault's family history includes Cancer in her sister; Diabetes in her brother and sister; Hypertension in her brother, father, and mother.      Objective:    Vitals:    06/16/16 1100  BP: 120/78  Pulse: 88    Physical Exam    well-developed older African-American female in no acute distress, pleasant accompanied by her sister. Patient ambulating with a walker. Blood pressure 120/78 pulse 88, height 5 foot 8, weight 200, BMI 30.4. HEENT ;nontraumatic normocephalic EOMI PERRLA sclera anicteric, Cardiovascular; regular rate and rhythm with S1-S2 no murmur or gallop, Pulmonary; clear bilaterally, Abdomen; soft BS sounds are present ,no definite palpable mass or hepatosplenomegaly and no focal tenderness bowel sounds are present, Recta;exam stool is brown and Hemoccult positive, Extremities ;status post recent right hip replacement, no edema, Neuropsych; mood and affect appropriate       Assessment & Plan:   #95 66 year old African-American  female with metastatic renal cell cancer with left renal mass and metastatic disease to the lungs who is referred with history of intermittent black stools in the setting of chronic anticoagulation.-Stool documented brown and Hemoccult positive today Etiology of her intermittent bleeding is not clear, rule out gastropathy, peptic ulcer disease, metastatic disease to the bowel. #2 very recent DVT/PE diagnosed 05/26/2016-on eliquis #3 status post right hip replacement January 2018 #4 adult-onset diabetes mellitus #5 remote history of adenomatous colon polyps and previously documented diverticulosis, last colonoscopy February 2013 #6 anemia multifactorial  Plan; repeat CBC today Start Protonix 40 mg by mouth every morning Patient had previously been scheduled for staging CT of the chest abdomen and pelvis for tomorrow 06/17/2016. These will need to be reviewed for any evidence of progression of her malignancy and/or involvement of the bowel Will schedule for EGD with Dr. Loletha Carrow , diagnostic with patient to remain on eliquis. Procedure discussed in detail with patient including risks and benefits and she is agreeable to  proceed. This can be canceled if CT scan shows other concerning findings. Patient is asked to continue to monitor her stools and to notify us immediately if she has recurrence of black stools. She's also advised that if she has a succession of  black bowel movements to  proceed to the emergency room.     Laveta Gilkey S Seif Teichert PA-C 06/16/2016   Cc: Bernerd Limbo, MD

## 2016-06-16 NOTE — Patient Instructions (Signed)
Your physician has requested that you go to the basement for the following lab work before leaving today:  CBC  We have sent the following medications to your pharmacy for you to pick up at your convenience: Protonix  You have been scheduled for an endoscopy. Please follow written instructions given to you at your visit today. If you use inhalers (even only as needed), please bring them with you on the day of your procedure. Your physician has requested that you go to www.startemmi.com and enter the access code given to you at your visit today. This web site gives a general overview about your procedure. However, you should still follow specific instructions given to you by our office regarding your preparation for the procedure.

## 2016-06-17 ENCOUNTER — Encounter (HOSPITAL_COMMUNITY): Payer: Self-pay

## 2016-06-17 ENCOUNTER — Ambulatory Visit (HOSPITAL_COMMUNITY)
Admission: RE | Admit: 2016-06-17 | Discharge: 2016-06-17 | Disposition: A | Discharge status: Home / Self Care | Payer: Medicare Other | Source: Ambulatory Visit | Attending: Oncology | Admitting: Oncology

## 2016-06-17 ENCOUNTER — Other Ambulatory Visit: Payer: Medicare Other

## 2016-06-17 DIAGNOSIS — J90 Pleural effusion, not elsewhere classified: Secondary | ICD-10-CM | POA: Insufficient documentation

## 2016-06-17 DIAGNOSIS — C799 Secondary malignant neoplasm of unspecified site: Secondary | ICD-10-CM | POA: Insufficient documentation

## 2016-06-17 DIAGNOSIS — C649 Malignant neoplasm of unspecified kidney, except renal pelvis: Secondary | ICD-10-CM | POA: Diagnosis not present

## 2016-06-17 DIAGNOSIS — E279 Disorder of adrenal gland, unspecified: Secondary | ICD-10-CM | POA: Insufficient documentation

## 2016-06-17 MED ORDER — IOPAMIDOL (ISOVUE-300) INJECTION 61%
INTRAVENOUS | Status: AC
  Filled 2016-06-17: qty 100

## 2016-06-17 MED ORDER — IOPAMIDOL (ISOVUE-300) INJECTION 61%
100.0000 mL | Freq: Once | INTRAVENOUS | Status: AC | PRN
  Administered 2016-06-17: 80 mL via INTRAVENOUS

## 2016-06-24 ENCOUNTER — Telehealth: Payer: Self-pay | Admitting: Oncology

## 2016-06-24 ENCOUNTER — Ambulatory Visit (HOSPITAL_BASED_OUTPATIENT_CLINIC_OR_DEPARTMENT_OTHER): Payer: Medicare Other | Admitting: Oncology

## 2016-06-24 ENCOUNTER — Other Ambulatory Visit (HOSPITAL_BASED_OUTPATIENT_CLINIC_OR_DEPARTMENT_OTHER): Payer: Medicare Other

## 2016-06-24 VITALS — BP 130/74 | HR 131 | Temp 97.1°F | Resp 18 | Ht 68.0 in | Wt 196.2 lb

## 2016-06-24 DIAGNOSIS — C642 Malignant neoplasm of left kidney, except renal pelvis: Secondary | ICD-10-CM

## 2016-06-24 DIAGNOSIS — C7800 Secondary malignant neoplasm of unspecified lung: Secondary | ICD-10-CM

## 2016-06-24 DIAGNOSIS — C7970 Secondary malignant neoplasm of unspecified adrenal gland: Secondary | ICD-10-CM

## 2016-06-24 DIAGNOSIS — C649 Malignant neoplasm of unspecified kidney, except renal pelvis: Secondary | ICD-10-CM

## 2016-06-24 DIAGNOSIS — N289 Disorder of kidney and ureter, unspecified: Secondary | ICD-10-CM

## 2016-06-24 DIAGNOSIS — R634 Abnormal weight loss: Secondary | ICD-10-CM

## 2016-06-24 DIAGNOSIS — C641 Malignant neoplasm of right kidney, except renal pelvis: Secondary | ICD-10-CM

## 2016-06-24 DIAGNOSIS — L271 Localized skin eruption due to drugs and medicaments taken internally: Secondary | ICD-10-CM

## 2016-06-24 DIAGNOSIS — R630 Anorexia: Secondary | ICD-10-CM

## 2016-06-24 LAB — COMPREHENSIVE METABOLIC PANEL
ALT: 13 U/L (ref 0–55)
ANION GAP: 15 meq/L — AB (ref 3–11)
AST: 17 U/L (ref 5–34)
Albumin: 2.9 g/dL — ABNORMAL LOW (ref 3.5–5.0)
Alkaline Phosphatase: 107 U/L (ref 40–150)
BILIRUBIN TOTAL: 0.6 mg/dL (ref 0.20–1.20)
BUN: 13.7 mg/dL (ref 7.0–26.0)
CO2: 19 meq/L — AB (ref 22–29)
CREATININE: 2 mg/dL — AB (ref 0.6–1.1)
Calcium: 9.5 mg/dL (ref 8.4–10.4)
Chloride: 107 mEq/L (ref 98–109)
EGFR: 30 mL/min/{1.73_m2} — ABNORMAL LOW (ref >90)
GLUCOSE: 239 mg/dL — AB (ref 70–140)
Potassium: 3.6 mEq/L (ref 3.5–5.1)
SODIUM: 140 meq/L (ref 136–145)
TOTAL PROTEIN: 7.3 g/dL (ref 6.4–8.3)

## 2016-06-24 LAB — CBC WITH DIFFERENTIAL/PLATELET
BASO%: 0.1 % (ref 0.0–2.0)
Basophils Absolute: 0 10*3/uL (ref 0.0–0.1)
EOS%: 0.3 % (ref 0.0–7.0)
Eosinophils Absolute: 0 10*3/uL (ref 0.0–0.5)
HCT: 36.7 % (ref 34.8–46.6)
HGB: 11.1 g/dL — ABNORMAL LOW (ref 11.6–15.9)
LYMPH%: 20.2 % (ref 14.0–49.7)
MCH: 29.1 pg (ref 25.1–34.0)
MCHC: 30.2 g/dL — ABNORMAL LOW (ref 31.5–36.0)
MCV: 96.3 fL (ref 79.5–101.0)
MONO#: 0.4 10*3/uL (ref 0.1–0.9)
MONO%: 4.9 % (ref 0.0–14.0)
NEUT%: 74.5 % (ref 38.4–76.8)
NEUTROS ABS: 5.6 10*3/uL (ref 1.5–6.5)
PLATELETS: 422 10*3/uL — AB (ref 145–400)
RBC: 3.81 10*6/uL (ref 3.70–5.45)
RDW: 19.2 % — ABNORMAL HIGH (ref 11.2–14.5)
WBC: 7.5 10*3/uL (ref 3.9–10.3)
lymph#: 1.5 10*3/uL (ref 0.9–3.3)

## 2016-06-24 NOTE — Telephone Encounter (Signed)
Lab and follow up with Dr Alen Blew scheduled  For 07/28/16, per 06/24/16 los. Patient was given a copy of the AVS report and appointment schedule per 06/24/16 los.

## 2016-06-24 NOTE — Progress Notes (Signed)
Hematology and Oncology Follow Up Visit  BINTOU LAFATA [id number] 11-Jan-1951 66 y.o. 06/24/2016 1:18 PM [name], MD   Principle Diagnosis: 66 year old woman with renal cell carcinoma. She presented with a 9.5 x 7.9 x 8.5 cm mass in the left kidney in March 2017. PET CT scan showed metastatic disease to the lung as well as the adrenal glands. This is biopsy proven to be renal cell carcinoma.   Prior Therapy: She is status post biopsy of her kidney mass which confirmed the presence of renal cell carcinoma on 07/04/2015.  Current therapy: Votrient 800 mg daily started on 07/16/2015. The dose will be reduced to 600 mg starting on 12/31/2015. The dose was reduced again to 400 mg daily on 06/24/2016.  Interim History: Ms. Nied presents today for a follow-up visit. Since the last visit, she was hospitalized in February 2018 after diagnosis of a deep vein thrombosis and pulmonary embolism. She was started on Eliquis and was discharged on 05/27/2016. She had Dopplers that showed DVT in the left lower extremity and high probability VQ scan confirmed a PE.  Since her discharge, she has been doing poorly at times. She is quite debilitated with symptoms of fatigue, tiredness and weight loss. She has poor by mouth intake and mild diarrhea. Some of the symptoms are attributed to Votrient given at the 600 mg dose.  She denied any recent falls or syncope. She denied any other fractures. She still ambulating with the help of a walker and a cane without any issues.  She does not report any headaches, blurry vision, syncope or seizures. She does not report any fevers, chills, sweats or decline in her energy. She does not report any chest pain, palpitation, orthopnea or leg edema. She does not report any cough, wheezing or hemoptysis. She does not report any nausea, vomiting, abdominal pain, hematochezia or melena. She does not report any frequency, urgency or hesitancy. She does not report any  arthralgias or myalgias. She does not report any lymphadenopathy or petechiae. Remaining review of systems unremarkable.    Medications: I have reviewed the patient's current medications.  Current Outpatient Prescriptions  Medication Sig Dispense Refill  . acetaminophen (TYLENOL) 325 MG tablet Take 2 tablets (650 mg total) by mouth every 6 (six) hours as needed for mild pain (or Fever >/= 101). 10 tablet 0  . apixaban (ELIQUIS) 5 MG TABS tablet Take 1 tablet (5 mg total) by mouth 2 (two) times daily. 120 tablet 0  . aspirin EC 81 MG tablet Take 81 mg by mouth daily.    [name] BYETTA 10 MCG PEN 10 MCG/0.04ML SOPN injection Inject 10 mg into the skin 2 (two) times daily.    . fluticasone (FLONASE) 50 MCG/ACT nasal spray Place 2 sprays into the nose daily. (Patient taking differently: Place 2 sprays into the nose daily as needed for allergies. ) 16 g 6  . furosemide (LASIX) 20 MG tablet Take 20 mg by mouth daily as needed for fluid or edema.     . hydrochlorothiazide (MICROZIDE) 12.5 MG capsule Take 1 capsule (12.5 mg total) by mouth daily. 30 capsule 0  . insulin glargine (LANTUS) 100 UNIT/ML injection Inject 30 Units into the skin at bedtime.     [name] loperamide (IMODIUM) 2 MG capsule Take 2 mg by mouth as needed for diarrhea or loose stools (side effects of Votrient.).    [name] losartan (COZAAR) 50 MG tablet Take 1 tablet (50 mg total) by mouth daily. 30 tablet 0  .  methocarbamol (ROBAXIN) 500 MG tablet Take 1 tablet (500 mg total) by mouth every 6 (six) hours as needed for muscle spasms. 80 tablet 0  . pantoprazole (PROTONIX) 40 MG tablet Take 1 tablet (40 mg total) by mouth daily. 30 tablet 3  . pazopanib (VOTRIENT) 200 MG tablet Take 3 tablets (600 mg total) by mouth daily. Take on an empty stomach. 90 tablet 0  . prochlorperazine (COMPAZINE) 10 MG tablet Take 1 tablet (10 mg total) by mouth every 6 (six) hours as needed for nausea or vomiting. 30 tablet 1  . rosuvastatin (CRESTOR) 10 MG tablet Take 10 mg  by mouth at bedtime.     . traMADol (ULTRAM) 50 MG tablet Take 1-2 tablets (50-100 mg total) by mouth every 6 (six) hours as needed (mild pain). 56 tablet 0   No current facility-administered medications for this visit.      Allergies:  Allergies  Allergen Reactions  . Codeine Rash    Past Medical History, Surgical history, Social history, and Family History were reviewed and updated.  [name] Physical Exam: Blood pressure 130/74, pulse (!) 131, temperature 97.1 F (36.2 C), temperature source Oral, resp. rate 18, height 5\' 8"  (1.727 m), weight 196 lb 3.2 oz (89 kg), SpO2 100 %. ECOG: 0 General appearance: Chronically ill-appearing woman without distress. Head: Normocephalic, without obvious abnormality no oral thrush or ulcers. Neck: no adenopathy Lymph nodes: Cervical, supraclavicular, and axillary nodes normal. Heart:regular rate and rhythm, S1, S2 normal, no murmur, click, rub or gallop Lung:chest clear, no wheezing, rales, normal symmetric air entry Abdomin: soft, non-tender, without masses or organomegaly no shifting dullness or ascites. EXT: Very little to no erythema noted on her palms.   Lab Results: Lab Results  Component Value Date   WBC 11.0 (H) 06/16/2016   HGB 10.8 (L) 06/16/2016   HCT 34.5 (L) 06/16/2016   MCV 95.7 06/16/2016   PLT 433.0 (H) 06/16/2016     Chemistry      Component Value Date/Time   NA 138 05/27/2016 0631   NA 139 05/20/2016 1204   K 3.5 05/27/2016 0631   K 4.4 05/20/2016 1204   CL 105 05/27/2016 0631   CO2 22 05/27/2016 0631   CO2 22 05/20/2016 1204   BUN 22 (H) 05/27/2016 0631   BUN 17.4 05/20/2016 1204   CREATININE 1.61 (H) 05/27/2016 0631   CREATININE 1.6 (H) 05/20/2016 1204      Component Value Date/Time   CALCIUM 8.5 (L) 05/27/2016 0631   CALCIUM 9.5 05/20/2016 1204   ALKPHOS 74 05/26/2016 1300   ALKPHOS 113 05/20/2016 1204   AST 24 05/26/2016 1300   AST 12 05/20/2016 1204   ALT 11 (L) 05/26/2016 1300   ALT 8 05/20/2016 1204    BILITOT 1.0 05/26/2016 1300   BILITOT 0.76 05/20/2016 1204      EXAM: CT CHEST, ABDOMEN, AND PELVIS WITH CONTRAST  TECHNIQUE: Multidetector CT imaging of the chest, abdomen and pelvis was performed following the standard protocol during bolus administration of intravenous contrast.  CONTRAST:  36mL ISOVUE-300 IOPAMIDOL (ISOVUE-300) INJECTION 61%  COMPARISON:  01/28/2016 and 317 living  FINDINGS: CT CHEST FINDINGS  Cardiovascular: No acute findings. Aortic and coronary artery atherosclerosis.  Mediastinum/Lymph Nodes: Partially necrotic lymph node in the aortopulmonary window measures 15 mm short axis on image 33/9, without significant change compared to 14 mm on previous study. 10 mm left hilar lymph node on image 43/9 shows no significant change compared with 11 mm on previous study. No  new or increased sites of lymphadenopathy identified. Small hiatal hernia again noted.  Lungs/Pleura: Multiple bilateral pulmonary nodules show no significant change in size or number since previous study. Dominant right lung nodule measures 17 mm in the right lower lobe on image 99/13, and 1.4 cm in the inferior lingula on image 95/13. No new or enlarging pulmonary nodules or masses identified. A new tiny left pleural effusion is noted.  Musculoskeletal:  No suspicious bone lesions identified.  CT ABDOMEN AND PELVIS FINDINGS  Hepatobiliary: Scattered tiny sub-cm low-attenuation lesions remain too small to characterize, but show no significant change since previous study suggesting benign etiology. No definite liver masses are identified. Gallbladder is unremarkable.  Pancreas:  No mass or inflammatory changes.  Spleen:  Within normal limits in size and appearance.  Adrenals/Urinary tract: Complex cystic lesion with septal and mural soft tissue thickening and enhancement in the posterior interpolar region of the left kidney measures 7.6 x 5.7 cm. This is  consistent with known renal cell carcinoma and shows no significant change compared to most recent study, although it has decreased in size compared to earlier exam on 06/14/2015. Other small bilateral Bosniak category 1 and 2 left renal cysts show no significant change. No evidence of hydronephrosis. Thrombus is again seen in the left renal vein which is suspicious tumor thrombus. No thrombus seen within the IVC.  Prior right nephrectomy. 1.3 x 2.4 cm left adrenal shows mild decrease in size compared to 06/14/2015 exam, when it measured 1.7 by 3.0 cm.  Stomach/Bowel: No evidence of obstruction, inflammatory process, or abnormal fluid collections.  Vascular/Lymphatic: No pathologically enlarged lymph nodes identified. No abdominal aortic aneurysm. Aortic atherosclerosis.  Reproductive: Inferior pelvis partially obscured artifact from right hip prosthesis. Prior hysterectomy noted. Adnexal regions are unremarkable in appearance.  Other:  Stable small fat containing bilateral inguinal hernias.  Musculoskeletal:  No suspicious bone lesions identified.  IMPRESSION: Stable mediastinal and left hilar lymphadenopathy.  No significant change in bilateral pulmonary metastases. New tiny left pleural effusion noted.  Stable complex cystic left renal mass, consistent with known renal cell carcinoma. Stable left renal vein thrombus, suspicious for tumor thrombus.  Mild decrease in size of left adrenal mass compared to previous exams.  No new or progressive metastatic disease identified.   Impression and Plan:  66 year old woman with the following issues:  1. Renal mass representing with hypermetabolic lesion measuring 9.5 x 7.9 x 8.5 cm in the posterior left kidney. PET/CT scan showed lesions in the lung, adrenal and possible tumor thrombus in the left renal vein.  She is status post biopsy on 07/04/2015. The biopsy confirmed the presence of renal cell  carcinoma.  She on Votrient at 800 mg daily since April 2017. Her dose was reduced to 600 mg in October 2017.  CT scan on 06/17/2016 continues to show stable disease and possible improvement in her adrenal nodule.  She has been experiencing more toxicities associated with Votrient including more fatigue and anorexia. I recommended decreasing her dose to 400 mg daily. Different salvage therapy can be used if she continues to struggle with complications related to this medication.  2. Liver function surveillance: Her LFTs remain within normal range.  3. Diarrhea prophylaxis: We have discussed strategies to improve her diarrhea control including Imodium and Lomotil. Decreasing the dose of Votrient will help.  4. Renal insufficiency: Creatinine has increased as of late with her most recent creatinine is 2.0. We will continue to monitor this moving forward.  6. Hand-foot syndrome: Improved  dramatically with decreasing the dose of Votrient.  7. Hip surgery: Completed in January 0272 without complications.  8. Anorexia and weight loss: Related to Votrient and recent illnesses. Megace was discontinued because of her recent thrombosis episode. Reducing the dose of Votrient will help her symptoms and we have discussed strategies to improve her nutritional intake including nutritional supplements.  9. Follow-up: Will be in 4 weeks.    Zola Button, MD 3/28/20181:18 PM

## 2016-06-25 ENCOUNTER — Encounter: Payer: Self-pay | Admitting: Gastroenterology

## 2016-07-07 ENCOUNTER — Ambulatory Visit (INDEPENDENT_AMBULATORY_CARE_PROVIDER_SITE_OTHER): Payer: Medicare Other | Admitting: Physician Assistant

## 2016-07-07 VITALS — BP 121/77 | HR 107 | Temp 97.7°F | Resp 18 | Ht 68.0 in | Wt 196.0 lb

## 2016-07-07 DIAGNOSIS — S31809D Unspecified open wound of unspecified buttock, subsequent encounter: Secondary | ICD-10-CM

## 2016-07-07 DIAGNOSIS — IMO0002 Reserved for concepts with insufficient information to code with codable children: Secondary | ICD-10-CM

## 2016-07-07 MED ORDER — DOXYCYCLINE HYCLATE 100 MG PO CAPS: 100.0000 mg | ORAL_CAPSULE | Freq: Two times a day (BID) | ORAL | 0 refills | Status: AC

## 2016-07-07 NOTE — Patient Instructions (Addendum)
Please take antibiotic to completion   Change dressing daily.  Let it breathe at night.   We are going to attempt to dry this as much as possible. I would like you to allow air to get to the buttocks as much as possible.  Sitting will obstruct this.  Laying on your side and stomach may be more helpful.      IF you received an x-ray today, you will receive an invoice from [facility]. Please contact [facility] at [phone] with questions or concerns regarding your invoice.   IF you received labwork today, you will receive an invoice from [facility]. Please contact [facility] at [phone] with questions or concerns regarding your invoice.   Our billing staff will not be able to assist you with questions regarding bills from these companies.  You will be contacted with the lab results as soon as they are available. The fastest way to get your results is to activate your My Chart account. Instructions are located on the last page of this paperwork. If you have not heard from [place] regarding the results in 2 weeks, please contact this office.

## 2016-07-08 ENCOUNTER — Encounter: Payer: Self-pay | Admitting: Gastroenterology

## 2016-07-08 ENCOUNTER — Ambulatory Visit (AMBULATORY_SURGERY_CENTER): Payer: Medicare Other | Admitting: Gastroenterology

## 2016-07-08 VITALS — BP 105/56 | HR 75 | Temp 96.2°F | Resp 20 | Ht 68.0 in | Wt 200.0 lb

## 2016-07-08 DIAGNOSIS — K222 Esophageal obstruction: Secondary | ICD-10-CM

## 2016-07-08 DIAGNOSIS — R195 Other fecal abnormalities: Secondary | ICD-10-CM

## 2016-07-08 MED ORDER — SODIUM CHLORIDE 0.9 % IV SOLN: 500.0000 mL | INTRAVENOUS | Status: DC

## 2016-07-08 MED ORDER — DEXTROSE 5 % IV SOLN: INTRAVENOUS | Status: DC

## 2016-07-08 NOTE — Progress Notes (Signed)
A and O x3. Report to RN. Tolerated MAC anesthesia well.Teeth unchanged after procedure.

## 2016-07-08 NOTE — Progress Notes (Signed)
Pt is on Eliquis- last dose this a.m.  She was not told to stop per D.O.

## 2016-07-08 NOTE — Patient Instructions (Addendum)
**   Start taking your protonix every other day instead of everyday**    YOU HAD AN ENDOSCOPIC PROCEDURE TODAY: Refer to the procedure report and other information in the discharge instructions given to you for any specific questions about what was found during the examination. If this information does not answer your questions, please call [place] office at [phone] to clarify.   YOU SHOULD EXPECT: Some feelings of bloating in the abdomen. Passage of more gas than usual. Walking can help get rid of the air that was put into your GI tract during the procedure and reduce the bloating. If you had a lower endoscopy (such as a colonoscopy or flexible sigmoidoscopy) you may notice spotting of blood in your stool or on the toilet paper. Some abdominal soreness may be present for a day or two, also.  DIET: Your first meal following the procedure should be a light meal and then it is ok to progress to your normal diet. A half-sandwich or bowl of soup is an example of a good first meal. Heavy or fried foods are harder to digest and may make you feel nauseous or bloated. Drink plenty of fluids but you should avoid alcoholic beverages for 24 hours. If you had a esophageal dilation, please see attached instructions for diet.    ACTIVITY: Your care partner should take you home directly after the procedure. You should plan to take it easy, moving slowly for the rest of the day. You can resume normal activity the day after the procedure however YOU SHOULD NOT DRIVE, use power tools, machinery or perform tasks that involve climbing or major physical exertion for 24 hours (because of the sedation medicines used during the test).   SYMPTOMS TO REPORT IMMEDIATELY: A gastroenterologist can be reached at any hour. Please call [phone]  for any of the following symptoms:   Following upper endoscopy (EGD, EUS, ERCP, esophageal dilation) Vomiting of blood or coffee ground material  New, significant abdominal pain   New, significant chest pain or pain under the shoulder blades  Painful or persistently difficult swallowing  New shortness of breath  Black, tarry-looking or red, bloody stools  FOLLOW UP:  If any biopsies were taken you will be contacted by phone or by letter within the next 1-3 weeks. Call [phone]  if you have not heard about the biopsies in 3 weeks.  Please also call with any specific questions about appointments or follow up tests.

## 2016-07-08 NOTE — Op Note (Signed)
[facility] Patient Name: Laura Neal Procedure Date: 07/08/2016 9:35 AM MRN: [id number] Endoscopist: Mallie Mussel L. Loletha Carrow , MD Age: 66 Referring MD:  Date of Birth: 1950-05-04 Gender: Female Account #: [id number] Procedure:                Upper GI endoscopy Indications:              Heme positive stool (also reported intermittent                            black stool while on aspirin and Eliquis). Patient                            on chemotherapy for metastatic renal cell carcinoma Medicines:                Monitored Anesthesia Care Procedure:                Pre-Anesthesia Assessment:                           - Prior to the procedure, a History and Physical                            was performed, and patient medications and                            allergies were reviewed. The patient's tolerance of                            previous anesthesia was also reviewed. The risks                            and benefits of the procedure and the sedation                            options and risks were discussed with the patient.                            All questions were answered, and informed consent                            was obtained. Anticoagulants: The patient has taken                            aspirin and Eliquis (apixaban). It was decided not                            to withhold these medications prior to procedure.                            ASA Grade Assessment: III - A patient with severe                            systemic disease. After reviewing the risks and  benefits, the patient was deemed in satisfactory                            condition to undergo the procedure.                           After obtaining informed consent, the endoscope was                            passed under direct vision. Throughout the                            procedure, the patient's blood pressure, pulse, and                            oxygen  saturations were monitored continuously. The                            Endoscope was introduced through the mouth, and                            advanced to the second part of duodenum. The upper                            GI endoscopy was accomplished without difficulty.                            The patient tolerated the procedure well. Scope In: Scope Out: Findings:                 A widely patent Schatzki ring (acquired) was found                            at the gastroesophageal junction.                           The stomach was normal.                           The cardia and gastric fundus were normal on                            retroflexion.                           The examined duodenum was normal. Complications:            No immediate complications. Estimated Blood Loss:     Estimated blood loss: none. Impression:               - Widely patent Schatzki ring.                           - Normal stomach.                           - Normal examined duodenum.                           -  No specimens collected.                           The black stool does not appear to have been due to                            UGI bleeding. Recommendation:           - Patient has a contact number available for                            emergencies. The signs and symptoms of potential                            delayed complications were discussed with the                            patient. Return to normal activities tomorrow.                            Written discharge instructions were provided to the                            patient.                           - Resume previous diet.                           - Continue present medications, except decrease                            pantoprazole to once every other day. Johannes Everage L. Loletha Carrow, MD 07/08/2016 9:49:24 AM This report has been signed electronically.

## 2016-07-09 ENCOUNTER — Telehealth: Payer: Self-pay

## 2016-07-09 NOTE — Telephone Encounter (Signed)
  Follow up Call-  Call back number 07/08/2016  Post procedure Call Back phone  # [phone]  Permission to leave phone message Yes  Some recent data might be hidden     Patient questions:  Do you have a fever, pain , or abdominal swelling? No. Pain Score  0 *  Have you tolerated food without any problems? Yes.    Have you been able to return to your normal activities? Yes.    Do you have any questions about your discharge instructions: Diet   No. Medications  No. Follow up visit  No.  Do you have questions or concerns about your Care? No.  Actions: * If pain score is 4 or above: No action needed, pain <4.

## 2016-07-12 NOTE — Progress Notes (Signed)
PRIMARY CARE AT [facility] [address] [place] [address] [phone]  Date:  07/07/2016   Name:  Laura Neal   DOB:  Mar 24, 1951   MRN:  [id number]  PCP:  Phineas Inches, MD    History of Present Illness:  Laura Neal is a 66 y.o. female patient who presents to PCP with  Chief Complaint  Patient presents with  . Sore    on buttocks after discharge from hospital in January     Patient reports that she has lesions along her buttocks for several weeks.  She reports that it is sore and painful.  She is consistently sitting.  Does not lay on side or on abdomen.  No fever.  She has no incontinence.  Does not wear protective bladder pads.     Patient Active Problem List   Diagnosis Date Noted  . S/P hip replacement, right 06/16/2016  . Hx of adenomatous colonic polyps 06/16/2016  . Chest pain   . Shortness of breath   . Renal cell carcinoma of right kidney ([place])   . Malignant neoplasm metastatic to left lung ([place])   . Pulmonary embolus ([place]) 05/26/2016  . OA (osteoarthritis) of hip 04/22/2016  . Type 2 diabetes mellitus with stage 3 chronic kidney disease, without long-term current use of insulin ([place]) 12/09/2014    Past Medical History:  Diagnosis Date  . Allergy   . Anemia   . Arthritis    "it was probably in my right hip" (05/26/2016)  . Cancer of right kidney ([place]) 2000   on PO chemotherapy /notes 05/26/2016  . GERD (gastroesophageal reflux disease)   . History of blood transfusion 03/2016   "w/hip replacement"  . History of stomach ulcers 1970s  . Hyperlipidemia   . Hypertension   . Pneumonia    "it's been awhile" (05/26/2016)  . Pulmonary embolism ([place]) 05/26/2016  . Seasonal allergies   . Seizures ([place])    "when I was young" (05/26/2016), none since childhood 07-08-16  . Type II diabetes mellitus ([place])   . Ulcer     Past Surgical History:  Procedure Laterality Date  . ABDOMINAL HYSTERECTOMY  1987   "left an ovary"  . BACK SURGERY    . COLONOSCOPY    .  ECTOPIC PREGNANCY SURGERY    . JOINT REPLACEMENT    . [place] SURGERY  1990s  . NEPHRECTOMY Right 2000   for cancer  . TOTAL HIP ARTHROPLASTY Right 04/22/2016   Procedure: RIGHT TOTAL HIP ARTHROPLASTY ANTERIOR APPROACH;  Surgeon: Gaynelle Arabian, MD;  Location: [facility] ORS;  Service: Orthopedics;  Laterality: Right;  . UPPER GASTROINTESTINAL ENDOSCOPY      Social History  Substance Use Topics  . Smoking status: Former Smoker    Packs/day: 0.50    Years: 40.00    Types: Cigarettes    Quit date: 03/30/2004  . Smokeless tobacco: Former Systems developer    Types: Chew, [place]: 05/26/2016 "tried chew and snuff when I was young"  . Alcohol use No    Family History  Problem Relation Age of Onset  . Hypertension Mother   . Hypertension Father   . Diabetes Sister   . Cancer Sister   . Diabetes Brother   . Hypertension Brother   . Colon cancer Neg Hx   . Esophageal cancer Neg Hx   . Stomach cancer Neg Hx   . Rectal cancer Neg Hx     Allergies  Allergen Reactions  .  Codeine Rash    Medication list has been reviewed and updated.  Current Outpatient Prescriptions on File Prior to Visit  Medication Sig Dispense Refill  . acetaminophen (TYLENOL) 325 MG tablet Take 2 tablets (650 mg total) by mouth every 6 (six) hours as needed for mild pain (or Fever >/= 101). 10 tablet 0  . apixaban (ELIQUIS) 5 MG TABS tablet Take 1 tablet (5 mg total) by mouth 2 (two) times daily. 120 tablet 0  . aspirin EC 81 MG tablet Take 81 mg by mouth daily.    [name] BYETTA 10 MCG PEN 10 MCG/0.04ML SOPN injection Inject 10 mg into the skin 2 (two) times daily.    . fluticasone (FLONASE) 50 MCG/ACT nasal spray Place 2 sprays into the nose daily. (Patient taking differently: Place 2 sprays into the nose daily as needed for allergies. ) 16 g 6  . furosemide (LASIX) 20 MG tablet Take 20 mg by mouth daily as needed for fluid or edema.     . hydrochlorothiazide (MICROZIDE) 12.5 MG capsule Take 1 capsule (12.5 mg total) by  mouth daily. 30 capsule 0  . insulin glargine (LANTUS) 100 UNIT/ML injection Inject 30 Units into the skin at bedtime.     [name] loperamide (IMODIUM) 2 MG capsule Take 2 mg by mouth as needed for diarrhea or loose stools (side effects of Votrient.).    [name] losartan (COZAAR) 50 MG tablet Take 1 tablet (50 mg total) by mouth daily. 30 tablet 0  . methocarbamol (ROBAXIN) 500 MG tablet Take 1 tablet (500 mg total) by mouth every 6 (six) hours as needed for muscle spasms. 80 tablet 0  . pantoprazole (PROTONIX) 40 MG tablet Take 1 tablet (40 mg total) by mouth daily. (Patient taking differently: Take 40 mg by mouth every other day. ) 30 tablet 3  . pazopanib (VOTRIENT) 200 MG tablet Take 3 tablets (600 mg total) by mouth daily. Take on an empty stomach. 90 tablet 0  . prochlorperazine (COMPAZINE) 10 MG tablet Take 1 tablet (10 mg total) by mouth every 6 (six) hours as needed for nausea or vomiting. 30 tablet 1  . rosuvastatin (CRESTOR) 10 MG tablet Take 10 mg by mouth at bedtime.     . traMADol (ULTRAM) 50 MG tablet Take 1-2 tablets (50-100 mg total) by mouth every 6 (six) hours as needed (mild pain). 56 tablet 0   No current facility-administered medications on file prior to visit.     ROS ROS otherwise unremarkable unless listed above.  Physical Examination: BP 121/77   Pulse (!) 107   Temp 97.7 F (36.5 C) (Oral)   Resp 18   Ht 5\' 8"  (1.727 m)   Wt 196 lb (88.9 kg)   SpO2 98%   BMI 29.80 kg/m  Ideal Body Weight: Weight in (lb) to have BMI = 25: 164.1  Physical Exam  Constitutional: She is oriented to person, place, and time. She appears well-developed and well-nourished. No distress.  HENT:  Head: Normocephalic and atraumatic.  Right Ear: External ear normal.  Left Ear: External ear normal.  Eyes: Conjunctivae and EOM are normal. Pupils are equal, round, and reactive to light.  Cardiovascular: Normal rate.   Pulmonary/Chest: Effort normal. No respiratory distress.  Musculoskeletal:   Ulcerative lesions shallow bilaterally at the inner part of the two buttocks.  Mild erythema surrounding.    Neurological: She is alert and oriented to person, place, and time.  Skin: She is not diaphoretic.  Psychiatric: She has a normal  mood and affect. Her behavior is normal.     Assessment and Plan: Laura Neal is a 66 y.o. female who is here today for cc of ulcerative lesions.   Advised antibiotic.  We will place nothing on the wounds.    1. Ulcerative lesion - doxycycline (VIBRAMYCIN) 100 MG capsule; Take 1 capsule (100 mg total) by mouth 2 (two) times daily.  Dispense: 20 capsule; Refill: 0   Ivar Drape, PA-C [facility] 07/12/2016 8:46 PM

## 2016-07-15 ENCOUNTER — Encounter: Payer: Self-pay | Admitting: Physician Assistant

## 2016-07-15 ENCOUNTER — Ambulatory Visit (INDEPENDENT_AMBULATORY_CARE_PROVIDER_SITE_OTHER): Payer: Medicare Other | Admitting: Physician Assistant

## 2016-07-15 VITALS — BP 116/70 | HR 96 | Temp 97.3°F | Resp 16 | Ht 68.0 in | Wt 194.2 lb

## 2016-07-15 DIAGNOSIS — L98499 Non-pressure chronic ulcer of skin of other sites with unspecified severity: Secondary | ICD-10-CM | POA: Diagnosis not present

## 2016-07-15 NOTE — Patient Instructions (Addendum)
   This looks like it is healing well.  Please continue to wash and pat dry, and get as little occlusion, and covering of the wound as much as possible.   Continue the antibiotic, and good eating.   We will follow up in 1 week.  IF you received an x-ray today, you will receive an invoice from [facility]. Please contact [facility] at [phone] with questions or concerns regarding your invoice.   IF you received lab work today, you will receive an invoice from [employer]. Please contact [employer] at [phone] with questions or concerns regarding your invoice.   Our billing staff will not be able to assist you with questions regarding bills from these companies.  You will be contacted with the lab results as soon as they are available. The fastest way to get your results is to activate your My Chart account. Instructions are located on the last page of this paperwork. If you have not heard from [place] regarding the results in 2 weeks, please contact this office.

## 2016-07-17 NOTE — Progress Notes (Signed)
PRIMARY CARE AT [facility] [address] [place] [address] [phone]  Date:  07/15/2016   Name:  Laura Neal   DOB:  [birth year]   MRN:  [id number]  PCP:  Phineas Inches, MD    History of Present Illness:  Laura Neal is a 66 y.o. female patient who presents to PCP with  Chief Complaint  Patient presents with  . Follow-up    recheck bottom     Patient is here today after 8 days following ulcerative lesions found on buttock.  Wet appearing, advised dry bandaging, and less pressure with ambulation and laying off the buttock.  She returns today reporting that the lesions have improved.  She has felt more energy as well, and eating.  No fevers, no drainage of the rash.  Patient Active Problem List   Diagnosis Date Noted  . S/P hip replacement, right 06/16/2016  . Hx of adenomatous colonic polyps 06/16/2016  . Chest pain   . Shortness of breath   . Renal cell carcinoma of right kidney ([place])   . Malignant neoplasm metastatic to left lung ([place])   . Pulmonary embolus ([place]) 05/26/2016  . OA (osteoarthritis) of hip 04/22/2016  . Type 2 diabetes mellitus with stage 3 chronic kidney disease, without long-term current use of insulin ([place]) 12/09/2014    Past Medical History:  Diagnosis Date  . Allergy   . Anemia   . Arthritis    "it was probably in my right hip" (05/26/2016)  . Cancer of right kidney ([place]) 2000   on PO chemotherapy /notes 05/26/2016  . GERD (gastroesophageal reflux disease)   . History of blood transfusion 03/2016   "w/hip replacement"  . History of stomach ulcers 1970s  . Hyperlipidemia   . Hypertension   . Pneumonia    "it's been awhile" (05/26/2016)  . Pulmonary embolism ([place]) 05/26/2016  . Seasonal allergies   . Seizures ([place])    "when I was young" (05/26/2016), none since childhood 07-08-16  . Type II diabetes mellitus ([place])   . Ulcer     Past Surgical History:  Procedure Laterality Date  . ABDOMINAL HYSTERECTOMY  1987   "left an ovary"  . BACK  SURGERY    . COLONOSCOPY    . ECTOPIC PREGNANCY SURGERY    . JOINT REPLACEMENT    . [place] SURGERY  1990s  . NEPHRECTOMY Right 2000   for cancer  . TOTAL HIP ARTHROPLASTY Right 04/22/2016   Procedure: RIGHT TOTAL HIP ARTHROPLASTY ANTERIOR APPROACH;  Surgeon: Gaynelle Arabian, MD;  Location: [facility];  Service: Orthopedics;  Laterality: Right;  . UPPER GASTROINTESTINAL ENDOSCOPY      Social History  Substance Use Topics  . Smoking status: Former Smoker    Packs/day: 0.50    Years: 40.00    Types: Cigarettes    Quit date: 03/30/2004  . Smokeless tobacco: Former Systems developer    Types: Chew, [place]: 05/26/2016 "tried chew and snuff when I was young"  . Alcohol use No    Family History  Problem Relation Age of Onset  . Hypertension Mother   . Hypertension Father   . Diabetes Sister   . Cancer Sister   . Diabetes Brother   . Hypertension Brother   . Colon cancer Neg Hx   . Esophageal cancer Neg Hx   . Stomach cancer Neg Hx   . Rectal cancer Neg Hx     Allergies  Allergen Reactions  . Codeine  Rash    Medication list has been reviewed and updated.  Current Outpatient Prescriptions on File Prior to Visit  Medication Sig Dispense Refill  . acetaminophen (TYLENOL) 325 MG tablet Take 2 tablets (650 mg total) by mouth every 6 (six) hours as needed for mild pain (or Fever >/= 101). 10 tablet 0  . apixaban (ELIQUIS) 5 MG TABS tablet Take 1 tablet (5 mg total) by mouth 2 (two) times daily. 120 tablet 0  . aspirin EC 81 MG tablet Take 81 mg by mouth daily.    [name] BYETTA 10 MCG PEN 10 MCG/0.04ML SOPN injection Inject 10 mg into the skin 2 (two) times daily.    [name] doxycycline (VIBRAMYCIN) 100 MG capsule Take 1 capsule (100 mg total) by mouth 2 (two) times daily. 20 capsule 0  . fluticasone (FLONASE) 50 MCG/ACT nasal spray Place 2 sprays into the nose daily. (Patient taking differently: Place 2 sprays into the nose daily as needed for allergies. ) 16 g 6  . furosemide (LASIX) 20 MG  tablet Take 20 mg by mouth daily as needed for fluid or edema.     . hydrochlorothiazide (MICROZIDE) 12.5 MG capsule Take 1 capsule (12.5 mg total) by mouth daily. 30 capsule 0  . insulin glargine (LANTUS) 100 UNIT/ML injection Inject 30 Units into the skin at bedtime.     [name] loperamide (IMODIUM) 2 MG capsule Take 2 mg by mouth as needed for diarrhea or loose stools (side effects of Votrient.).    [name] losartan (COZAAR) 50 MG tablet Take 1 tablet (50 mg total) by mouth daily. 30 tablet 0  . methocarbamol (ROBAXIN) 500 MG tablet Take 1 tablet (500 mg total) by mouth every 6 (six) hours as needed for muscle spasms. 80 tablet 0  . pantoprazole (PROTONIX) 40 MG tablet Take 1 tablet (40 mg total) by mouth daily. (Patient taking differently: Take 40 mg by mouth every other day. ) 30 tablet 3  . pazopanib (VOTRIENT) 200 MG tablet Take 3 tablets (600 mg total) by mouth daily. Take on an empty stomach. 90 tablet 0  . prochlorperazine (COMPAZINE) 10 MG tablet Take 1 tablet (10 mg total) by mouth every 6 (six) hours as needed for nausea or vomiting. 30 tablet 1  . rosuvastatin (CRESTOR) 10 MG tablet Take 10 mg by mouth at bedtime.     . traMADol (ULTRAM) 50 MG tablet Take 1-2 tablets (50-100 mg total) by mouth every 6 (six) hours as needed (mild pain). 56 tablet 0   Current Facility-Administered Medications on File Prior to Visit  Medication Dose Route Frequency Provider Last Rate Last Dose  . 0.9 %  sodium chloride infusion  500 mL Intravenous Continuous Nelida Meuse III, MD      . dextrose 5 % solution   Intravenous Continuous Nelida Meuse III, MD        ROS ROS otherwise unremarkable unless listed above.  Physical Examination: BP 116/70 (BP Location: Right Arm, Patient Position: Sitting, Cuff Size: Small)   Pulse 96   Temp 97.3 F (36.3 C) (Oral)   Resp 16   Ht 5\' 8"  (1.727 m)   Wt 194 lb 3.2 oz (88.1 kg)   SpO2 97%   BMI 29.53 kg/m  Ideal Body Weight: Weight in (lb) to have BMI = 25:  164.1  Physical Exam  Constitutional: She is oriented to person, place, and time. She appears well-developed and well-nourished. No distress.  HENT:  Head: Normocephalic and atraumatic.  Right Ear:  External ear normal.  Left Ear: External ear normal.  Eyes: Conjunctivae and EOM are normal. Pupils are equal, round, and reactive to light.  Cardiovascular: Normal rate.   Pulmonary/Chest: Effort normal. No respiratory distress.  Neurological: She is alert and oriented to person, place, and time.  Skin: She is not diaphoretic.  Shallow bilateral ulcers at the inner buttocks, without drainage or necrosis.   Psychiatric: She has a normal mood and affect. Her behavior is normal.    Assessment and Plan: Laura Neal is a 66 y.o. female who is here today for cc of buttock rash follow up. This appears to be healing well. Advised ocntinued washing and keeping dry as much as possible.  Return in 1 week. Superficial ulcerative lesion (Laura Neal)  Ivar Drape, PA-C [facility] 4/20/20188:10 AM

## 2016-07-22 ENCOUNTER — Ambulatory Visit: Payer: Medicare Other | Admitting: Physician Assistant

## 2016-07-23 ENCOUNTER — Other Ambulatory Visit: Payer: Self-pay | Admitting: *Deleted

## 2016-07-23 DIAGNOSIS — C649 Malignant neoplasm of unspecified kidney, except renal pelvis: Secondary | ICD-10-CM

## 2016-07-23 MED ORDER — PAZOPANIB HCL 200 MG PO TABS: 600.0000 mg | ORAL_TABLET | Freq: Every day | ORAL | 0 refills | Status: DC

## 2016-07-23 MED FILL — VOTRIENT 200 MG TABLET: 200 | 30 days supply | Qty: 90 | Fill #0

## 2016-07-27 ENCOUNTER — Ambulatory Visit (INDEPENDENT_AMBULATORY_CARE_PROVIDER_SITE_OTHER): Payer: Medicare Other | Admitting: Physician Assistant

## 2016-07-27 ENCOUNTER — Encounter: Payer: Self-pay | Admitting: Physician Assistant

## 2016-07-27 VITALS — BP 106/66 | HR 104 | Temp 98.4°F | Resp 18 | Ht 68.0 in | Wt 192.2 lb

## 2016-07-27 DIAGNOSIS — R319 Hematuria, unspecified: Secondary | ICD-10-CM | POA: Diagnosis not present

## 2016-07-27 LAB — POCT URINALYSIS DIP (MANUAL ENTRY)
Bilirubin, UA: NEGATIVE
Glucose, UA: NEGATIVE mg/dL
NITRITE UA: NEGATIVE
PH UA: 5.5 (ref 5.0–8.0)
Spec Grav, UA: >=1.03 — AB (ref 1.010–1.025)
UROBILINOGEN UA: 0.2 U/dL

## 2016-07-27 NOTE — Progress Notes (Signed)
[facility] [address], [place] [address] [phone]  Date:  07/27/2016   Name:  Laura Neal   DOB:  08/22/1950   MRN:  [id number]  PCP:  BOUSKA,DAVID E, MD    History of Present Illness:  Laura Neal is a 66 y.o. female patient who presents to PCP with  Chief Complaint  Patient presents with  . Hematuria    would like it to be tested   . Follow-up    recheck buttocks      Patient states that she has had hematuria for a couple days at this time.  She has no fever, but states that she does have a change in appetite.  No frequency or dysuria.   No abnormal back pain.   She is also here for recheck of her buttocks.  This is healing well.  Now using a donut.  No drainage to this area.    Patient Active Problem List   Diagnosis Date Noted  . S/P hip replacement, right 06/16/2016  . Hx of adenomatous colonic polyps 06/16/2016  . Chest pain   . Shortness of breath   . Renal cell carcinoma of right kidney (HCC)   . 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 Pulmonary embolus (HCC) 05/26/2016  . OA (osteoarthritis) of hip 04/22/2016  . Type 2 diabetes mellitus with stage 3 chronic kidney disease, without long-term current use of insulin (HCC) 12/09/2014    Past Medical History:  Diagnosis Date  . Allergy   . Anemia   . Arthritis    "it was probably in my right hip" (05/26/2016)  . Cancer of right kidney (HCC) 2000   on PO chemotherapy /notes 05/26/2016  . GERD (gastroesophageal reflux disease)   . History of blood transfusion 03/2016   "w/hip replacement"  . History of stomach ulcers 1970s  . Hyperlipidemia   . Hypertension   . Pneumonia    "it's been awhile" (05/26/2016)  . Pulmonary embolism (HCC) 05/26/2016  . Seasonal allergies   . Seizures (HCC)    "when I was young" (05/26/2016), none since childhood 07-08-16  . Type II diabetes mellitus (HCC)   . Ulcer     Past Surgical History:  Procedure Laterality Date  . ABDOMINAL  HYSTERECTOMY  1987   "left an ovary"  . BACK SURGERY    . COLONOSCOPY    . ECTOPIC PREGNANCY SURGERY    . JOINT REPLACEMENT    . LUMBAR DISC SURGERY  1990s  . NEPHRECTOMY Right 2000   for cancer  . TOTAL HIP ARTHROPLASTY Right 04/22/2016   Procedure: RIGHT TOTAL HIP ARTHROPLASTY ANTERIOR APPROACH;  Surgeon: Frank Aluisio, MD;  Location: [facility];  Service: Orthopedics;  Laterality: Right;  . UPPER GASTROINTESTINAL ENDOSCOPY      Social History  Substance Use Topics  . Smoking status: Former Smoker    Packs/day: 0.50    Years: 40.00    Types: Cigarettes    Quit date: 03/30/2004  . Smokeless tobacco: Former User    Types: Chew, Snuff     Comment: 05/26/2016 "tried chew and snuff when I was young"  . Alcohol use No    Family History  Problem Relation Age of Onset  . Hypertension Mother   . Hypertension Father   . Diabetes Sister   . Cancer Sister   . Diabetes Brother   . Hypertension Brother   . Colon cancer Neg Hx   . Esophageal cancer Neg   Hx   . Stomach cancer Neg Hx   . Rectal cancer Neg Hx     Allergies  Allergen Reactions  . Codeine Rash    Medication list has been reviewed and updated.  Current Outpatient Prescriptions on File Prior to Visit  Medication Sig Dispense Refill  . acetaminophen (TYLENOL) 325 MG tablet Take 2 tablets (650 mg total) by mouth every 6 (six) hours as needed for mild pain (or Fever >/= 101). 10 tablet 0  . apixaban (ELIQUIS) 5 MG TABS tablet Take 1 tablet (5 mg total) by mouth 2 (two) times daily. 120 tablet 0  . aspirin EC 81 MG tablet Take 81 mg by mouth daily.    [name] BYETTA 10 MCG PEN 10 MCG/0.04ML SOPN injection Inject 10 mg into the skin 2 (two) times daily.    . fluticasone (FLONASE) 50 MCG/ACT nasal spray Place 2 sprays into the nose daily. (Patient taking differently: Place 2 sprays into the nose daily as needed for allergies. ) 16 g 6  . furosemide (LASIX) 20 MG tablet Take 20 mg by mouth daily as needed for fluid or edema.     .  hydrochlorothiazide (MICROZIDE) 12.5 MG capsule Take 1 capsule (12.5 mg total) by mouth daily. 30 capsule 0  . insulin glargine (LANTUS) 100 UNIT/ML injection Inject 30 Units into the skin at bedtime.     [name] loperamide (IMODIUM) 2 MG capsule Take 2 mg by mouth as needed for diarrhea or loose stools (side effects of Votrient.).    [name] losartan (COZAAR) 50 MG tablet Take 1 tablet (50 mg total) by mouth daily. 30 tablet 0  . methocarbamol (ROBAXIN) 500 MG tablet Take 1 tablet (500 mg total) by mouth every 6 (six) hours as needed for muscle spasms. 80 tablet 0  . pantoprazole (PROTONIX) 40 MG tablet Take 1 tablet (40 mg total) by mouth daily. (Patient taking differently: Take 40 mg by mouth every other day. ) 30 tablet 3  . pazopanib (VOTRIENT) 200 MG tablet Take 3 tablets (600 mg total) by mouth daily. Take on an empty stomach. 90 tablet 0  . prochlorperazine (COMPAZINE) 10 MG tablet Take 1 tablet (10 mg total) by mouth every 6 (six) hours as needed for nausea or vomiting. 30 tablet 1  . rosuvastatin (CRESTOR) 10 MG tablet Take 10 mg by mouth at bedtime.     . traMADol (ULTRAM) 50 MG tablet Take 1-2 tablets (50-100 mg total) by mouth every 6 (six) hours as needed (mild pain). 56 tablet 0   Current Facility-Administered Medications on File Prior to Visit  Medication Dose Route Frequency Provider Last Rate Last Dose  . 0.9 %  sodium chloride infusion  500 mL Intravenous Continuous Nelida Meuse III, MD      . dextrose 5 % solution   Intravenous Continuous Nelida Meuse III, MD        ROS ROS otherwise unremarkable unless listed above.  Physical Examination: BP 106/66   Pulse (!) 104   Temp 98.4 F (36.9 C) (Oral)   Resp 18   Ht 5' 8" (1.727 m)   Wt 192 lb 3.2 oz (87.2 kg)   SpO2 96%   BMI 29.22 kg/m  Ideal Body Weight: Weight in (lb) to have BMI = 25: 164.1  Physical Exam  Constitutional: She is oriented to person, place, and time. She appears well-developed and well-nourished. No distress.   HENT:  Head: Normocephalic and atraumatic.  Right Ear: External ear normal.  Left  Ear: External ear normal.  Eyes: Conjunctivae and EOM are normal. Pupils are equal, round, and reactive to light.  Cardiovascular: Normal rate.   Pulmonary/Chest: Effort normal. No respiratory distress.  Neurological: She is alert and oriented to person, place, and time.  Skin: She is not diaphoretic.  Right sided with 2 healing ulcers along the medial bordering of the buttocks.  These are non tender with some mild surrounding erythema.    Psychiatric: She has a normal mood and affect. Her behavior is normal.     Results for orders placed or performed in visit on 07/27/16  POCT urinalysis dipstick  Result Value Ref Range   Color, UA yellow yellow   Clarity, UA clear clear   Glucose, UA negative negative mg/dL   Bilirubin, UA negative negative   Ketones, POC UA trace (5) (A) negative mg/dL   Spec Grav, UA >=1.030 (A) 1.010 - 1.025   Blood, UA large (A) negative   pH, UA 5.5 5.0 - 8.0   Protein Ur, POC =100 (A) negative mg/dL   Urobilinogen, UA 0.2 0.2 or 1.0 E.U./dL   Nitrite, UA Negative Negative   Leukocytes, UA Small (1+) (A) Negative    Assessment and Plan: Laura Neal is a 66 y.o. female who is here today for recheck of her wounds. These are healing well.  We will now apply a little desitin to the area.  Advised to follow up in one week.  Continue the same regimen, with washes and maintaining dryness. I will obtain a culture at this time. She will also be followed by oncology. Hematuria, unspecified type - Plan: POCT urinalysis dipstick, CMP14+EGFR, Urine culture  Stephanie English, PA-C [facility] 5/1/20186:05 PM  

## 2016-07-27 NOTE — Patient Instructions (Addendum)
  Continue what you are doing. Except we will add a thin layer daily of zinc oxide cream.  This can be picked up from the pharmacy--diaper rash cream.   followup in 1 week.   IF you received an x-ray today, you will receive an invoice from [facility]. Please contact [facility] at [phone] with questions or concerns regarding your invoice.   IF you received labwork today, you will receive an invoice from [place]. Please contact [facility] at [phone] with questions or concerns regarding your invoice.   Our billing staff will not be able to assist you with questions regarding bills from these companies.  You will be contacted with the lab results as soon as they are available. The fastest way to get your results is to activate your My Chart account. Instructions are located on the last page of this paperwork. If you have not heard from [place] regarding the results in 2 weeks, please contact this office.

## 2016-07-28 ENCOUNTER — Telehealth: Payer: Self-pay | Admitting: Oncology

## 2016-07-28 ENCOUNTER — Ambulatory Visit (HOSPITAL_BASED_OUTPATIENT_CLINIC_OR_DEPARTMENT_OTHER): Payer: Medicare Other | Admitting: Oncology

## 2016-07-28 ENCOUNTER — Other Ambulatory Visit (HOSPITAL_BASED_OUTPATIENT_CLINIC_OR_DEPARTMENT_OTHER): Payer: Medicare Other

## 2016-07-28 VITALS — BP 113/51 | HR 96 | Temp 98.0°F | Resp 17 | Ht 68.0 in | Wt 192.1 lb

## 2016-07-28 DIAGNOSIS — C7800 Secondary malignant neoplasm of unspecified lung: Secondary | ICD-10-CM

## 2016-07-28 DIAGNOSIS — I2699 Other pulmonary embolism without acute cor pulmonale: Secondary | ICD-10-CM

## 2016-07-28 DIAGNOSIS — C7972 Secondary malignant neoplasm of left adrenal gland: Secondary | ICD-10-CM

## 2016-07-28 DIAGNOSIS — C7971 Secondary malignant neoplasm of right adrenal gland: Secondary | ICD-10-CM

## 2016-07-28 DIAGNOSIS — C641 Malignant neoplasm of right kidney, except renal pelvis: Secondary | ICD-10-CM

## 2016-07-28 DIAGNOSIS — R634 Abnormal weight loss: Secondary | ICD-10-CM

## 2016-07-28 DIAGNOSIS — N289 Disorder of kidney and ureter, unspecified: Secondary | ICD-10-CM

## 2016-07-28 DIAGNOSIS — I82409 Acute embolism and thrombosis of unspecified deep veins of unspecified lower extremity: Secondary | ICD-10-CM

## 2016-07-28 DIAGNOSIS — L271 Localized skin eruption due to drugs and medicaments taken internally: Secondary | ICD-10-CM

## 2016-07-28 DIAGNOSIS — C642 Malignant neoplasm of left kidney, except renal pelvis: Secondary | ICD-10-CM

## 2016-07-28 DIAGNOSIS — R630 Anorexia: Secondary | ICD-10-CM

## 2016-07-28 LAB — CMP14+EGFR
A/G RATIO: 1 — AB (ref 1.2–2.2)
ALBUMIN: 3.2 g/dL — AB (ref 3.6–4.8)
ALT: 5 IU/L (ref 0–32)
AST: 11 IU/L (ref 0–40)
Alkaline Phosphatase: 122 IU/L — ABNORMAL HIGH (ref 39–117)
BILIRUBIN TOTAL: 0.4 mg/dL (ref 0.0–1.2)
BUN / CREAT RATIO: 11 — AB (ref 12–28)
BUN: 21 mg/dL (ref 8–27)
CHLORIDE: 98 mmol/L (ref 96–106)
CO2: 22 mmol/L (ref 18–29)
Calcium: 9 mg/dL (ref 8.7–10.3)
Creatinine, Ser: 1.85 mg/dL — ABNORMAL HIGH (ref 0.57–1.00)
GFR calc non Af Amer: 28 mL/min/{1.73_m2} — ABNORMAL LOW (ref >59)
GFR, EST AFRICAN AMERICAN: 32 mL/min/{1.73_m2} — AB (ref >59)
GLOBULIN, TOTAL: 3.2 g/dL (ref 1.5–4.5)
GLUCOSE: 117 mg/dL — AB (ref 65–99)
Potassium: 3.3 mmol/L — ABNORMAL LOW (ref 3.5–5.2)
SODIUM: 138 mmol/L (ref 134–144)
TOTAL PROTEIN: 6.4 g/dL (ref 6.0–8.5)

## 2016-07-28 LAB — CBC WITH DIFFERENTIAL/PLATELET
BASO%: 0.4 % (ref 0.0–2.0)
Basophils Absolute: 0 10*3/uL (ref 0.0–0.1)
EOS%: 1.5 % (ref 0.0–7.0)
Eosinophils Absolute: 0.1 10*3/uL (ref 0.0–0.5)
HCT: 28.3 % — ABNORMAL LOW (ref 34.8–46.6)
HGB: 8.9 g/dL — ABNORMAL LOW (ref 11.6–15.9)
LYMPH%: 17.1 % (ref 14.0–49.7)
MCH: 29.3 pg (ref 25.1–34.0)
MCHC: 31.4 g/dL — AB (ref 31.5–36.0)
MCV: 93.3 fL (ref 79.5–101.0)
MONO#: 0.4 10*3/uL (ref 0.1–0.9)
MONO%: 4.9 % (ref 0.0–14.0)
NEUT#: 6.8 10*3/uL — ABNORMAL HIGH (ref 1.5–6.5)
NEUT%: 76.1 % (ref 38.4–76.8)
PLATELETS: 478 10*3/uL — AB (ref 145–400)
RBC: 3.04 10*6/uL — AB (ref 3.70–5.45)
RDW: 21.7 % — ABNORMAL HIGH (ref 11.2–14.5)
WBC: 8.9 10*3/uL (ref 3.9–10.3)
lymph#: 1.5 10*3/uL (ref 0.9–3.3)

## 2016-07-28 LAB — COMPREHENSIVE METABOLIC PANEL
ALT: <6 U/L (ref 0–55)
ANION GAP: 12 meq/L — AB (ref 3–11)
AST: 10 U/L (ref 5–34)
Albumin: 2.4 g/dL — ABNORMAL LOW (ref 3.5–5.0)
Alkaline Phosphatase: 111 U/L (ref 40–150)
BUN: 19.2 mg/dL (ref 7.0–26.0)
CHLORIDE: 103 meq/L (ref 98–109)
CO2: 22 meq/L (ref 22–29)
Calcium: 8.9 mg/dL (ref 8.4–10.4)
Creatinine: 2 mg/dL — ABNORMAL HIGH (ref 0.6–1.1)
EGFR: 29 mL/min/{1.73_m2} — AB (ref >90)
Glucose: 218 mg/dl — ABNORMAL HIGH (ref 70–140)
Potassium: 3.2 mEq/L — ABNORMAL LOW (ref 3.5–5.1)
Sodium: 137 mEq/L (ref 136–145)
Total Bilirubin: 0.54 mg/dL (ref 0.20–1.20)
Total Protein: 6.6 g/dL (ref 6.4–8.3)

## 2016-07-28 MED ORDER — MIRTAZAPINE 15 MG PO TABS: 15.0000 mg | ORAL_TABLET | Freq: Every day | ORAL | 1 refills | Status: AC

## 2016-07-28 NOTE — Progress Notes (Signed)
Hematology and Oncology Follow Up Visit  Laura Neal [id number] [birth year] 66 y.o. 07/28/2016 1:23 PM [name], MD   Principle Diagnosis: 66 year old woman with renal cell carcinoma. She presented with a 9.5 x 7.9 x 8.5 cm mass in the left kidney in March 2017. PET CT scan showed metastatic disease to the lung as well as the adrenal glands. This is biopsy proven to be renal cell carcinoma.   Prior Therapy: She is status post biopsy of her kidney mass which confirmed the presence of renal cell carcinoma on 07/04/2015.  Current therapy: Votrient 800 mg daily started on 07/16/2015. The dose will be reduced to 600 mg starting on 12/31/2015. The dose was reduced again to 400 mg daily on 06/24/2016.  Interim History: Laura Neal presents today for a follow-up visit. Since the last visit, she reports that improvement in his overall health since she was hospitalized in February 2018 after diagnosis of a deep vein thrombosis and pulmonary embolism. She has resumed a lot of activities of daily living including ambulation without the help of a walker or cane. She continues to have issues with appetite however and of lost 5 pounds since the last visit.  She continues to be on Votrient given at the 400 mg dose with better tolerance. She denied any hypertension, diarrhea or mouth pain. She denied any recent falls or syncope. She denied any other fractures. Her quality of life is slowly improving.  She does not report any headaches, blurry vision, syncope or seizures. She does not report any fevers, chills, sweats or decline in her energy. She does not report any chest pain, palpitation, orthopnea or leg edema. She does not report any cough, wheezing or hemoptysis. She does not report any nausea, vomiting, abdominal pain, hematochezia or melena. She does not report any frequency, urgency or hesitancy. She does not report any arthralgias or myalgias. She does not report any lymphadenopathy or  petechiae. Remaining review of systems unremarkable.    Medications: I have reviewed the patient's current medications.  Current Outpatient Prescriptions  Medication Sig Dispense Refill  . acetaminophen (TYLENOL) 325 MG tablet Take 2 tablets (650 mg total) by mouth every 6 (six) hours as needed for mild pain (or Fever >/= 101). 10 tablet 0  . apixaban (ELIQUIS) 5 MG TABS tablet Take 1 tablet (5 mg total) by mouth 2 (two) times daily. 120 tablet 0  . aspirin EC 81 MG tablet Take 81 mg by mouth daily.    [name] BYETTA 10 MCG PEN 10 MCG/0.04ML SOPN injection Inject 10 mg into the skin 2 (two) times daily.    . fluticasone (FLONASE) 50 MCG/ACT nasal spray Place 2 sprays into the nose daily. (Patient taking differently: Place 2 sprays into the nose daily as needed for allergies. ) 16 g 6  . furosemide (LASIX) 20 MG tablet Take 20 mg by mouth daily as needed for fluid or edema.     . hydrochlorothiazide (MICROZIDE) 12.5 MG capsule Take 1 capsule (12.5 mg total) by mouth daily. 30 capsule 0  . insulin glargine (LANTUS) 100 UNIT/ML injection Inject 30 Units into the skin at bedtime.     [name] loperamide (IMODIUM) 2 MG capsule Take 2 mg by mouth as needed for diarrhea or loose stools (side effects of Votrient.).    [name] losartan (COZAAR) 50 MG tablet Take 1 tablet (50 mg total) by mouth daily. 30 tablet 0  . methocarbamol (ROBAXIN) 500 MG tablet Take 1 tablet (500 mg total) by  mouth every 6 (six) hours as needed for muscle spasms. 80 tablet 0  . pantoprazole (PROTONIX) 40 MG tablet Take 1 tablet (40 mg total) by mouth daily. (Patient taking differently: Take 40 mg by mouth every other day. ) 30 tablet 3  . pazopanib (VOTRIENT) 200 MG tablet Take 3 tablets (600 mg total) by mouth daily. Take on an empty stomach. 90 tablet 0  . prochlorperazine (COMPAZINE) 10 MG tablet Take 1 tablet (10 mg total) by mouth every 6 (six) hours as needed for nausea or vomiting. 30 tablet 1  . rosuvastatin (CRESTOR) 10 MG tablet Take 10 mg  by mouth at bedtime.     . traMADol (ULTRAM) 50 MG tablet Take 1-2 tablets (50-100 mg total) by mouth every 6 (six) hours as needed (mild pain). 56 tablet 0  . mirtazapine (REMERON) 15 MG tablet Take 1 tablet (15 mg total) by mouth at bedtime. 30 tablet 1   No current facility-administered medications for this visit.      Allergies:  Allergies  Allergen Reactions  . Codeine Rash    Past Medical History, Surgical history, Social history, and Family History were reviewed and updated.  [name] Physical Exam: Blood pressure (!) 113/51, pulse 96, temperature 98 F (36.7 C), temperature source Oral, resp. rate 17, height 5\' 8"  (1.727 m), weight 192 lb 1.6 oz (87.1 kg), SpO2 99 %. ECOG: 0 General appearance:  Alert, awake woman without distress. Head: Normocephalic, without obvious abnormality no oral thrush or ulcers. Neck: no adenopathy Lymph nodes: Cervical, supraclavicular, and axillary nodes normal. Heart:regular rate and rhythm, S1, S2 normal, no murmur, click, rub or gallop Lung:chest clear, no wheezing, rales, normal symmetric air entry Abdomin: soft, non-tender, without masses or organomegaly no rebound or guarding. EXT: Very little to no erythema noted on her palms.   Lab Results: Lab Results  Component Value Date   WBC 8.9 07/28/2016   HGB 8.9 (L) 07/28/2016   HCT 28.3 (L) 07/28/2016   MCV 93.3 07/28/2016   PLT 478 (H) 07/28/2016     Chemistry      Component Value Date/Time   NA 138 07/27/2016 1308   NA 140 06/24/2016 1240   K 3.3 (L) 07/27/2016 1308   K 3.6 06/24/2016 1240   CL 98 07/27/2016 1308   CO2 22 07/27/2016 1308   CO2 19 (L) 06/24/2016 1240   BUN 21 07/27/2016 1308   BUN 13.7 06/24/2016 1240   CREATININE 1.85 (H) 07/27/2016 1308   CREATININE 2.0 (H) 06/24/2016 1240      Component Value Date/Time   CALCIUM 9.0 07/27/2016 1308   CALCIUM 9.5 06/24/2016 1240   ALKPHOS 122 (H) 07/27/2016 1308   ALKPHOS 107 06/24/2016 1240   AST 11 07/27/2016 1308   AST  17 06/24/2016 1240   ALT 5 07/27/2016 1308   ALT 13 06/24/2016 1240   BILITOT 0.4 07/27/2016 1308   BILITOT 0.60 06/24/2016 1240        Impression and Plan:  66 year old woman with the following issues:  1. Renal mass representing with hypermetabolic lesion measuring 9.5 x 7.9 x 8.5 cm in the posterior left kidney. PET/CT scan showed lesions in the lung, adrenal and possible tumor thrombus in the left renal vein.  She is status post biopsy on 07/04/2015. The biopsy confirmed the presence of renal cell carcinoma.  She on Votrient at 800 mg daily since April 2017. Her dose was reduced to 600 mg in October 2017.  CT scan on 06/17/2016 continues  to show stable disease and possible improvement in her adrenal nodule.  The plan is to continue on the same dose and schedule of Votrient 400 mg daily. We will repeat imaging studies in 3 months.  2. Liver function surveillance: Her LFTs remain within normal range.  3. Diarrhea prophylaxis: Diarrhea has not been an issue since the last visit.  4. Renal insufficiency: Creatinine back to her baseline at this time.  6. Hand-foot syndrome: Improved dramatically with decreasing the dose of Votrient.  7. Hip surgery: Completed in January 8478 without complications.  8. Anorexia and weight loss: Related to Votrient and recent illnesses. Megace was discontinued because of her recent thrombosis episode. Reducing the dose of Votrient did improve her symptoms slightly low continues to lose weight. I prescribed Remeron for her to take at nighttime which will help her mood as well as appetite and we'll titrate the dose from 15 mg up.  9. Follow-up: Will be in 4 weeks.    Zola Button, MD 5/1/20181:23 PM

## 2016-07-28 NOTE — Telephone Encounter (Signed)
Gave patient AVS and calender per 5/1 los.  

## 2016-07-29 LAB — URINE CULTURE

## 2016-07-30 ENCOUNTER — Other Ambulatory Visit: Payer: Self-pay | Admitting: Physician Assistant

## 2016-08-03 ENCOUNTER — Encounter: Payer: Self-pay | Admitting: Physician Assistant

## 2016-08-03 ENCOUNTER — Ambulatory Visit (INDEPENDENT_AMBULATORY_CARE_PROVIDER_SITE_OTHER): Payer: Medicare Other | Admitting: Physician Assistant

## 2016-08-03 VITALS — BP 110/66 | HR 100 | Temp 98.2°F | Resp 16 | Ht 68.0 in | Wt 189.8 lb

## 2016-08-03 DIAGNOSIS — L98499 Non-pressure chronic ulcer of skin of other sites with unspecified severity: Secondary | ICD-10-CM | POA: Diagnosis not present

## 2016-08-03 DIAGNOSIS — S31809D Unspecified open wound of unspecified buttock, subsequent encounter: Secondary | ICD-10-CM

## 2016-08-03 NOTE — Patient Instructions (Signed)
     IF you received an x-ray today, you will receive an invoice from [facility]. Please contact [facility] at [phone] with questions or concerns regarding your invoice.   IF you received labwork today, you will receive an invoice from [facility]. Please contact [facility] at [phone] with questions or concerns regarding your invoice.   Our billing staff will not be able to assist you with questions regarding bills from these companies.  You will be contacted with the lab results as soon as they are available. The fastest way to get your results is to activate your My Chart account. Instructions are located on the last page of this paperwork. If you have not heard from us regarding the results in 2 weeks, please contact this office.     

## 2016-08-03 NOTE — Progress Notes (Signed)
PRIMARY CARE AT [facility] [address] [place] [address] [phone]  Date:  08/03/2016   Name:  Laura Neal   DOB:  1950-07-06   MRN:  [id number]  PCP:  Bernerd Limbo, MD    History of Present Illness:  Laura Neal is a 66 y.o. female patient who presents to PCP with  Chief Complaint  Patient presents with  . Follow-up    one week follow up      Patient is here after 1 month of following ulcerative like lesions along the medial buttocks bilaterally.  She has been treating, along with sister, by keeping dry dressings.  --patient and sister state that it is healing ok.  The right buttock has almost completely healed.  They are keeping it dry, with dressing, and applying a thin layer to the wound.  They are using non-adhesive gauze. --no fever --no heavy drainage.   --cleaing with soap and water and keeping dry.  Patient Active Problem List   Diagnosis Date Noted  . S/P hip replacement, right 06/16/2016  . Hx of adenomatous colonic polyps 06/16/2016  . Chest pain   . Shortness of breath   . Renal cell carcinoma of right kidney ([employer])   . Malignant neoplasm metastatic to left lung ([place])   . Pulmonary embolus ([place]) 05/26/2016  . OA (osteoarthritis) of hip 04/22/2016  . Type 2 diabetes mellitus with stage 3 chronic kidney disease, without long-term current use of insulin ([place]) 12/09/2014    Past Medical History:  Diagnosis Date  . Allergy   . Anemia   . Arthritis    "it was probably in my right hip" (05/26/2016)  . Cancer of right kidney ([place]) 2000   on PO chemotherapy /notes 05/26/2016  . GERD (gastroesophageal reflux disease)   . History of blood transfusion 03/2016   "w/hip replacement"  . History of stomach ulcers 1970s  . Hyperlipidemia   . Hypertension   . Pneumonia    "it's been awhile" (05/26/2016)  . Pulmonary embolism ([place]) 05/26/2016  . Seasonal allergies   . Seizures ([place])    "when I was young" (05/26/2016), none since childhood 07-08-16  . Type II  diabetes mellitus ([place])   . Ulcer     Past Surgical History:  Procedure Laterality Date  . ABDOMINAL HYSTERECTOMY  1987   "left an ovary"  . BACK SURGERY    . COLONOSCOPY    . ECTOPIC PREGNANCY SURGERY    . JOINT REPLACEMENT    . [place] SURGERY  1990s  . NEPHRECTOMY Right 2000   for cancer  . TOTAL HIP ARTHROPLASTY Right 04/22/2016   Procedure: RIGHT TOTAL HIP ARTHROPLASTY ANTERIOR APPROACH;  Surgeon: Gaynelle Arabian, MD;  Location: [facility];  Service: Orthopedics;  Laterality: Right;  . UPPER GASTROINTESTINAL ENDOSCOPY      Social History  Substance Use Topics  . Smoking status: Former Smoker    Packs/day: 0.50    Years: 40.00    Types: Cigarettes    Quit date: 03/30/2004  . Smokeless tobacco: Former Systems developer    Types: Chew, [place]: 05/26/2016 "tried chew and snuff when I was young"  . Alcohol use No    Family History  Problem Relation Age of Onset  . Hypertension Mother   . Hypertension Father   . Diabetes Sister   . Cancer Sister   . Diabetes Brother   . Hypertension Brother   . Colon cancer Neg Hx   .  Esophageal cancer Neg Hx   . Stomach cancer Neg Hx   . Rectal cancer Neg Hx     Allergies  Allergen Reactions  . Codeine Rash    Medication list has been reviewed and updated.  Current Outpatient Prescriptions on File Prior to Visit  Medication Sig Dispense Refill  . acetaminophen (TYLENOL) 325 MG tablet Take 2 tablets (650 mg total) by mouth every 6 (six) hours as needed for mild pain (or Fever >/= 101). 10 tablet 0  . apixaban (ELIQUIS) 5 MG TABS tablet Take 1 tablet (5 mg total) by mouth 2 (two) times daily. 120 tablet 0  . aspirin EC 81 MG tablet Take 81 mg by mouth daily.    [name] BYETTA 10 MCG PEN 10 MCG/0.04ML SOPN injection Inject 10 mg into the skin 2 (two) times daily.    . fluticasone (FLONASE) 50 MCG/ACT nasal spray Place 2 sprays into the nose daily. (Patient taking differently: Place 2 sprays into the nose daily as needed for allergies. ) 16  g 6  . furosemide (LASIX) 20 MG tablet Take 20 mg by mouth daily as needed for fluid or edema.     . hydrochlorothiazide (MICROZIDE) 12.5 MG capsule Take 1 capsule (12.5 mg total) by mouth daily. 30 capsule 0  . insulin glargine (LANTUS) 100 UNIT/ML injection Inject 30 Units into the skin at bedtime.     [name] loperamide (IMODIUM) 2 MG capsule Take 2 mg by mouth as needed for diarrhea or loose stools (side effects of Votrient.).    [name] losartan (COZAAR) 50 MG tablet Take 1 tablet (50 mg total) by mouth daily. 30 tablet 0  . methocarbamol (ROBAXIN) 500 MG tablet Take 1 tablet (500 mg total) by mouth every 6 (six) hours as needed for muscle spasms. 80 tablet 0  . mirtazapine (REMERON) 15 MG tablet Take 1 tablet (15 mg total) by mouth at bedtime. 30 tablet 1  . pantoprazole (PROTONIX) 40 MG tablet Take 1 tablet (40 mg total) by mouth daily. (Patient taking differently: Take 40 mg by mouth every other day. ) 30 tablet 3  . pazopanib (VOTRIENT) 200 MG tablet Take 3 tablets (600 mg total) by mouth daily. Take on an empty stomach. 90 tablet 0  . prochlorperazine (COMPAZINE) 10 MG tablet Take 1 tablet (10 mg total) by mouth every 6 (six) hours as needed for nausea or vomiting. 30 tablet 1  . rosuvastatin (CRESTOR) 10 MG tablet Take 10 mg by mouth at bedtime.     . traMADol (ULTRAM) 50 MG tablet Take 1-2 tablets (50-100 mg total) by mouth every 6 (six) hours as needed (mild pain). 56 tablet 0   No current facility-administered medications on file prior to visit.     ROS ROS otherwise unremarkable unless listed above.  Physical Examination: BP 110/66 (BP Location: Right Arm, Patient Position: Sitting, Cuff Size: Small)   Pulse 100   Temp 98.2 F (36.8 C) (Oral)   Resp 16   Ht 5\' 8"  (1.727 m)   Wt 189 lb 12.8 oz (86.1 kg)   SpO2 97%   BMI 28.86 kg/m  Ideal Body Weight: Weight in (lb) to have BMI = 25: 164.1  Physical Exam  Constitutional: She is oriented to person, place, and time. She appears  well-developed and well-nourished. No distress.  HENT:  Head: Normocephalic and atraumatic.  Right Ear: External ear normal.  Left Ear: External ear normal.  Eyes: Conjunctivae and EOM are normal. Pupils are equal, round, and reactive  to light.  Cardiovascular: Normal rate.   Pulmonary/Chest: Effort normal. No respiratory distress.  Neurological: She is alert and oriented to person, place, and time.  Skin: Skin is warm. Capillary refill takes less than 2 seconds. No rash noted. She is not diaphoretic.  Shallow ulcerated lesions at the medial buttock bilaterally.  No drainage.   Psychiatric: She has a normal mood and affect. Her behavior is normal.     Assessment and Plan: LOVENE MARET is a 66 y.o. female who is here today for cc of buttock lesions Advised to continue changing with dry dressing.  Continue the cushioning and more ambulation and less pressure from this site.  Advised gauzing at the center of buttock, to keep the two lesions from meeting.   She and sister voiced understanding. rtc in 2 weeks  Wound of buttock, unspecified laterality, subsequent encounter  Superficial ulcerative lesion ([place])  Ivar Drape, PA-C [facility] 5/9/201811:24 AM

## 2016-08-05 NOTE — Progress Notes (Signed)
Nutrition Assessment   Reason for Assessment:   Patient identified on Malnutrition Screening Report for poor appetite and weight loss  ASSESSMENT:  66 year old female with renal cell carcinoma with lesions in lung, adrenal and possible tumor thrombus in left renal vein currently taking votrient.  Past medical history of recent hospital admission Feb 2018 DVT and PE, DM, ulcerative lesions on medial buttocks bilaterally  Spoke with patient via phone.  Reports that she has had a poor appetite for the last 2 weeks or more.  Reports no desire to eat and sometimes stomach pain.  Reports no nausea, sometimes has diarrhea but takes imodium.  Patient reports that she may eat jello for breakfast, bacon at times or sometimes egg.  Usually skips lunch and eats evening meal of potato soup or chicken noodle soup or [place] sub.   Reports ensure/boost "milky" drinks cause her diarrhea.     Medications: lasix, imodium, remeron added on 5/1  Labs: K 3.2, glucose 218, creatinine 2.0  Anthropometrics:   Height: 68 inches Weight: 189 lb 12.8 oz UBW: 200s per pt.  Noted on 5/1 weight of 192 lb, 4/30 192 lb, 4/18 194 lb BMI: 28  3% weight loss in the last month   NUTRITION DIAGNOSIS: Inadequate oral intake related to cancer and cancer related treatments as evidenced by poor appetite and 3% weight loss in the last month    INTERVENTION:   Discussed importance of good nutrition and encouraged high calorie, high protein foods.  Will mail fact sheet for patient. Discussed oral nutrition supplements and alternatives to boost/ensure "milky" options.  Contact information mailed and patient knows to contact me with questions    MONITORING, EVALUATION, GOAL: Patient will consume increased calories and protein to prevent lean muscle mass loss   NEXT VISIT: as needed  Blaine Guiffre B. Zenia Resides, Eva, [place] Registered Dietitian [phone] (pager)

## 2016-08-14 ENCOUNTER — Encounter (HOSPITAL_COMMUNITY): Payer: Self-pay | Admitting: Emergency Medicine

## 2016-08-14 ENCOUNTER — Emergency Department (HOSPITAL_COMMUNITY): Payer: Medicare Other

## 2016-08-14 ENCOUNTER — Telehealth: Payer: Self-pay

## 2016-08-14 ENCOUNTER — Other Ambulatory Visit: Payer: Self-pay | Admitting: *Deleted

## 2016-08-14 ENCOUNTER — Inpatient Hospital Stay (HOSPITAL_COMMUNITY)
Admission: EM | Admit: 2016-08-14 | Discharge: 2016-08-27 | DRG: 682 | Disposition: A | Discharge status: Home / Self Care | Payer: Medicare Other | Attending: Family Medicine | Admitting: Family Medicine

## 2016-08-14 DIAGNOSIS — L89152 Pressure ulcer of sacral region, stage 2: Secondary | ICD-10-CM | POA: Diagnosis present

## 2016-08-14 DIAGNOSIS — N183 Chronic kidney disease, stage 3 (moderate): Secondary | ICD-10-CM | POA: Diagnosis present

## 2016-08-14 DIAGNOSIS — N179 Acute kidney failure, unspecified: Principal | ICD-10-CM | POA: Diagnosis present

## 2016-08-14 DIAGNOSIS — R0602 Shortness of breath: Secondary | ICD-10-CM

## 2016-08-14 DIAGNOSIS — D62 Acute posthemorrhagic anemia: Secondary | ICD-10-CM | POA: Diagnosis not present

## 2016-08-14 DIAGNOSIS — Z7982 Long term (current) use of aspirin: Secondary | ICD-10-CM

## 2016-08-14 DIAGNOSIS — C641 Malignant neoplasm of right kidney, except renal pelvis: Secondary | ICD-10-CM | POA: Diagnosis present

## 2016-08-14 DIAGNOSIS — R609 Edema, unspecified: Secondary | ICD-10-CM | POA: Diagnosis not present

## 2016-08-14 DIAGNOSIS — Y838 Other surgical procedures as the cause of abnormal reaction of the patient, or of later complication, without mention of misadventure at the time of the procedure: Secondary | ICD-10-CM | POA: Diagnosis not present

## 2016-08-14 DIAGNOSIS — R918 Other nonspecific abnormal finding of lung field: Secondary | ICD-10-CM | POA: Diagnosis not present

## 2016-08-14 DIAGNOSIS — E440 Moderate protein-calorie malnutrition: Secondary | ICD-10-CM | POA: Diagnosis present

## 2016-08-14 DIAGNOSIS — J910 Malignant pleural effusion: Secondary | ICD-10-CM | POA: Diagnosis present

## 2016-08-14 DIAGNOSIS — R0902 Hypoxemia: Secondary | ICD-10-CM

## 2016-08-14 DIAGNOSIS — C7972 Secondary malignant neoplasm of left adrenal gland: Secondary | ICD-10-CM | POA: Diagnosis not present

## 2016-08-14 DIAGNOSIS — Z794 Long term (current) use of insulin: Secondary | ICD-10-CM | POA: Diagnosis not present

## 2016-08-14 DIAGNOSIS — R0609 Other forms of dyspnea: Secondary | ICD-10-CM | POA: Diagnosis not present

## 2016-08-14 DIAGNOSIS — I2782 Chronic pulmonary embolism: Secondary | ICD-10-CM

## 2016-08-14 DIAGNOSIS — C7802 Secondary malignant neoplasm of left lung: Secondary | ICD-10-CM | POA: Diagnosis present

## 2016-08-14 DIAGNOSIS — J9811 Atelectasis: Secondary | ICD-10-CM | POA: Diagnosis not present

## 2016-08-14 DIAGNOSIS — E875 Hyperkalemia: Secondary | ICD-10-CM | POA: Diagnosis not present

## 2016-08-14 DIAGNOSIS — Z8701 Personal history of pneumonia (recurrent): Secondary | ICD-10-CM

## 2016-08-14 DIAGNOSIS — I2699 Other pulmonary embolism without acute cor pulmonale: Secondary | ICD-10-CM | POA: Diagnosis present

## 2016-08-14 DIAGNOSIS — C7800 Secondary malignant neoplasm of unspecified lung: Secondary | ICD-10-CM | POA: Diagnosis not present

## 2016-08-14 DIAGNOSIS — J942 Hemothorax: Secondary | ICD-10-CM | POA: Diagnosis not present

## 2016-08-14 DIAGNOSIS — Z86718 Personal history of other venous thrombosis and embolism: Secondary | ICD-10-CM

## 2016-08-14 DIAGNOSIS — L8990 Pressure ulcer of unspecified site, unspecified stage: Secondary | ICD-10-CM | POA: Insufficient documentation

## 2016-08-14 DIAGNOSIS — Z86711 Personal history of pulmonary embolism: Secondary | ICD-10-CM | POA: Diagnosis not present

## 2016-08-14 DIAGNOSIS — T8119XA Other postprocedural shock, initial encounter: Secondary | ICD-10-CM | POA: Diagnosis not present

## 2016-08-14 DIAGNOSIS — C649 Malignant neoplasm of unspecified kidney, except renal pelvis: Secondary | ICD-10-CM

## 2016-08-14 DIAGNOSIS — E1122 Type 2 diabetes mellitus with diabetic chronic kidney disease: Secondary | ICD-10-CM | POA: Diagnosis present

## 2016-08-14 DIAGNOSIS — C7971 Secondary malignant neoplasm of right adrenal gland: Secondary | ICD-10-CM | POA: Diagnosis not present

## 2016-08-14 DIAGNOSIS — E11649 Type 2 diabetes mellitus with hypoglycemia without coma: Secondary | ICD-10-CM | POA: Diagnosis present

## 2016-08-14 DIAGNOSIS — Z9889 Other specified postprocedural states: Secondary | ICD-10-CM

## 2016-08-14 DIAGNOSIS — Z79899 Other long term (current) drug therapy: Secondary | ICD-10-CM | POA: Diagnosis not present

## 2016-08-14 DIAGNOSIS — J90 Pleural effusion, not elsewhere classified: Secondary | ICD-10-CM | POA: Diagnosis not present

## 2016-08-14 DIAGNOSIS — C642 Malignant neoplasm of left kidney, except renal pelvis: Secondary | ICD-10-CM | POA: Diagnosis not present

## 2016-08-14 DIAGNOSIS — R0600 Dyspnea, unspecified: Secondary | ICD-10-CM

## 2016-08-14 DIAGNOSIS — I313 Pericardial effusion (noninflammatory): Secondary | ICD-10-CM | POA: Diagnosis not present

## 2016-08-14 LAB — LACTIC ACID, PLASMA: LACTIC ACID, VENOUS: 2.1 mmol/L — AB (ref 0.5–1.9)

## 2016-08-14 LAB — CBC
HCT: 31.2 % — ABNORMAL LOW (ref 36.0–46.0)
HEMOGLOBIN: 9.4 g/dL — AB (ref 12.0–15.0)
MCH: 29.2 pg (ref 26.0–34.0)
MCHC: 30.1 g/dL (ref 30.0–36.0)
MCV: 96.9 fL (ref 78.0–100.0)
Platelets: 528 10*3/uL — ABNORMAL HIGH (ref 150–400)
RBC: 3.22 MIL/uL — ABNORMAL LOW (ref 3.87–5.11)
RDW: 19.8 % — AB (ref 11.5–15.5)
WBC: 9.7 10*3/uL (ref 4.0–10.5)

## 2016-08-14 LAB — COMPREHENSIVE METABOLIC PANEL
ALBUMIN: 2.7 g/dL — AB (ref 3.5–5.0)
ALT: 9 U/L — ABNORMAL LOW (ref 14–54)
ANION GAP: 14 (ref 5–15)
AST: 18 U/L (ref 15–41)
Alkaline Phosphatase: 92 U/L (ref 38–126)
BILIRUBIN TOTAL: 0.7 mg/dL (ref 0.3–1.2)
BUN: 34 mg/dL — AB (ref 6–20)
CHLORIDE: 101 mmol/L (ref 101–111)
CO2: 24 mmol/L (ref 22–32)
Calcium: 8.8 mg/dL — ABNORMAL LOW (ref 8.9–10.3)
Creatinine, Ser: 2.9 mg/dL — ABNORMAL HIGH (ref 0.44–1.00)
GFR calc Af Amer: 18 mL/min — ABNORMAL LOW (ref >60)
GFR calc non Af Amer: 16 mL/min — ABNORMAL LOW (ref >60)
GLUCOSE: 186 mg/dL — AB (ref 65–99)
Potassium: 3.6 mmol/L (ref 3.5–5.1)
SODIUM: 139 mmol/L (ref 135–145)
TOTAL PROTEIN: 7.4 g/dL (ref 6.5–8.1)

## 2016-08-14 LAB — URINALYSIS, ROUTINE W REFLEX MICROSCOPIC
Bilirubin Urine: NEGATIVE
GLUCOSE, UA: NEGATIVE mg/dL
HGB URINE DIPSTICK: NEGATIVE
KETONES UR: NEGATIVE mg/dL
Leukocytes, UA: NEGATIVE
NITRITE: NEGATIVE
PH: 7 (ref 5.0–8.0)
Protein, ur: NEGATIVE mg/dL
SPECIFIC GRAVITY, URINE: 1.011 (ref 1.005–1.030)

## 2016-08-14 LAB — TSH: TSH: 4.082 u[IU]/mL (ref 0.350–4.500)

## 2016-08-14 LAB — I-STAT CG4 LACTIC ACID, ED: Lactic Acid, Venous: 2.24 mmol/L (ref 0.5–1.9)

## 2016-08-14 LAB — PROTIME-INR
INR: 1.98
Prothrombin Time: 22.8 seconds — ABNORMAL HIGH (ref 11.4–15.2)

## 2016-08-14 LAB — LIPASE, BLOOD: Lipase: 24 U/L (ref 11–51)

## 2016-08-14 LAB — PROCALCITONIN: PROCALCITONIN: 1.18 ng/mL

## 2016-08-14 LAB — POC OCCULT BLOOD, ED: Fecal Occult Bld: NEGATIVE

## 2016-08-14 LAB — I-STAT TROPONIN, ED: TROPONIN I, POC: 0 ng/mL (ref 0.00–0.08)

## 2016-08-14 MED ORDER — PROCHLORPERAZINE MALEATE 10 MG PO TABS: 10.0000 mg | ORAL_TABLET | Freq: Four times a day (QID) | ORAL | 1 refills | Status: DC | PRN

## 2016-08-14 MED ORDER — SODIUM CHLORIDE 0.9% FLUSH
3.0000 mL | Freq: Two times a day (BID) | INTRAVENOUS | Status: DC
  Administered 2016-08-15 – 2016-08-27 (×20): 3 mL via INTRAVENOUS

## 2016-08-14 MED ORDER — ACETAMINOPHEN 650 MG RE SUPP: 650.0000 mg | Freq: Four times a day (QID) | RECTAL | Status: DC | PRN

## 2016-08-14 MED ORDER — ONDANSETRON HCL 4 MG PO TABS: 4.0000 mg | ORAL_TABLET | Freq: Four times a day (QID) | ORAL | Status: DC | PRN

## 2016-08-14 MED ORDER — TRAMADOL HCL 50 MG PO TABS
50.0000 mg | ORAL_TABLET | Freq: Four times a day (QID) | ORAL | Status: DC | PRN
  Administered 2016-08-15 (×2): 50 mg via ORAL
  Administered 2016-08-16: 100 mg via ORAL
  Filled 2016-08-14 (×2): qty 1
  Filled 2016-08-14: qty 2

## 2016-08-14 MED ORDER — SODIUM CHLORIDE 0.9 % IV SOLN
INTRAVENOUS | Status: DC
  Administered 2016-08-14: 100 mL/h via INTRAVENOUS
  Administered 2016-08-15: 01:00:00 via INTRAVENOUS

## 2016-08-14 MED ORDER — ROSUVASTATIN CALCIUM 10 MG PO TABS
10.0000 mg | ORAL_TABLET | Freq: Every day | ORAL | Status: DC
  Administered 2016-08-15 – 2016-08-26 (×13): 10 mg via ORAL
  Filled 2016-08-14 (×13): qty 1

## 2016-08-14 MED ORDER — MIRTAZAPINE 15 MG PO TABS
15.0000 mg | ORAL_TABLET | Freq: Every day | ORAL | Status: DC
  Administered 2016-08-15 – 2016-08-26 (×13): 15 mg via ORAL
  Filled 2016-08-14 (×7): qty 1
  Filled 2016-08-14: qty 0.5
  Filled 2016-08-14 (×6): qty 1

## 2016-08-14 MED ORDER — METHOCARBAMOL 500 MG PO TABS
500.0000 mg | ORAL_TABLET | Freq: Four times a day (QID) | ORAL | Status: DC | PRN
  Administered 2016-08-21 – 2016-08-25 (×3): 500 mg via ORAL
  Filled 2016-08-14 (×3): qty 1

## 2016-08-14 MED ORDER — ORAL CARE MOUTH RINSE
15.0000 mL | Freq: Two times a day (BID) | OROMUCOSAL | Status: DC
  Administered 2016-08-15 – 2016-08-25 (×20): 15 mL via OROMUCOSAL

## 2016-08-14 MED ORDER — SODIUM CHLORIDE 0.9 % IV BOLUS (SEPSIS)
1000.0000 mL | Freq: Once | INTRAVENOUS | Status: AC
  Administered 2016-08-14: 1000 mL via INTRAVENOUS

## 2016-08-14 MED ORDER — PAZOPANIB HCL 200 MG PO TABS: 600.0000 mg | ORAL_TABLET | Freq: Every day | ORAL | 0 refills | Status: DC

## 2016-08-14 MED ORDER — SODIUM CHLORIDE 0.9 % IV BOLUS (SEPSIS)
1000.0000 mL | Freq: Once | INTRAVENOUS | Status: AC
  Administered 2016-08-15: 1000 mL via INTRAVENOUS

## 2016-08-14 MED ORDER — ONDANSETRON HCL 4 MG/2ML IJ SOLN
4.0000 mg | Freq: Four times a day (QID) | INTRAMUSCULAR | Status: DC | PRN
  Administered 2016-08-15 – 2016-08-23 (×2): 4 mg via INTRAVENOUS
  Filled 2016-08-14 (×2): qty 2

## 2016-08-14 MED ORDER — ASPIRIN EC 81 MG PO TBEC
81.0000 mg | DELAYED_RELEASE_TABLET | Freq: Every day | ORAL | Status: DC
  Administered 2016-08-15: 81 mg via ORAL
  Filled 2016-08-14: qty 1

## 2016-08-14 MED ORDER — PANTOPRAZOLE SODIUM 40 MG PO TBEC
40.0000 mg | DELAYED_RELEASE_TABLET | ORAL | Status: DC
  Administered 2016-08-15 – 2016-08-27 (×7): 40 mg via ORAL
  Filled 2016-08-14 (×7): qty 1

## 2016-08-14 MED ORDER — INSULIN ASPART 100 UNIT/ML ~~LOC~~ SOLN
0.0000 [IU] | Freq: Three times a day (TID) | SUBCUTANEOUS | Status: DC
  Administered 2016-08-15 (×2): 1 [IU] via SUBCUTANEOUS
  Administered 2016-08-16 – 2016-08-17 (×2): 2 [IU] via SUBCUTANEOUS
  Administered 2016-08-17: 1 [IU] via SUBCUTANEOUS
  Administered 2016-08-18: 5 [IU] via SUBCUTANEOUS
  Administered 2016-08-19: 2 [IU] via SUBCUTANEOUS
  Administered 2016-08-22: 1 [IU] via SUBCUTANEOUS
  Administered 2016-08-24 – 2016-08-25 (×3): 2 [IU] via SUBCUTANEOUS
  Administered 2016-08-25 – 2016-08-27 (×3): 1 [IU] via SUBCUTANEOUS

## 2016-08-14 MED ORDER — ACETAMINOPHEN 325 MG PO TABS
650.0000 mg | ORAL_TABLET | Freq: Four times a day (QID) | ORAL | Status: DC | PRN
  Administered 2016-08-15: 650 mg via ORAL
  Filled 2016-08-14: qty 2

## 2016-08-14 MED ORDER — INSULIN GLARGINE 100 UNIT/ML ~~LOC~~ SOLN
15.0000 [IU] | Freq: Every day | SUBCUTANEOUS | Status: DC
  Administered 2016-08-15 – 2016-08-19 (×5): 15 [IU] via SUBCUTANEOUS
  Filled 2016-08-14 (×5): qty 0.15

## 2016-08-14 NOTE — Progress Notes (Signed)
ANTICOAGULATION CONSULT NOTE - Initial Consult  Pharmacy Consult for IV heparin Indication: pulmonary embolus   Allergies  Allergen Reactions  . Codeine Rash    Patient Measurements: Height: 5\' 8"  (172.7 cm) Weight: 194 lb (88 kg) IBW/kg (Calculated) : 63.9 Heparin Dosing Weight: 68 kg  Vital Signs: Temp: 98.4 F (36.9 C) (05/18 2059) Temp Source: Oral (05/18 2059) BP: 100/60 (05/18 2230) Pulse Rate: 88 (05/18 2230)  Labs:  Recent Labs  08/14/16 1635  HGB 9.4*  HCT 31.2*  PLT 528*  LABPROT 22.8*  INR 1.98  CREATININE 2.90*    Estimated Creatinine Clearance: 21.8 mL/min (A) (by C-G formula based on SCr of 2.9 mg/dL (H)).   Medical History: Past Medical History:  Diagnosis Date  . Allergy   . Anemia   . Arthritis    "it was probably in my right hip" (05/26/2016)  . Cancer of right kidney ([place]) 2000   on PO chemotherapy /notes 05/26/2016  . GERD (gastroesophageal reflux disease)   . History of blood transfusion 03/2016   "w/hip replacement"  . History of stomach ulcers 1970s  . Hyperlipidemia   . Hypertension   . Pneumonia    "it's been awhile" (05/26/2016)  . Pulmonary embolism ([place]) 05/26/2016  . Seasonal allergies   . Seizures ([place])    "when I was young" (05/26/2016), none since childhood 07-08-16  . Type II diabetes mellitus ([place])   . Ulcer     Medications:  Scheduled:  . [START ON 08/15/2016] aspirin EC  81 mg Oral Daily  . [START ON 08/15/2016] insulin aspart  0-9 Units Subcutaneous TID WC  . [START ON 08/15/2016] insulin glargine  15 Units Subcutaneous Daily  . mirtazapine  15 mg Oral QHS  . [START ON 08/15/2016] pantoprazole  40 mg Oral QODAY  . rosuvastatin  10 mg Oral QHS  . sodium chloride flush  3 mL Intravenous Q12H   Infusions:  . sodium chloride 100 mL/hr (08/14/16 2258)    Assessment: 27 yoF c/o poor appetite , intermittent lower abdominal pain, diarrhea x1 today.  On PTA Eliquis for hx of PE now needs thoracentesis.  LD Eliquis 5/18  at 0900.    IV heparin per Rx to bridge for procedure.  Baseline coags: Aptt=50, HL=>2.20, INR=1.96  Goal of Therapy:  Heparin level 0.3-0.7 units/ml Monitor platelets by anticoagulation protocol: Yes   Plan:  Baseline coags stat (HL/aPtt/PT/INR) Heparin drip at 800 units/hr (No bolus) Daily CBC Will use aptt to monitor d/t recent apixaban use. Will change to only HL when aptt and HL correlate   Dorrene German 08/14/2016,11:23 PM

## 2016-08-14 NOTE — Telephone Encounter (Signed)
S/w pt per Dr Hazeline Junker response. Pt spoke back instructions.

## 2016-08-14 NOTE — Progress Notes (Addendum)
CRITICAL VALUE ALERT  Critical value received:  Lactic acid 2.1  Date of notification:  08/14/16  Time of notification:  2340  Critical value read back:Yes.    Nurse who received alert:  Rich Fuchs  MD notified (1st page):  Donnal Debar  Time of first page:  2341  MD notified (2nd page):  Time of second page:  Responding MD:  Donnal Debar  Time MD responded:  [phone]

## 2016-08-14 NOTE — ED Notes (Signed)
Pt unable to give urine sample, but aware that we need one.  

## 2016-08-14 NOTE — H&P (Signed)
History and Physical    Laura Neal SEG:[id number] DOB: 1950/07/18 DOA: 08/14/2016  PCP: Bernerd Limbo, MD  Oncology: Alen Blew  Patient coming from: Home  Chief Complaint: Intermittent nausea, vomiting, abdominal pain with decreased PO intake  HPI: Laura Neal is a 66 y.o. 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  She was advised to hold her Votrient and report to the ED for evaluation.  She reports that her GI symptoms are intermittent and not necessarily occurring every day.  She is having one loose stool daily, but it is normal in color.  No BRBPR.  No melena.  Her emesis is clear.  No hematemesis.  She is having intermittent periumbilical and LLQ pain.  No fever.  She has had decreased PO intake for at least several weeks now and reports that appetite stimulants have not been well tolerated in the past.  She is losing weight.  She has a new cough with dyspnea on exertion.  She denies chest pain, pressure, or tightness.  No pleurisy.  ED Course: Chest xray shows moderate to large left pleural effusion; increased from prior study.  Lactic acid level 2.24.  WBC count 9.7.  Hgb 9.4.  BUN 34.  Creatinine 2.9 (Baseline 1.6). Bicarb 24.  INR 1.98. Albumin 2.7.  Negative troponin.  The patient received 1L NS in the ED.  Fecal occult blood test negative.  U/A still pending at the time of admission request.  Hospitalist asked to admit for AKI and enlarging  pleural effusion.  Review of Systems: As per HPI otherwise 10 systems reviewed and negative.   Past Medical History:  Diagnosis Date  . Allergy   . Anemia   . Arthritis    "it was probably in my right hip" (05/26/2016)  . Cancer of right kidney ([place]) 2000   on PO chemotherapy /notes 05/26/2016  . GERD (gastroesophageal reflux disease)   . History of blood transfusion 03/2016   "w/hip replacement"  . History of stomach ulcers 1970s  . Hyperlipidemia   . Hypertension   . Pneumonia    "it's been awhile" (05/26/2016)  . Pulmonary embolism ([facility]) 05/26/2016  . Seasonal allergies   . Seizures ([place])    "when I was young" (05/26/2016), none since childhood 07-08-16  . Type II diabetes mellitus ([place])   . Ulcer     Past Surgical History:  Procedure Laterality Date  . ABDOMINAL HYSTERECTOMY  1987   "left an ovary"  . BACK SURGERY    . COLONOSCOPY    . ECTOPIC PREGNANCY SURGERY    . JOINT REPLACEMENT    . [place] SURGERY  1990s  . NEPHRECTOMY Right 2000   for cancer  . TOTAL HIP ARTHROPLASTY Right 04/22/2016   Procedure: RIGHT TOTAL HIP ARTHROPLASTY ANTERIOR APPROACH;  Surgeon: Gaynelle Arabian, MD;  Location: [facility];  Service: Orthopedics;  Laterality: Right;  . UPPER GASTROINTESTINAL ENDOSCOPY       reports that she quit smoking about 12 years ago. Her smoking  use included Cigarettes. She has a 20.00 pack-year smoking history. She has quit using smokeless tobacco. Her smokeless tobacco use included Chew and Snuff. She reports that she does not drink alcohol or use drugs.  Allergies  Allergen Reactions  . Codeine Rash    Family History  Problem Relation Age of Onset  . Hypertension Mother   . Hypertension Father   . Diabetes Sister   . Cancer Sister   . Diabetes Brother   . Hypertension Brother   . Colon cancer Neg Hx   . Esophageal cancer Neg Hx   . Stomach cancer Neg Hx   . Rectal cancer Neg Hx      Prior to Admission medications   Medication Sig Start Date End  Date Taking? Authorizing Provider  acetaminophen (TYLENOL) 325 MG tablet Take 2 tablets (650 mg total) by mouth every 6 (six) hours as needed for mild pain (or Fever >/= 101). 04/27/16  Yes Perkins, Alexzandrew L, PA-C  apixaban (ELIQUIS) 5 MG TABS tablet Take 1 tablet (5 mg total) by mouth 2 (two) times daily. 06/03/16  Yes Everrett Coombe, MD  aspirin EC 81 MG tablet Take 81 mg by mouth daily.   Yes [provider]  BYETTA 10 MCG PEN 10 MCG/0.04ML SOPN injection Inject 10 mg into the skin 2 (two) times daily. 05/13/15  Yes [provider]  fluticasone (FLONASE) 50 MCG/ACT nasal spray Place 2 sprays into the nose daily. Patient taking differently: Place 2 sprays into the nose daily as needed for allergies.  11/26/12  Yes Robyn Haber, MD  furosemide (LASIX) 20 MG tablet Take 20 mg by mouth daily as needed for fluid or edema.  10/23/14  Yes [provider]  hydrochlorothiazide (MICROZIDE) 12.5 MG capsule Take 1 capsule (12.5 mg total) by mouth daily. 05/28/16  Yes Everrett Coombe, MD  insulin glargine (LANTUS) 100 UNIT/ML injection Inject 30 Units into the skin at bedtime.    Yes [provider]  loperamide (IMODIUM) 2 MG capsule Take 2 mg by mouth as needed for diarrhea or loose stools (side effects of Votrient.).   Yes [provider]  losartan (COZAAR) 50 MG tablet Take 1 tablet (50 mg total) by mouth daily. 05/28/16  Yes Everrett Coombe, MD  methocarbamol (ROBAXIN) 500 MG tablet Take 1 tablet (500 mg total) by mouth every 6 (six) hours as needed for muscle spasms. 04/27/16  Yes Perkins, Alexzandrew L, PA-C  mirtazapine (REMERON) 15 MG tablet Take 1 tablet (15 mg total) by mouth at bedtime. 07/28/16  Yes Wyatt Portela, MD  pantoprazole (PROTONIX) 40 MG tablet Take 1 tablet (40 mg total) by mouth daily. Patient taking differently: Take 40 mg by mouth every other day.  06/16/16  Yes Esterwood, Amy S, PA-C  pazopanib (VOTRIENT) 200 MG tablet Take 3 tablets (600 mg total)  by mouth daily. Take on an empty stomach. 08/14/16  Yes Wyatt Portela, MD  prochlorperazine (COMPAZINE) 10 MG tablet Take 1 tablet (10 mg total) by mouth every 6 (six) hours as needed for nausea or vomiting. 08/14/16  Yes Wyatt Portela, MD  rosuvastatin (CRESTOR) 10 MG tablet Take 10 mg by mouth at bedtime.  11/18/15  Yes [provider]  traMADol (ULTRAM) 50 MG tablet Take 1-2 tablets (50-100 mg total) by mouth every 6 (six) hours as needed (mild pain). 04/27/16  Yes Joelene Millin, PA-C    Physical Exam: Vitals:   08/14/16 1612 08/14/16 1840 08/14/16 2059  BP: (!) 85/66  105/63 106/69  Pulse: (!) 110 87 86  Resp: 18 16 (!) 21  Temp: 97.4 F (36.3 C)  98.4 F (36.9 C)  TempSrc: Oral  Oral  SpO2: 100% 98% 97%  Weight: 88 kg (194 lb)    Height: 5\' 8"  (1.727 m)        Constitutional: NAD, calm, comfortable, chronically ill appearing but she is not acutely decompensating Vitals:   08/14/16 1612 08/14/16 1840 08/14/16 2059  BP: (!) 85/66 105/63 106/69  Pulse: (!) 110 87 86  Resp: 18 16 (!) 21  Temp: 97.4 F (36.3 C)  98.4 F (36.9 C)  TempSrc: Oral  Oral  SpO2: 100% 98% 97%  Weight: 88 kg (194 lb)    Height: 5\' 8"  (1.727 m)     Eyes: pupils appear equal bilaterally, lids and conjunctivae normal ENMT: Mucous membranes are moist. Normal dentition.  Neck: normal appearance, supple, no masses Respiratory: clear to auscultation bilaterally, no wheezing, no crackles. Normal respiratory effort. No accessory muscle use.  Cardiovascular: Normal rate, regular rhythm, no murmurs / rubs / gallops. No extremity edema. 2+ pedal pulses. GI: abdomen is soft and compressible.  No distention.  She has tenderness to palpation to the left lower quadrant.  No guarding.  Bowel sounds are present. Musculoskeletal:  No joint deformity in upper and lower extremities. Good ROM, no contractures. Normal muscle tone.  Skin: no rashes, pale, cool, dry Neurologic: No focal  deficits. Psychiatric: Normal judgment and insight. Alert and oriented x 3. Normal mood.     Labs on Admission: I have personally reviewed following labs and imaging studies  CBC:  Recent Labs Lab 08/14/16 1635  WBC 9.7  HGB 9.4*  HCT 31.2*  MCV 96.9  PLT 062*   Basic Metabolic Panel:  Recent Labs Lab 08/14/16 1635  NA 139  K 3.6  CL 101  CO2 24  GLUCOSE 186*  BUN 34*  CREATININE 2.90*  CALCIUM 8.8*   GFR: Estimated Creatinine Clearance: 21.8 mL/min (A) (by C-G formula based on SCr of 2.9 mg/dL (H)). Liver Function Tests:  Recent Labs Lab 08/14/16 1635  AST 18  ALT 9*  ALKPHOS 92  BILITOT 0.7  PROT 7.4  ALBUMIN 2.7*    Recent Labs Lab 08/14/16 1635  LIPASE 24   No results for input(s): AMMONIA in the last 168 hours. Coagulation Profile:  Recent Labs Lab 08/14/16 1635  INR 1.98   Thyroid Function Tests:  Recent Labs  08/14/16 1635  TSH 4.082   Urine analysis: Pending  Sepsis Labs:  Lactic acid level 2.24  Radiological Exams on Admission: Dg Chest 2 View  Result Date: 08/14/2016 CLINICAL DATA:  PT c/o SOB upon exertion recently, as well as decrease in appetite. Pt was taken off of chemo pills today due to health reasons. Pt has cancer of right kidney. H/o PNA, Pulmonary Embolism, Type II Diabetes. Former smoker. EXAM: CHEST  2 VIEW COMPARISON:  05/26/2016 FINDINGS: Moderate to large pleural effusion obscures hemidiaphragm and left heart border, significantly increased from the prior exam. No right pleural effusion. No evidence of pneumonia or pulmonary edema. Cardiac silhouette is partly obscured but grossly normal in size. No mediastinal or hilar masses. No convincing adenopathy. No pneumothorax. Skeletal structures are intact. IMPRESSION: 1. Moderate to large left pleural effusion. This has significantly increased in size from the prior study. 2. No right pleural effusion. No evidence of pneumonia or pulmonary edema. Electronically Signed    By: Dedra Skeens.D.  On: 08/14/2016 18:05    EKG: Independently reviewed. NSR with PVCs.  Assessment/Plan Principal Problem:   AKI (acute kidney injury) ([place]) Active Problems:   Type 2 diabetes mellitus with stage 3 chronic kidney disease, without long-term current use of insulin (HCC)   Pulmonary embolus (HCC)   Malignant neoplasm metastatic to left lung (HCC)   Moderate protein-calorie malnutrition (HCC)   Pleural effusion on left   Acute kidney injury ([place])      AKI in the setting of volume depletion, GI losses and poor PO intake --Hydrate with NS --Follow up U/A to rule out infection --Repeat labs in the AM --HOLD HCTZ, losartan, and Votrient  Left pleural effusion, likely malignant --HOLD Eliquis on admission --Heparin bridge per pharmacy when timing appropriate since her DVT/PE was diagnosed less than three months ago --Will need to coordinate plans for diagnostic/therapeutic thoracentesis with IR --Supplemental oxygen as needed  Moderate protein calorie malnutrition; decreased appetite is not new.  Appetite stimulants have not worked in the past. --Nutrition consult  Type 2 DM --Full liquid diet for now, advance as tolerated --Hold [place], half dose Lantus (give in AM), sensitive sliding scale AC  History of DVT/PE --Anticoagulation as noted above  HLD --Statin  Chronic pain --Tramadol   DVT prophylaxis: Anticoagulated with Eliquis, INR 1.98 on admission. Code Status: FULL Family Communication: Sister present at bedside in the ED at time of admission. Disposition Plan: To be determined. Consults called: NONE.  Will need to notify oncology of admission.  Will need to call IR in the AM to coordinate diagnostic/therapeutic thoracentesis. Admission status: Inpatient, telemetry.  I expect this patient will need inpatient services for greater than two midnights.   TIME SPENT: 60 minutes   Eber Jones MD [facility] Pager  [phone]  If 7PM-7AM, please contact night-coverage [email or web address] Password [email or web address]  08/14/2016, 10:50 PM

## 2016-08-14 NOTE — ED Provider Notes (Signed)
[facility] DEPT Provider Note   CSN: [id number] Arrival date & time: 08/14/16  1559     History   Chief Complaint Chief Complaint  Patient presents with  . Diarrhea  . Abdominal Pain    HPI Laura Neal is a 66 y.o. female.    The history is provided by the patient, a relative and medical records.  Emesis   This is a new problem. The current episode started 2 days ago. The problem occurs 2 to 4 times per day. The problem has been gradually improving. The emesis has an appearance of stomach contents. There has been no fever. Associated symptoms include abdominal pain, chills, cough and diarrhea. Pertinent negatives include no fever, no headaches, no myalgias and no URI.    Past Medical History:  Diagnosis Date  . Allergy   . Anemia   . Arthritis    "it was probably in my right hip" (05/26/2016)  . Cancer of right kidney ([place]) 2000   on PO chemotherapy /notes 05/26/2016  . GERD (gastroesophageal reflux disease)   . History of blood transfusion 03/2016   "w/hip replacement"  . History of stomach ulcers 1970s  . Hyperlipidemia   . Hypertension   . Pneumonia    "it's been awhile" (05/26/2016)  . Pulmonary embolism ([place]) 05/26/2016  . Seasonal allergies   . Seizures ([place])    "when I was young" (05/26/2016), none since childhood 07-08-16  . Type II diabetes mellitus ([place])   . Ulcer     Patient Active Problem List   Diagnosis Date Noted  . S/P hip replacement, right 06/16/2016  . Hx of adenomatous colonic polyps 06/16/2016  . Chest pain   . Shortness of breath   . Renal cell carcinoma of right kidney ([place])   . 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 Pulmonary embolus ([place]) 05/26/2016  . OA (osteoarthritis) of hip 04/22/2016  . Type 2 diabetes mellitus with stage 3 chronic kidney disease, without long-term current use of insulin ([place]) 12/09/2014    Past Surgical History:  Procedure Laterality Date  . ABDOMINAL HYSTERECTOMY  1987   "left an ovary"   . BACK SURGERY    . COLONOSCOPY    . ECTOPIC PREGNANCY SURGERY    . JOINT REPLACEMENT    . [place] SURGERY  1990s  . NEPHRECTOMY Right 2000   for cancer  . TOTAL HIP ARTHROPLASTY Right 04/22/2016   Procedure: RIGHT TOTAL HIP ARTHROPLASTY ANTERIOR APPROACH;  Surgeon: Gaynelle Arabian, MD;  Location: [facility];  Service: Orthopedics;  Laterality: Right;  . UPPER GASTROINTESTINAL ENDOSCOPY      OB History    No data available       Home Medications    Prior to Admission medications   Medication Sig Start Date End Date Taking? Authorizing Provider  acetaminophen (TYLENOL) 325 MG tablet Take 2 tablets (650 mg total) by mouth every 6 (six) hours as needed for mild pain (or Fever >/= 101). 04/27/16  Yes Perkins, Alexzandrew L, PA-C  apixaban (ELIQUIS) 5 MG TABS tablet Take 1 tablet (5 mg total) by mouth 2 (two) times daily. 06/03/16  Yes Everrett Coombe, MD  aspirin EC 81 MG tablet Take 81 mg by mouth daily.   Yes [provider]  BYETTA 10 MCG PEN 10 MCG/0.04ML SOPN injection Inject 10 mg into the skin 2 (two) times daily. 05/13/15  Yes [provider]  fluticasone (FLONASE) 50 MCG/ACT nasal spray Place 2 sprays into the nose  daily. Patient taking differently: Place 2 sprays into the nose daily as needed for allergies.  11/26/12  Yes Robyn Haber, MD  furosemide (LASIX) 20 MG tablet Take 20 mg by mouth daily as needed for fluid or edema.  10/23/14  Yes [provider]  hydrochlorothiazide (MICROZIDE) 12.5 MG capsule Take 1 capsule (12.5 mg total) by mouth daily. 05/28/16  Yes Everrett Coombe, MD  insulin glargine (LANTUS) 100 UNIT/ML injection Inject 30 Units into the skin at bedtime.    Yes [provider]  loperamide (IMODIUM) 2 MG capsule Take 2 mg by mouth as needed for diarrhea or loose stools (side effects of Votrient.).   Yes [provider]  losartan (COZAAR) 50 MG tablet Take 1 tablet (50 mg total) by mouth daily. 05/28/16  Yes Everrett Coombe, MD    methocarbamol (ROBAXIN) 500 MG tablet Take 1 tablet (500 mg total) by mouth every 6 (six) hours as needed for muscle spasms. 04/27/16  Yes Perkins, Alexzandrew L, PA-C  mirtazapine (REMERON) 15 MG tablet Take 1 tablet (15 mg total) by mouth at bedtime. 07/28/16  Yes Wyatt Portela, MD  pantoprazole (PROTONIX) 40 MG tablet Take 1 tablet (40 mg total) by mouth daily. Patient taking differently: Take 40 mg by mouth every other day.  06/16/16  Yes Esterwood, Amy S, PA-C  pazopanib (VOTRIENT) 200 MG tablet Take 3 tablets (600 mg total) by mouth daily. Take on an empty stomach. 08/14/16  Yes Wyatt Portela, MD  prochlorperazine (COMPAZINE) 10 MG tablet Take 1 tablet (10 mg total) by mouth every 6 (six) hours as needed for nausea or vomiting. 08/14/16  Yes Wyatt Portela, MD  rosuvastatin (CRESTOR) 10 MG tablet Take 10 mg by mouth at bedtime.  11/18/15  Yes [provider]  traMADol (ULTRAM) 50 MG tablet Take 1-2 tablets (50-100 mg total) by mouth every 6 (six) hours as needed (mild pain). 04/27/16  Yes Perkins, Alexzandrew L, PA-C    Family History Family History  Problem Relation Age of Onset  . Hypertension Mother   . Hypertension Father   . Diabetes Sister   . Cancer Sister   . Diabetes Brother   . Hypertension Brother   . Colon cancer Neg Hx   . Esophageal cancer Neg Hx   . Stomach cancer Neg Hx   . Rectal cancer Neg Hx     Social History Social History  Substance Use Topics  . Smoking status: Former Smoker    Packs/day: 0.50    Years: 40.00    Types: Cigarettes    Quit date: 03/30/2004  . Smokeless tobacco: Former Systems developer    Types: Chew, [place]: 05/26/2016 "tried chew and snuff when I was young"  . Alcohol use No     Allergies   Codeine   Review of Systems Review of Systems  Constitutional: Positive for appetite change, chills and fatigue. Negative for diaphoresis and fever.  HENT: Negative for congestion and rhinorrhea.   Eyes: Negative for visual  disturbance.  Respiratory: Positive for cough and shortness of breath. Negative for chest tightness, wheezing and stridor.   Cardiovascular: Negative for chest pain, palpitations and leg swelling.  Gastrointestinal: Positive for abdominal pain, diarrhea, nausea and vomiting. Negative for constipation and rectal pain.  Genitourinary: Positive for decreased urine volume. Negative for dysuria and flank pain.  Musculoskeletal: Positive for back pain. Negative for myalgias, neck pain and neck stiffness.  Skin: Positive for wound (on back). Negative for rash.  Neurological: Negative for weakness, light-headedness, numbness and headaches.  Psychiatric/Behavioral: Negative for agitation.     Physical Exam Updated Vital Signs BP (!) 85/66 (BP Location: Right Arm)   Pulse (!) 110   Temp 97.4 F (36.3 C) (Oral)   Resp 18   Ht 5\' 8"  (1.727 m)   Wt 194 lb (88 kg)   SpO2 100%   BMI 29.50 kg/m   Physical Exam  Constitutional: She is oriented to person, place, and time. She appears well-developed and well-nourished. No distress.  HENT:  Head: Normocephalic and atraumatic.  Mouth/Throat: Oropharynx is clear and moist. No oropharyngeal exudate.  Eyes: Conjunctivae and EOM are normal. Pupils are equal, round, and reactive to light.  Neck: Normal range of motion. Neck supple.  Cardiovascular: Normal rate and regular rhythm.   No murmur heard. Pulmonary/Chest: Effort normal. No stridor. No respiratory distress. She has no wheezes. She has rales. She exhibits no tenderness.  Abdominal: Soft. Normal appearance. There is no tenderness. There is no rigidity, no rebound, no guarding and no CVA tenderness.  Genitourinary: Rectal exam shows external hemorrhoid. Rectal exam shows no fissure and no tenderness.     Musculoskeletal: She exhibits no edema or tenderness.  Neurological: She is alert and oriented to person, place, and time. No sensory deficit. She exhibits normal muscle tone.  Skin: Skin is  warm and dry. Capillary refill takes less than 2 seconds. Rash noted. She is not diaphoretic. No erythema. There is pallor.  Psychiatric: She has a normal mood and affect.  Nursing note and vitals reviewed.    ED Treatments / Results  Labs (all labs ordered are listed, but only abnormal results are displayed) Labs Reviewed  COMPREHENSIVE METABOLIC PANEL - Abnormal; Notable for the following:       Result Value   Glucose, Bld 186 (*)    BUN 34 (*)    Creatinine, Ser 2.90 (*)    Calcium 8.8 (*)    Albumin 2.7 (*)    ALT 9 (*)    GFR calc non Af Amer 16 (*)    GFR calc Af Amer 18 (*)    All other components within normal limits  CBC - Abnormal; Notable for the following:    RBC 3.22 (*)    Hemoglobin 9.4 (*)    HCT 31.2 (*)    RDW 19.8 (*)    Platelets 528 (*)    All other components within normal limits  PROTIME-INR - Abnormal; Notable for the following:    Prothrombin Time 22.8 (*)    All other components within normal limits  LACTIC ACID, PLASMA - Abnormal; Notable for the following:    Lactic Acid, Venous 2.1 (*)    All other components within normal limits  APTT - Abnormal; Notable for the following:    aPTT 50 (*)    All other components within normal limits  PROTIME-INR - Abnormal; Notable for the following:    Prothrombin Time 22.6 (*)    All other components within normal limits  I-STAT CG4 LACTIC ACID, ED - Abnormal; Notable for the following:    Lactic Acid, Venous 2.24 (*)    All other components within normal limits  URINE CULTURE  LIPASE, BLOOD  URINALYSIS, ROUTINE W REFLEX MICROSCOPIC  TSH  PROCALCITONIN  PROTIME-INR  CBC  BASIC METABOLIC PANEL  HEPARIN LEVEL (UNFRACTIONATED)  LACTIC ACID, PLASMA  POC OCCULT BLOOD, ED  Randolm Idol, ED    EKG  EKG Interpretation  Date/Time:  Friday  Aug 14 2016 17:38:37 EDT Ventricular Rate:  93 PR Interval:    QRS Duration: 39 QT Interval:  401 QTC Calculation: 499 R Axis:   52 Text Interpretation:   Sinus rhythm Ventricular premature complex Low voltage, extremity and precordial leads Abnormal R-wave progression, early transition Borderline ST elevation, inferior leads Borderline prolonged QT interval When compared to prior, new PVC.  No STEMI Confirmed by Antony Blackbird [phone]) on 08/15/2016 1:46:29 AM       Radiology Dg Chest 2 View  Result Date: 08/14/2016 CLINICAL DATA:  PT c/o SOB upon exertion recently, as well as decrease in appetite. Pt was taken off of chemo pills today due to health reasons. Pt has cancer of right kidney. H/o PNA, Pulmonary Embolism, Type II Diabetes. Former smoker. EXAM: CHEST  2 VIEW COMPARISON:  05/26/2016 FINDINGS: Moderate to large pleural effusion obscures hemidiaphragm and left heart border, significantly increased from the prior exam. No right pleural effusion. No evidence of pneumonia or pulmonary edema. Cardiac silhouette is partly obscured but grossly normal in size. No mediastinal or hilar masses. No convincing adenopathy. No pneumothorax. Skeletal structures are intact. IMPRESSION: 1. Moderate to large left pleural effusion. This has significantly increased in size from the prior study. 2. No right pleural effusion. No evidence of pneumonia or pulmonary edema. Electronically Signed   By: Lajean Manes M.D.   On: 08/14/2016 18:05    Procedures Procedures (including critical care time)  Medications Ordered in ED Medications  0.9 %  sodium chloride infusion ( Intravenous New Bag/Given 08/15/16 0112)  mirtazapine (REMERON) tablet 15 mg (15 mg Oral Given 08/15/16 0013)  pantoprazole (PROTONIX) EC tablet 40 mg (not administered)  aspirin EC tablet 81 mg (not administered)  methocarbamol (ROBAXIN) tablet 500 mg (not administered)  traMADol (ULTRAM) tablet 50-100 mg (not administered)  rosuvastatin (CRESTOR) tablet 10 mg (10 mg Oral Given 08/15/16 0013)  insulin glargine (LANTUS) injection 15 Units (not administered)  sodium chloride flush (NS) 0.9 % injection  3 mL (3 mLs Intravenous Not Given 08/15/16 0014)  acetaminophen (TYLENOL) tablet 650 mg (not administered)    Or  acetaminophen (TYLENOL) suppository 650 mg (not administered)  ondansetron (ZOFRAN) tablet 4 mg ( Oral See Alternative 08/15/16 0019)    Or  ondansetron (ZOFRAN) injection 4 mg (4 mg Intravenous Given 08/15/16 0019)  insulin aspart (novoLOG) injection 0-9 Units (not administered)  MEDLINE mouth rinse (15 mLs Mouth Rinse Given 08/15/16 0013)  sodium chloride 0.9 % bolus 1,000 mL (0 mLs Intravenous Stopped 08/14/16 2023)  sodium chloride 0.9 % bolus 1,000 mL (0 mLs Intravenous Stopped 08/15/16 0112)     Initial Impression / Assessment and Plan / ED Course  I have reviewed the triage vital signs and the nursing notes.  Pertinent labs & imaging results that were available during my care of the patient were reviewed by me and considered in my medical decision making (see chart for details).     Laura Neal is a 66 y.o. female with a past medical history significant for hypertension, hyperlipidemia, prior pulmonary embolism on L Lucas therapy, diabetes, and renal cancer on chemotherapy who presents with severe fatigue, malaise, chills, dry cough, shortness of breath, nausea, vomiting, decreased urination,  abdominal pain, and diarrhea. Patient has come in by family reports that for the last week or 2, she has had worsening symptoms. She is feeling very fatigued and can barely get around. She said her last 5 days, she has had some shortness of breath with exertion.  She denies chest pain. She says when she had her PE she had significant chest pains and she does not feel this is related to a blood clot. She reports that she has a history of GI bleeds and has had intermittent dark stools for the last few months. She is on blood thinners. She says that her abdominal pain is a 3 out of 10 and is a achy pain all over. She denies any focal pain. She reports nausea and vomiting for the last 2 days.  She denies hematemesis.  she denies dysuria but does report decreased urination. She denies any other symptoms aside from her severe fatigue.  History and exam are seen above. On exam, patient has no significant abdominal tenderness. No CVA tenderness. Lungs have crackles in the bases. Patient has some pressure ulcers in her sacral area that do not appear infected. Rectal exam was performed. Fecal occult test was sent. No focal neurologic deficits. Patient appears pale. Patient's chest was nontender.  Patient will have workup to look for etiology of symptoms. Suspect dehydration in the setting of the decreased intake, nausea, vomiting, and diarrhea. Patient was hypotensive in triage with a blood pressure of 85 systolic. Patient was also tachycardic.  Patient will have workup to look for occult infection given the chills, decreased urine, and a dry cough. Anticipate reassessment following workup.  Diagnostic testing results are seen above. Hemoglobin 9.4. Fecal occult test negative. Do not feel patient is acutely having a GI bleed. Creatinine elevated at 2.9, patient has a cat. Electrolytes otherwise similar to prior.  Suspect dehydration causing AK I. Patient also found to have a large pleural effusion likely leading to her shortness of breath. This may be malignant in nature given her cancer.  Given these findings, patient will be admitted for further management. Patient's blood pressure improved after some fluids. Heart rate also improved. Patient admitted for further management of her AKI, rehydration, and her pleural effusion.    Final Clinical Impressions(s) / ED Diagnoses   Final diagnoses:  Pleural effusion  AKI (acute kidney injury) ([place])    Clinical Impression: 1. Pleural effusion   2. AKI (acute kidney injury) ([place])     Disposition: Admit to hospsitalist service    Malin Cervini, Gwenyth Allegra, MD 08/15/16 [phone]

## 2016-08-14 NOTE — Telephone Encounter (Signed)
Pt called that she can't eat, food won't stay down on stomach. Asking if she needs to come in to be checked or go to ER?  Pt is on votrient.  Cannot eat, does not have an appetite. The medicine she was given to improve appetite has not helped.  If tries to eat or drink it comes back up, sometimes. Trouble with stomach hurting. Hurts more when she tries to eat.  Stomach pain has been getting worse. Sometimes she does feel better when she takes compazine. she uses compazine about twice a day. Endoscopy 07/08/16 was fine per pt. LBM 5/17 soft. No fever. No breathing issues. Poor energy. Blood sugars have been low, her insulin is on a sliding scale.

## 2016-08-14 NOTE — Telephone Encounter (Signed)
Please advise her to stop Votrient and go to the emergency department if she is experiencing vomiting.

## 2016-08-14 NOTE — ED Notes (Signed)
Attempted lab draw but unsuccessful. RN,Jeneen made aware. 

## 2016-08-14 NOTE — ED Notes (Signed)
Dr. At bedside 

## 2016-08-14 NOTE — ED Triage Notes (Addendum)
Pt from home with complaints of poor appetite, intermittent lower abdominal pain, diarrhea (1 episode today), and emesis (none today) x 2 days. Pt has hx of kidney cancer and takes oral chemo pills. Pt states her doctor told her to not take chemo today. Pt rates her pain at 3/10 in her lower abdomen. Pt was hypotensive in triage, but is not now that she is in a treatment room. Pt's blood pressure is 105/65. Pt is alert and oriented x 4.  Pt also reports she has skin break down on her bottom and is being evaluated by her PCP for this, but is currently in pain from that. Pt states she would like to be evaluated for this here

## 2016-08-14 NOTE — ED Notes (Signed)
Attempted to get blood, but was unsuccessful. 

## 2016-08-15 ENCOUNTER — Inpatient Hospital Stay (HOSPITAL_COMMUNITY): Payer: Medicare Other

## 2016-08-15 LAB — BASIC METABOLIC PANEL
ANION GAP: 10 (ref 5–15)
BUN: 30 mg/dL — AB (ref 6–20)
CHLORIDE: 106 mmol/L (ref 101–111)
CO2: 24 mmol/L (ref 22–32)
Calcium: 7.9 mg/dL — ABNORMAL LOW (ref 8.9–10.3)
Creatinine, Ser: 2.46 mg/dL — ABNORMAL HIGH (ref 0.44–1.00)
GFR calc Af Amer: 22 mL/min — ABNORMAL LOW (ref >60)
GFR calc non Af Amer: 19 mL/min — ABNORMAL LOW (ref >60)
GLUCOSE: 130 mg/dL — AB (ref 65–99)
Potassium: 3.7 mmol/L (ref 3.5–5.1)
Sodium: 140 mmol/L (ref 135–145)

## 2016-08-15 LAB — APTT
APTT: 145 s — AB (ref 24–36)
APTT: 142 s — AB (ref 24–36)
APTT: 50 s — AB (ref 24–36)

## 2016-08-15 LAB — CBC
HEMATOCRIT: 28.9 % — AB (ref 36.0–46.0)
HEMOGLOBIN: 8.7 g/dL — AB (ref 12.0–15.0)
MCH: 29.5 pg (ref 26.0–34.0)
MCHC: 30.1 g/dL (ref 30.0–36.0)
MCV: 98 fL (ref 78.0–100.0)
Platelets: 465 10*3/uL — ABNORMAL HIGH (ref 150–400)
RBC: 2.95 MIL/uL — AB (ref 3.87–5.11)
RDW: 19.9 % — ABNORMAL HIGH (ref 11.5–15.5)
WBC: 7.7 10*3/uL (ref 4.0–10.5)

## 2016-08-15 LAB — BODY FLUID CELL COUNT WITH DIFFERENTIAL
EOS FL: 0 %
Lymphs, Fluid: 36 %
MONOCYTE-MACROPHAGE-SEROUS FLUID: 23 % — AB (ref 50–90)
Neutrophil Count, Fluid: 41 % — ABNORMAL HIGH (ref 0–25)
Other Cells, Fluid: 3 %
Total Nucleated Cell Count, Fluid: 522 cu mm (ref 0–1000)

## 2016-08-15 LAB — GLUCOSE, CAPILLARY
GLUCOSE-CAPILLARY: 125 mg/dL — AB (ref 65–99)
GLUCOSE-CAPILLARY: 143 mg/dL — AB (ref 65–99)
GLUCOSE-CAPILLARY: 78 mg/dL (ref 65–99)
Glucose-Capillary: 70 mg/dL (ref 65–99)
Glucose-Capillary: 66 mg/dL (ref 65–99)

## 2016-08-15 LAB — HEPARIN LEVEL (UNFRACTIONATED): Heparin Unfractionated: >2.2 IU/mL — ABNORMAL HIGH (ref 0.30–0.70)

## 2016-08-15 LAB — PROTIME-INR
INR: 1.83
INR: 1.96
PROTHROMBIN TIME: 21.4 s — AB (ref 11.4–15.2)
PROTHROMBIN TIME: 22.6 s — AB (ref 11.4–15.2)

## 2016-08-15 LAB — LACTIC ACID, PLASMA: Lactic Acid, Venous: 1.2 mmol/L (ref 0.5–1.9)

## 2016-08-15 LAB — PROTEIN, PLEURAL OR PERITONEAL FLUID: Total protein, fluid: 3.5 g/dL

## 2016-08-15 LAB — LACTATE DEHYDROGENASE, PLEURAL OR PERITONEAL FLUID: LD FL: 567 U/L — AB (ref 3–23)

## 2016-08-15 MED ORDER — HEPARIN (PORCINE) IN NACL 100-0.45 UNIT/ML-% IJ SOLN
600.0000 [IU]/h | INTRAMUSCULAR | Status: DC
  Administered 2016-08-15: 600 [IU]/h via INTRAVENOUS

## 2016-08-15 MED ORDER — HEPARIN (PORCINE) IN NACL 100-0.45 UNIT/ML-% IJ SOLN: 400.0000 [IU]/h | INTRAMUSCULAR | Status: DC

## 2016-08-15 MED ORDER — HEPARIN (PORCINE) IN NACL 100-0.45 UNIT/ML-% IJ SOLN
800.0000 [IU]/h | INTRAMUSCULAR | Status: DC
  Administered 2016-08-15: 800 [IU]/h via INTRAVENOUS
  Filled 2016-08-15: qty 250

## 2016-08-15 NOTE — Progress Notes (Signed)
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 Codeine Rash    Patient Measurements: Height: 5\' 8"  (172.7 cm) Weight: 182 lb 1.6 oz (82.6 kg) IBW/kg (Calculated) : 63.9 Heparin Dosing Weight: 80.7 kg  Vital Signs: Temp: 98.3 F (36.8 C) (05/19 0526) Temp Source: Oral (05/19 0526) BP: 104/55 (05/19 0526) Pulse Rate: 87 (05/19 0526)  Labs:  Recent Labs  08/14/16 1635 08/14/16 2336 08/15/16 0201 08/15/16 1018  HGB 9.4*  --  8.7*  --   HCT 31.2*  --  28.9*  --   PLT 528*  --  465*  --   APTT  --  50*  --  145*  LABPROT 22.8* 22.6* 21.4*  --   INR 1.98 1.96 1.83  --   HEPARINUNFRC  --  >2.20*  --   --   CREATININE 2.90*  --  2.46*  --     Estimated Creatinine Clearance: 25 mL/min (A) (by C-G formula based on SCr of 2.46 mg/dL (H)).   Medical History: Past Medical History:  Diagnosis Date  . Allergy   . Anemia   . Arthritis    "it was probably in my right hip" (05/26/2016)  . Cancer of right kidney ([place]) 2000   on PO chemotherapy /notes 05/26/2016  . GERD (gastroesophageal reflux disease)   . History of blood transfusion 03/2016   "w/hip replacement"  . History of stomach ulcers 1970s  . Hyperlipidemia   . Hypertension   . Pneumonia    "it's been awhile" (05/26/2016)  . Pulmonary embolism ([place]) 05/26/2016  . Seasonal allergies   . Seizures ([place])    "when I was young" (05/26/2016), none since childhood 07-08-16  . Type II diabetes mellitus ([place])   . Ulcer     Medications:  Scheduled:  . aspirin EC  81 mg Oral Daily  . insulin aspart  0-9 Units Subcutaneous TID WC  . insulin glargine  15 Units Subcutaneous Daily  . mouth rinse  15 mL Mouth Rinse BID  . mirtazapine  15 mg Oral QHS  . pantoprazole  40 mg Oral QODAY  . rosuvastatin  10 mg Oral QHS  . sodium chloride flush  3 mL Intravenous Q12H   Infusions:  . sodium chloride  100 mL/hr at 08/15/16 0112  . heparin      Assessment: 62 yoF with c/o poor appetite, intermittent lower abdominal pain, diarrhea x1 today. On Apixaban PTA for hx of DVT/PE who now needs thoracentesis.  Last dose of Apixaban reported as 08/14/2016 at 0900. Pharmacy consulted to assist with dosing of IV heparin infusion while Apixaban on hold for procedure.     Baseline coags: aPTT = 50 seconds, HL > 2.2, PT/INR = 22.6/1.96  Today, 08/15/16:  1018 heparin level > 2.2 (as expected since Apixaban can falsely elevate heparin level)  1018 aPTT supratherapeutic at 145 seconds  CBC: Hgb decreased to 8.7, Pltc elevated  No bleeding issues reported per nursing  RN confirms that lab collected blood from opposite arm from where heparin infusion running  Goal of Therapy:  Heparin level 0.3-0.7 units/ml  aPTT 66-102 seconds Monitor platelets by anticoagulation protocol: Yes   Plan:   Hold heparin infusion x 1 hour  Resume heparin infusion at lower rate of 600 units/hr  Check aPTT 8 hours after heparin infusion resumed  Daily CBC, heparin level  Will use aPTT to  adjust heparin infusion at this time due to recent Apixaban use. Will change to using heparin levels only when heparin level and aPTT correlate.   F/u timing of thoracentesis.  Discussed above plan with RN.  Lindell Spar, PharmD, BCPS Pager: [phone] 08/15/2016 11:57 AM

## 2016-08-15 NOTE — Progress Notes (Signed)
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 Patient reported symptoms to her oncologist who recommended to stop her medications and report to the emergency department for an operation. In the ED she was found to be dehydrated with creatinine of 2.9 BUN of 34. Chest x-ray was done which showed moderate to large pleural effusion worsened from prior studies and a lactic acid level of 2.24. Patient admitted for IV hydration and further evaluation.  Subjective: Patient seen and examined, she is status post thoracentesis, report mild pain at the surgical site. No other complaints at this time. Tolerating diet well with no nausea or vomiting.  Assessment & Plan: Acute kidney failure on chronic kidney disease stage III. In view of volume depletion and poor oral anicteric. Baseline creatinine about 1.5, creatinine upon admission 2.90 Continue IV fluids Encourage oral hydration Holding HCTZ, losartan and Votrient  Repeat BMP in a.m.  Left pleural effusion, likely secondary to malignancy. IR consulted for thoracentesis  Patient is status post ultrasound-guided thoracentesis Fluid analysis pending  Type 2 diabetes mellitus Holding home medications Continue SSI and Lantus half dose Monitor CBG  History of DVT/PE Eliquis was placed on hold for procedure Patient on heparin drip, will stop tomorrow and resume Eliquis assuming that hemoglobin remained stable  Moderate protein caloric malnutrition due to chronic illness PT consult Dietitian recommendations appreciated  DVT prophylaxis: Heparin drip Code Status: Full code Family Communication: sister at  bedside Disposition Plan: Home likely in the next 24/48 hours.  Consultants:   Interventional radiology  Procedures:   Ultrasound-guided thoracentesis 08/15/2016  Antimicrobials: Anti-infectives    None        Objective: Vitals:   08/14/16 2230 08/14/16 2334 08/15/16 0526 08/15/16 1435  BP: 100/60 (!) 99/56 (!) 104/55 (!) 95/49  Pulse: 88 88 87 84  Resp: 20 12 17 18   Temp:  98.5 F (36.9 C) 98.3 F (36.8 C) 98 F (36.7 C)  TempSrc:  Oral Oral Oral  SpO2: 94% 96% 96% 98%  Weight:  82.6 kg (182 lb 1.6 oz)    Height:  5\' 8"  (1.727 m)      Intake/Output Summary (Last 24 hours) at 08/15/16 1551 Last data filed at 08/15/16 1438  Gross per 24 hour  Intake          3098.06 ml  Output              100 ml  Net          2998.06 ml   Filed Weights   08/14/16 1612 08/14/16 2334  Weight: 88 kg (194 lb) 82.6 kg (182 lb 1.6 oz)    Examination:  General exam: Appears calm and comfortable  HEENT: AC/AT, PERRLA, OP moist and clear Respiratory system: Decreased breath sounds at the left base, mild scattered crackles in the left side. Cardiovascular system: S1 & S2 heard, RRR. No JVD, 2/6 systolic murmur Gastrointestinal system: Abdomen is nondistended, soft and nontender. No organomegaly or masses felt. Normal bowel sounds heard. Central nervous system: Alert and oriented. No focal neurological deficits. Extremities: No pedal edema. Symmetric, strength 5/5   Skin: No rashes, lesions or ulcers Psychiatry: Judgement and insight appear normal. Mood &  affect appropriate.    Data Reviewed: I have personally reviewed following labs and imaging studies  CBC:  Recent Labs Lab 08/14/16 1635 08/15/16 0201  WBC 9.7 7.7  HGB 9.4* 8.7*  HCT 31.2* 28.9*  MCV 96.9 98.0  PLT 528* 563*   Basic Metabolic Panel:  Recent Labs Lab 08/14/16 1635 08/15/16 0201  NA 139 140  K 3.6 3.7  CL 101 106  CO2 24 24  GLUCOSE 186* 130*  BUN 34* 30*  CREATININE 2.90* 2.46*  CALCIUM 8.8*  7.9*   GFR: Estimated Creatinine Clearance: 25 mL/min (A) (by C-G formula based on SCr of 2.46 mg/dL (H)). Liver Function Tests:  Recent Labs Lab 08/14/16 1635  AST 18  ALT 9*  ALKPHOS 92  BILITOT 0.7  PROT 7.4  ALBUMIN 2.7*    Recent Labs Lab 08/14/16 1635  LIPASE 24   No results for input(s): AMMONIA in the last 168 hours. Coagulation Profile:  Recent Labs Lab 08/14/16 1635 08/14/16 2336 08/15/16 0201  INR 1.98 1.96 1.83   Cardiac Enzymes: No results for input(s): CKTOTAL, CKMB, CKMBINDEX, TROPONINI in the last 168 hours. BNP (last 3 results) No results for input(s): PROBNP in the last 8760 hours. HbA1C: No results for input(s): HGBA1C in the last 72 hours. CBG:  Recent Labs Lab 08/15/16 0835 08/15/16 1210  GLUCAP 125* 143*   Lipid Profile: No results for input(s): CHOL, HDL, LDLCALC, TRIG, CHOLHDL, LDLDIRECT in the last 72 hours. Thyroid Function Tests:  Recent Labs  08/14/16 1635  TSH 4.082   Anemia Panel: No results for input(s): VITAMINB12, FOLATE, FERRITIN, TIBC, IRON, RETICCTPCT in the last 72 hours. Sepsis Labs:  Recent Labs Lab 08/14/16 1748 08/14/16 2254 08/14/16 2255 08/15/16 0201  PROCALCITON  --  1.18  --   --   LATICACIDVEN 2.24*  --  2.1* 1.2    No results found for this or any previous visit (from the past 240 hour(s)).       Radiology Studies: Dg Chest 1 View  Result Date: 08/15/2016 CLINICAL DATA:  Status post left thoracentesis. EXAM: CHEST 1 VIEW COMPARISON:  Aug 14, 2016 FINDINGS: The left-sided pleural effusion remains but is smaller. No pneumothorax. No other changes. IMPRESSION: Decreased size of left-sided pleural effusion after thoracentesis. No pneumothorax. Electronically Signed   By: Dorise Bullion III M.D   On: 08/15/2016 14:09   Dg Chest 2 View  Result Date: 08/14/2016 CLINICAL DATA:  PT c/o SOB upon exertion recently, as well as decrease in appetite. Pt was taken off of chemo pills today due to health  reasons. Pt has cancer of right kidney. H/o PNA, Pulmonary Embolism, Type II Diabetes. Former smoker. EXAM: CHEST  2 VIEW COMPARISON:  05/26/2016 FINDINGS: Moderate to large pleural effusion obscures hemidiaphragm and left heart border, significantly increased from the prior exam. No right pleural effusion. No evidence of pneumonia or pulmonary edema. Cardiac silhouette is partly obscured but grossly normal in size. No mediastinal or hilar masses. No convincing adenopathy. No pneumothorax. Skeletal structures are intact. IMPRESSION: 1. Moderate to large left pleural effusion. This has significantly increased in size from the prior study. 2. No right pleural effusion. No evidence of pneumonia or pulmonary edema. Electronically Signed   By: Lajean Manes M.D.   On: 08/14/2016 18:05   US Thoracentesis Asp Pleural Space W/img Guide  Result Date: 08/15/2016 INDICATION: SOB upon exertion. Cancer of the right kidney. Left pleural effusion. EXAM: ULTRASOUND GUIDED LEFT THORACENTESIS MEDICATIONS: 1% Lidocaine = 10 mL.  COMPLICATIONS: None immediate. PROCEDURE: An ultrasound guided thoracentesis was thoroughly discussed with the patient and questions answered. The benefits, risks, alternatives and complications were also discussed. The patient understands and wishes to proceed with the procedure. Written consent was obtained. Ultrasound was performed to localize and mark an adequate pocket of fluid in the left chest. The area was then prepped and draped in the normal sterile fashion. 1% Lidocaine was used for local anesthesia. Under ultrasound guidance a catheter was introduced. Thoracentesis was performed. The catheter was removed and a dressing applied. FINDINGS: A total of approximately 2 liters of bloody fluid was removed. Samples were sent to the laboratory as requested by the clinical team. IMPRESSION: Successful ultrasound guided left thoracentesis yielding 2 liters of pleural fluid. Post procedure radiograph shows  no pneumothorax. Read by:  Gareth Eagle, PA-C Electronically Signed   By: Lucrezia Europe M.D.   On: 08/15/2016 13:52      Scheduled Meds: . aspirin EC  81 mg Oral Daily  . insulin aspart  0-9 Units Subcutaneous TID WC  . insulin glargine  15 Units Subcutaneous Daily  . mouth rinse  15 mL Mouth Rinse BID  . mirtazapine  15 mg Oral QHS  . pantoprazole  40 mg Oral QODAY  . rosuvastatin  10 mg Oral QHS  . sodium chloride flush  3 mL Intravenous Q12H   Continuous Infusions: . sodium chloride 100 mL/hr at 08/15/16 0112  . heparin 600 Units/hr (08/15/16 1251)     LOS: 1 day    Chipper Oman, MD Pager: Text Page via [email or web address]  [phone]  If 7PM-7AM, please contact night-coverage [email or web address] Password [facility] 08/15/2016, 3:51 PM

## 2016-08-15 NOTE — Progress Notes (Signed)
Hypoglycemic Event  CBG: 66  Treatment: OJ   Symptoms: N/A  Follow-up CBG: Time:2130 CBG Result:78  Possible Reasons for Event: Poor appetite   Comments/MD notified:    Rosine Beat

## 2016-08-15 NOTE — Progress Notes (Signed)
ANTICOAGULATION CONSULT NOTE - [place] for IV heparin (on apixaban PTA) Indication: history of DVT/PE (04/2016)   Allergies  Allergen Reactions  . Codeine Rash    Patient Measurements: Height: 5\' 8"  (172.7 cm) Weight: 182 lb 1.6 oz (82.6 kg) IBW/kg (Calculated) : 63.9 Heparin Dosing Weight: 80.7 kg  Vital Signs: Temp: 97.4 F (36.3 C) (05/19 2036) Temp Source: Oral (05/19 2036) BP: 96/49 (05/19 2036) Pulse Rate: 95 (05/19 2036)  Labs:  Recent Labs  08/14/16 1635 08/14/16 2336 08/15/16 0201 08/15/16 1018 08/15/16 2230  HGB 9.4*  --  8.7*  --   --   HCT 31.2*  --  28.9*  --   --   PLT 528*  --  465*  --   --   APTT  --  50*  --  145* 142*  LABPROT 22.8* 22.6* 21.4*  --   --   INR 1.98 1.96 1.83  --   --   HEPARINUNFRC  --  >2.20*  --  >2.20*  --   CREATININE 2.90*  --  2.46*  --   --     Estimated Creatinine Clearance: 25 mL/min (A) (by C-G formula based on SCr of 2.46 mg/dL (H)).   Medical History: Past Medical History:  Diagnosis Date  . Allergy   . Anemia   . Arthritis    "it was probably in my right hip" (05/26/2016)  . Cancer of right kidney ([place]) 2000   on PO chemotherapy /notes 05/26/2016  . GERD (gastroesophageal reflux disease)   . History of blood transfusion 03/2016   "w/hip replacement"  . History of stomach ulcers 1970s  . Hyperlipidemia   . Hypertension   . Pneumonia    "it's been awhile" (05/26/2016)  . Pulmonary embolism ([place]) 05/26/2016  . Seasonal allergies   . Seizures ([place])    "when I was young" (05/26/2016), none since childhood 07-08-16  . Type II diabetes mellitus ([place])   . Ulcer     Medications:  Scheduled:  . aspirin EC  81 mg Oral Daily  . insulin aspart  0-9 Units Subcutaneous TID WC  . insulin glargine  15 Units Subcutaneous Daily  . mouth rinse  15 mL Mouth Rinse BID  . mirtazapine  15 mg Oral QHS  . pantoprazole  40 mg Oral QODAY  . rosuvastatin  10 mg Oral QHS  . sodium chloride flush  3  mL Intravenous Q12H   Infusions:  . sodium chloride 75 mL/hr at 08/15/16 1904  . [START ON 08/16/2016] heparin      Assessment: 60 yoF with c/o poor appetite, intermittent lower abdominal pain, diarrhea x1 today. On Apixaban PTA for hx of DVT/PE who now needs thoracentesis.  Last dose of Apixaban reported as 08/14/2016 at 0900. Pharmacy consulted to assist with dosing of IV heparin infusion while Apixaban on hold for procedure.     Baseline coags: aPTT = 50 seconds, HL > 2.2, PT/INR = 22.6/1.96  Today, 08/15/16:  1018 heparin level > 2.2 (as expected since Apixaban can falsely elevate heparin level)  1018 aPTT supratherapeutic at 145 seconds  CBC: Hgb decreased to 8.7, Pltc elevated  No bleeding issues reported per nursing  RN confirms that lab collected blood from opposite arm from where heparin infusion running  2230 aptt=142 sec above goal, no bleeding or infusion issues per RN  Goal of Therapy:  Heparin level 0.3-0.7 units/ml  aPTT 66-102 seconds Monitor platelets by anticoagulation protocol: Yes  Plan:   Hold heparin infusion x 1 hour  Resume heparin infusion at lower rate of 400 units/hr   Check aPTT and HL 8 hours after heparin infusion resumed  Daily CBC, heparin level  Will use aPTT to adjust heparin infusion at this time due to recent Apixaban use. Will change to using heparin levels only when heparin level and aPTT correlate.   F/u timing of thoracentesis.  Discussed above plan with RN.   Dorrene German 08/15/2016 11:57 AM

## 2016-08-15 NOTE — Procedures (Signed)
PROCEDURE SUMMARY:  Successful US guided left thoracentesis. Yielded 2 liters of bloody fluid. Pt tolerated procedure well. No immediate complications.  Specimen was sent for labs. CXR ordered.  Andreina Outten S Tonya Wantz PA-C 08/15/2016 1:53 PM

## 2016-08-16 ENCOUNTER — Inpatient Hospital Stay (HOSPITAL_COMMUNITY): Payer: Medicare Other

## 2016-08-16 DIAGNOSIS — N179 Acute kidney failure, unspecified: Principal | ICD-10-CM

## 2016-08-16 LAB — BASIC METABOLIC PANEL
Anion gap: 10 (ref 5–15)
BUN: 27 mg/dL — ABNORMAL HIGH (ref 6–20)
CHLORIDE: 107 mmol/L (ref 101–111)
CO2: 20 mmol/L — AB (ref 22–32)
Calcium: 7.9 mg/dL — ABNORMAL LOW (ref 8.9–10.3)
Creatinine, Ser: 2.32 mg/dL — ABNORMAL HIGH (ref 0.44–1.00)
GFR calc non Af Amer: 21 mL/min — ABNORMAL LOW (ref >60)
GFR, EST AFRICAN AMERICAN: 24 mL/min — AB (ref >60)
Glucose, Bld: 108 mg/dL — ABNORMAL HIGH (ref 65–99)
Potassium: 4.6 mmol/L (ref 3.5–5.1)
Sodium: 137 mmol/L (ref 135–145)

## 2016-08-16 LAB — HEMOGLOBIN AND HEMATOCRIT, BLOOD
HEMATOCRIT: 22 % — AB (ref 36.0–46.0)
HEMOGLOBIN: 7.2 g/dL — AB (ref 12.0–15.0)

## 2016-08-16 LAB — CBC
HCT: 21.8 % — ABNORMAL LOW (ref 36.0–46.0)
HEMOGLOBIN: 6.3 g/dL — AB (ref 12.0–15.0)
MCH: 28.4 pg (ref 26.0–34.0)
MCHC: 28.9 g/dL — ABNORMAL LOW (ref 30.0–36.0)
MCV: 98.2 fL (ref 78.0–100.0)
Platelets: 594 10*3/uL — ABNORMAL HIGH (ref 150–400)
RBC: 2.22 MIL/uL — AB (ref 3.87–5.11)
RDW: 20.4 % — ABNORMAL HIGH (ref 11.5–15.5)
WBC: 11.8 10*3/uL — AB (ref 4.0–10.5)

## 2016-08-16 LAB — GLUCOSE, CAPILLARY
GLUCOSE-CAPILLARY: 160 mg/dL — AB (ref 65–99)
Glucose-Capillary: 99 mg/dL (ref 65–99)
Glucose-Capillary: 107 mg/dL — ABNORMAL HIGH (ref 65–99)
Glucose-Capillary: 166 mg/dL — ABNORMAL HIGH (ref 65–99)

## 2016-08-16 LAB — GRAM STAIN

## 2016-08-16 LAB — URINE CULTURE: CULTURE: NO GROWTH

## 2016-08-16 LAB — PREPARE RBC (CROSSMATCH)

## 2016-08-16 LAB — MRSA PCR SCREENING: MRSA BY PCR: NEGATIVE

## 2016-08-16 MED ORDER — SODIUM CHLORIDE 0.9 % IV SOLN
Freq: Once | INTRAVENOUS | Status: AC
  Administered 2016-08-16: 09:00:00 via INTRAVENOUS

## 2016-08-16 MED ORDER — TRAMADOL HCL 50 MG PO TABS
50.0000 mg | ORAL_TABLET | Freq: Four times a day (QID) | ORAL | Status: DC | PRN
  Administered 2016-08-16 – 2016-08-27 (×11): 50 mg via ORAL
  Filled 2016-08-16 (×11): qty 1

## 2016-08-16 MED ORDER — BOOST / RESOURCE BREEZE PO LIQD
1.0000 | Freq: Three times a day (TID) | ORAL | Status: DC
  Administered 2016-08-16 – 2016-08-25 (×18): 1 via ORAL

## 2016-08-16 MED ORDER — SODIUM CHLORIDE 0.9 % IV BOLUS (SEPSIS)
1000.0000 mL | Freq: Once | INTRAVENOUS | Status: AC
  Administered 2016-08-16: 1000 mL via INTRAVENOUS

## 2016-08-16 NOTE — Progress Notes (Signed)
Received pt from 1417. VSS.

## 2016-08-16 NOTE — Progress Notes (Signed)
Initial Nutrition Assessment  DOCUMENTATION CODES:   Severe malnutrition in context of chronic illness  INTERVENTION:   -Provide Boost Breeze po TID, each supplement provides 250 kcal and 9 grams of protein -Encourage PO intake -RD to continue to monitor  NUTRITION DIAGNOSIS:   Malnutrition related to chronic illness, cancer and cancer related treatments as evidenced by percent weight loss, energy intake < or equal to 75% for > or equal to 1 month.  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 Patient reported symptoms to her oncologist who recommended to stop her medications and report to the emergency department for an operation. In the ED she was found to be dehydrated with creatinine of 2.9 BUN of 34. Chest x-ray was done which showed moderate to large pleural effusion worsened from prior studies and a lactic acid level of 2.24. Patient admitted for IV hydration and further evaluation.  Patient in room with family member at bedside. Pt reports her appetite has continued to have poor appetite and eats snacks twice a day (cottage cheese & peaches, cereal and fruit, etc.) Pt was having N/V for about a week PTA. Per chart review, pt spoke with a RD at [facility] who provided high calorie, high protein diet education for patient. Pt reports that she cannot tolerate Ensure/Boost supplements. She is wanting to try Boost Breeze since they are clear, will order TID.  Since admission, pt has been consuming ~50-75% of meals. Pt ate cheerios and fresh  fruit cup for breakfast this morning. Pt denies nausea today.  Per chart review, pt has lost 18 lb since 4/11 (9% wt loss x 1.5 months, significant for time frame). Nutrition focused physical exam shows no sign of depletion of muscle mass or body fat.  Medications: Remeron tablet daily, Protonix every other day  Labs reviewed: GFR: 24  Diet Order:  Diet heart healthy/carb modified Room service appropriate? Yes; Fluid consistency: Thin  Skin:  Wound (see comment) (Stage II sacral ulcer)  Last BM:  5/18  Height:   Ht Readings from Last 1 Encounters:  08/14/16 5\' 8"  (1.727 m)    Weight:   Wt Readings from Last 1 Encounters:  08/14/16 182 lb 1.6 oz (82.6 kg)    Ideal Body Weight:  63.6 kg  BMI:  Body mass index is 27.69 kg/m.  Estimated Nutritional Needs:   Kcal:  2000-2200  Protein:  95-105g  Fluid:  2L/day  EDUCATION NEEDS:   No education needs identified at this time  Clayton Bibles, MS, RD, LDN Pager: [phone] After Hours Pager: [phone]

## 2016-08-16 NOTE — Progress Notes (Signed)
Back from CT

## 2016-08-16 NOTE — Progress Notes (Signed)
PT Cancellation Note  Patient Details Name: Laura Neal MRN: [id number] DOB: 05-18-1950   Cancelled Treatment:    Reason Eval/Treat Not Completed: Medical issues which prohibited therapy; Hgb  6.3; will attempt again next day or as schedule permits   [facility] 08/16/2016, 12:43 PM

## 2016-08-16 NOTE — Progress Notes (Signed)
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 Patient reported symptoms to her oncologist who recommended to stop her medications and report to the emergency department for an operation. In the ED she was found to be dehydrated with creatinine of 2.9 BUN of 34. Chest x-ray was done which showed moderate to large pleural effusion worsened from prior studies and a lactic acid level of 2.24. Patient admitted for IV hydration and further evaluation.  Subjective: Patient seen and examined. Patient is calm and comfortable. Hgb and BP down today. Only complaint is mild pain at the procedure site. No acute events overnight.   Assessment & Plan: Acute kidney failure on chronic kidney disease stage III. In view of volume depletion and poor oral appetite. Cr improved today  Hold IVF, as CXR seem with mild overload Baseline creatinine about 1.5, creatinine upon admission 2.90 Encourage oral hydration Holding HCTZ, losartan and Votrient  Repeat BMP in AM   Left pleural effusion, likely secondary to malignancy. Tap was bloody  Patient is status post ultrasound-guided thoracentesis Effusion recurrent, ? If hemothorax  Fluid analysis pending PCCM consulted - will see patient   Acute blood loss anemia ? Hemothorax from traumatic tap, in view of anticoagulation  CT was ordered and shows persistent effusion  Transfused 1 unit PRBC's D/c heparin and ASA  Hypotension - due blood loss  NS bolus given  Holding fluid given signs of mild overload in CXR  Transfer to ICU   Type 2 diabetes mellitus Holding home medications Continue SSI and Lantus half dose Monitor  CBG  History of DVT/PE Eliquis was placed on hold for procedure A/c on hold due to possible bleed   Moderate protein caloric malnutrition due to chronic illness PT consult Dietitian recommendations appreciated  DVT prophylaxis: Heparin drip Code Status: Full code Family Communication: sister at bedside Disposition Plan: Transfer to SDU   Consultants:   Interventional radiology  Procedures:   Ultrasound-guided thoracentesis 08/15/2016  Antimicrobials: Anti-infectives    None       Objective: Vitals:   08/16/16 0619 08/16/16 1115 08/16/16 1145 08/16/16 1505  BP: (!) 87/63 (!) 103/49 108/60 (!) 107/55  Pulse: 93 93 (!) 102 91  Resp: 18 18 18 18   Temp: 98.4 F (36.9 C) 98.2 F (36.8 C) 98.1 F (36.7 C) 97.8 F (36.6 C)  TempSrc: Oral Oral Oral Oral  SpO2: 93% 93% 94% 95%  Weight:      Height:        Intake/Output Summary (Last 24 hours) at 08/16/16 1636 Last data filed at 08/16/16 1540  Gross per 24 hour  Intake          2539.94 ml  Output              400 ml  Net          2139.94 ml   Filed Weights   08/14/16 1612 08/14/16 2334  Weight: 88 kg (194 lb) 82.6 kg (182 lb 1.6 oz)    Examination:  General exam: NAD  Respiratory system: Decrease breath sound in the left, Mild crackle at b/l bases  Cardiovascular system: C9O7 RRR + 2/6 systolic murmur Gastrointestinal system: Abd soft  NTND Central nervous system: AAOx3  Extremities: No LE edema  Skin: No lesions Psychiatry: Mood and affect flat    Data Reviewed: I have personally reviewed following labs and imaging studies  CBC:  Recent Labs Lab 08/14/16 1635 08/15/16 0201 08/16/16 0507  WBC 9.7 7.7 11.8*  HGB 9.4* 8.7* 6.3*  HCT 31.2* 28.9* 21.8*  MCV 96.9 98.0 98.2  PLT 528* 465* 810*   Basic Metabolic Panel:  Recent Labs Lab 08/14/16 1635 08/15/16 0201 08/16/16 0507  NA 139 140 137  K 3.6 3.7 4.6  CL 101 106 107  CO2 24 24 20*  GLUCOSE 186* 130* 108*  BUN 34* 30* 27*   CREATININE 2.90* 2.46* 2.32*  CALCIUM 8.8* 7.9* 7.9*   GFR: Estimated Creatinine Clearance: 26.5 mL/min (A) (by C-G formula based on SCr of 2.32 mg/dL (H)). Liver Function Tests:  Recent Labs Lab 08/14/16 1635  AST 18  ALT 9*  ALKPHOS 92  BILITOT 0.7  PROT 7.4  ALBUMIN 2.7*    Recent Labs Lab 08/14/16 1635  LIPASE 24   No results for input(s): AMMONIA in the last 168 hours. Coagulation Profile:  Recent Labs Lab 08/14/16 1635 08/14/16 2336 08/15/16 0201  INR 1.98 1.96 1.83   Cardiac Enzymes: No results for input(s): CKTOTAL, CKMB, CKMBINDEX, TROPONINI in the last 168 hours. BNP (last 3 results) No results for input(s): PROBNP in the last 8760 hours. HbA1C: No results for input(s): HGBA1C in the last 72 hours. CBG:  Recent Labs Lab 08/15/16 2106 08/15/16 2135 08/16/16 0742 08/16/16 1134 08/16/16 1625  GLUCAP 66 78 99 107* 160*   Lipid Profile: No results for input(s): CHOL, HDL, LDLCALC, TRIG, CHOLHDL, LDLDIRECT in the last 72 hours. Thyroid Function Tests:  Recent Labs  08/14/16 1635  TSH 4.082   Anemia Panel: No results for input(s): VITAMINB12, FOLATE, FERRITIN, TIBC, IRON, RETICCTPCT in the last 72 hours. Sepsis Labs:  Recent Labs Lab 08/14/16 1748 08/14/16 2254 08/14/16 2255 08/15/16 0201  PROCALCITON  --  1.18  --   --   LATICACIDVEN 2.24*  --  2.1* 1.2    Recent Results (from the past 240 hour(s))  Urine culture     Status: None   Collection Time: 08/14/16 10:55 PM  Result Value Ref Range Status   Specimen Description URINE, RANDOM  Final   Special Requests NONE  Final   Culture   Final    NO GROWTH Performed at [facility], [place] [address] [place], [place] [address]    Report Status 08/16/2016 FINAL  Final  Culture, body fluid-bottle     Status: None (Preliminary result)   Collection Time: 08/15/16  1:40 PM  Result Value Ref Range Status   Specimen Description PLEURAL LEFT  Final   Special Requests BOTTLES DRAWN  AEROBIC AND ANAEROBIC  Final   Culture   Final    NO GROWTH < 24 HOURS Performed at [facility], [place] [address] [place], [place] [address]    Report Status PENDING  Incomplete  Gram stain     Status: None   Collection Time: 08/15/16  1:40 PM  Result Value Ref Range Status   Specimen Description PLEURAL LEFT  Final   Special Requests NONE  Final   Gram Stain   Final    FEW WBC PRESENT,BOTH PMN AND MONONUCLEAR NO ORGANISMS SEEN Performed at [facility], [address] [place], [place] [address]    Report Status 08/16/2016 FINAL  Final  Radiology Studies: Dg Chest 1 View  Result Date: 08/15/2016 CLINICAL DATA:  Status post left thoracentesis. EXAM: CHEST 1 VIEW COMPARISON:  Aug 14, 2016 FINDINGS: The left-sided pleural effusion remains but is smaller. No pneumothorax. No other changes. IMPRESSION: Decreased size of left-sided pleural effusion after thoracentesis. No pneumothorax. Electronically Signed   By: Dorise Bullion III M.D   On: 08/15/2016 14:09   Dg Chest 2 View  Result Date: 08/14/2016 CLINICAL DATA:  PT c/o SOB upon exertion recently, as well as decrease in appetite. Pt was taken off of chemo pills today due to health reasons. Pt has cancer of right kidney. H/o PNA, Pulmonary Embolism, Type II Diabetes. Former smoker. EXAM: CHEST  2 VIEW COMPARISON:  05/26/2016 FINDINGS: Moderate to large pleural effusion obscures hemidiaphragm and left heart border, significantly increased from the prior exam. No right pleural effusion. No evidence of pneumonia or pulmonary edema. Cardiac silhouette is partly obscured but grossly normal in size. No mediastinal or hilar masses. No convincing adenopathy. No pneumothorax. Skeletal structures are intact. IMPRESSION: 1. Moderate to large left pleural effusion. This has significantly increased in size from the prior study. 2. No right pleural effusion. No evidence of pneumonia or pulmonary edema. Electronically Signed   By:  Lajean Manes M.D.   On: 08/14/2016 18:05   Ct Chest Wo Contrast  Result Date: 08/16/2016 CLINICAL DATA:  Drop in hemoglobin post thoracentesis. EXAM: CT CHEST WITHOUT CONTRAST TECHNIQUE: Multidetector CT imaging of the chest was performed following the standard protocol without IV contrast. COMPARISON:  06/17/2016 FINDINGS: Cardiovascular: Extensive aortic and coronary atheromatous calcifications. No aneurysm. Small pericardial effusion. Mediastinum/Nodes: 18 mm enlarging left suprahilar node, previously 14 mm. 1 cm pretracheal node, previously 7 mm. No axillary adenopathy. Lungs/Pleura: No pneumothorax. Small layering right pleural effusion. Moderately large hyperdense residual/recurrent left pleural effusion probably hemorrhagic. compressive atelectasis throughout the basilar segments of left lower lobe and inferior lingula. Bilateral pulmonary nodules with slight progression. Index right lower lobe nodule 19 mm image 99/5, previously 17 mm. Posterior 13 mm left upper lobe nodule image 50/5, previously 11 mm by my measurement. Additional smaller nodules bilaterally, several cavitary. Upper Abdomen: Previous right nephrectomy. Mixed attenuation left renal mass, incompletely visualized. Progressive inflammatory/edematous changes around pancreatic head and body, incompletely seen. Stable left adrenal enlargement. Musculoskeletal: Negative for fracture. IMPRESSION: 1. Moderately large  residual/recurrent hemorrhagic left effusion. 2. Progression of bilateral pulmonary nodules and mediastinal adenopathy. 3. Small pericardial and right pleural effusions. 4. New peripancreatic inflammatory/edematous changes suggesting pancreatitis. 5. Aortic and coronary atherosclerosis. 6. Left renal and left adrenal masses again noted. Electronically Signed   By: Lucrezia Europe M.D.   On: 08/16/2016 10:46   Dg Chest Port 1 View  Result Date: 08/16/2016 CLINICAL DATA:  Pleural effusion EXAM: PORTABLE CHEST 1 VIEW COMPARISON:   08/15/2016 FINDINGS: Moderate left pleural effusion again noted with left lower lobe atelectasis. Increasing left upper lobe airspace disease. Right basilar atelectasis or infiltrate. Mild cardiomegaly. IMPRESSION: Continued moderate left pleural effusion with left lower lobe atelectasis or infiltrate and right basilar opacity. Increasing left upper lobe airspace opacity. Electronically Signed   By: Rolm Baptise M.D.   On: 08/16/2016 08:43   US Thoracentesis Asp Pleural Space W/img Guide  Result Date: 08/15/2016 INDICATION: SOB upon exertion. Cancer of the right kidney. Left pleural effusion. EXAM: ULTRASOUND GUIDED LEFT THORACENTESIS MEDICATIONS: 1% Lidocaine = 10 mL. COMPLICATIONS: None immediate. PROCEDURE: An ultrasound guided thoracentesis was thoroughly discussed with the patient and questions answered. The benefits,  risks, alternatives and complications were also discussed. The patient understands and wishes to proceed with the procedure. Written consent was obtained. Ultrasound was performed to localize and mark an adequate pocket of fluid in the left chest. The area was then prepped and draped in the normal sterile fashion. 1% Lidocaine was used for local anesthesia. Under ultrasound guidance a catheter was introduced. Thoracentesis was performed. The catheter was removed and a dressing applied. FINDINGS: A total of approximately 2 liters of bloody fluid was removed. Samples were sent to the laboratory as requested by the clinical team. IMPRESSION: Successful ultrasound guided left thoracentesis yielding 2 liters of pleural fluid. Post procedure radiograph shows no pneumothorax. Read by:  Gareth Eagle, PA-C Electronically Signed   By: Lucrezia Europe M.D.   On: 08/15/2016 13:52      Scheduled Meds: . feeding supplement  1 Container Oral TID BM  . insulin aspart  0-9 Units Subcutaneous TID WC  . insulin glargine  15 Units Subcutaneous Daily  . mouth rinse  15 mL Mouth Rinse BID  . mirtazapine  15 mg  Oral QHS  . pantoprazole  40 mg Oral QODAY  . rosuvastatin  10 mg Oral QHS  . sodium chloride flush  3 mL Intravenous Q12H   Continuous Infusions:    LOS: 2 days    Chipper Oman, MD Pager: Text Page via [email or web address]  [phone]  If 7PM-7AM, please contact night-coverage [email or web address] Password [facility] 08/16/2016, 4:36 PM

## 2016-08-16 NOTE — Progress Notes (Signed)
To CT

## 2016-08-16 NOTE — Progress Notes (Signed)
Discussed w/ Dr Quincy Simmonds voided only once today.Blood completed and tol well ns stopped per order will tx to SDU per Dr Quincy Simmonds.

## 2016-08-16 NOTE — Consult Note (Addendum)
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 Admitted 5/18 w/ cc: ~ 1 week h/o N/V and abd discomfort. In ER found to be dehydrated, renal fxn had decline and found to have large to mod sized left effusion. Her NOAC was held. Placed on heparin gtt and then she underwent left thoracentesis on 5/19. 2 liters of bloody fluid was evacuated fluid thus far exudate & cytology remains pending. On 5/20 am hgb had drifted from 8.7 to 6.3 BP as low as 80s. Imaging by CXR and CT suggesting either re-occurrence vs reaccumulation of left effusion. PCCM asked to evaluate   SIGNIFICANT EVENTS  5/18 admitted 5/19 left thoracentesis 2 liters. Exudate. Red/turbid.   STUDIES:   CT chest 5/20: 1. Moderately large  residual/recurrent hemorrhagic left effusion. 2. Progression of bilateral pulmonary nodules and mediastinal adenopathy. 3. Small pericardial and right pleural effusions. 4. New peripancreatic inflammatory/edematous changes suggesting pancreatitis. 5. Aortic and coronary atherosclerosis. 6. Left renal and left adrenal masses again noted.  PAST MEDICAL HISTORY :   has a past medical history of Allergy; Anemia; Arthritis; Cancer of right kidney ([place]) (2000); GERD (gastroesophageal reflux disease); History of blood transfusion (03/2016); History of stomach ulcers (1970s); Hyperlipidemia; Hypertension; Pneumonia; Pulmonary embolism ([place]) (05/26/2016); Seasonal allergies; Seizures ([place]); Type II diabetes mellitus ([place]); and Ulcer.  has a past surgical history that includes Ectopic pregnancy surgery; Nephrectomy (Right, 2000); Back surgery; Total hip arthroplasty (Right,  04/22/2016); Joint replacement; Lumbar disc surgery (1990s); Abdominal hysterectomy (1987); Upper gastrointestinal endoscopy; and Colonoscopy. Prior to Admission medications   Medication Sig Start Date End Date Taking? Authorizing Provider  acetaminophen (TYLENOL) 325 MG tablet Take 2 tablets (650 mg total) by mouth every 6 (six) hours as needed for mild pain (or Fever >/= 101). 04/27/16  Yes Perkins, Alexzandrew L, PA-C  apixaban (ELIQUIS) 5 MG TABS tablet Take 1 tablet (5 mg total) by mouth 2 (two) times daily. 06/03/16  Yes Everrett Coombe, MD  aspirin EC 81 MG tablet Take 81 mg by mouth daily.   Yes [provider]  BYETTA 10 MCG PEN 10 MCG/0.04ML SOPN injection Inject 10 mg into the skin 2 (two) times daily. 05/13/15  Yes [provider]  fluticasone (FLONASE) 50 MCG/ACT nasal spray Place 2 sprays into the nose daily. Patient taking differently: Place 2 sprays into the nose daily as needed for allergies.  11/26/12  Yes Robyn Haber, MD  furosemide (LASIX) 20 MG tablet Take 20 mg by mouth daily as needed for fluid or edema.  10/23/14  Yes [provider]  hydrochlorothiazide (MICROZIDE) 12.5 MG capsule Take 1 capsule (12.5 mg total) by mouth daily. 05/28/16  Yes Everrett Coombe, MD  insulin glargine (LANTUS) 100 UNIT/ML injection Inject 30 Units into the skin at bedtime.    Yes [provider]  loperamide (IMODIUM) 2 MG capsule Take 2 mg by mouth as needed for diarrhea or loose stools (side effects of Votrient.).   Yes [provider]  losartan (COZAAR) 50 MG tablet Take 1 tablet (50 mg total) by mouth daily. 05/28/16  Yes Everrett Coombe, MD  methocarbamol (  ROBAXIN) 500 MG tablet Take 1 tablet (500 mg total) by mouth every 6 (six) hours as needed for muscle spasms. 04/27/16  Yes Perkins, Alexzandrew L, PA-C  mirtazapine (REMERON) 15 MG tablet Take 1 tablet (15 mg total) by mouth at bedtime. 07/28/16  Yes Wyatt Portela, MD  pantoprazole (PROTONIX) 40 MG tablet Take 1  tablet (40 mg total) by mouth daily. Patient taking differently: Take 40 mg by mouth every other day.  06/16/16  Yes Esterwood, Amy S, PA-C  pazopanib (VOTRIENT) 200 MG tablet Take 3 tablets (600 mg total) by mouth daily. Take on an empty stomach. 08/14/16  Yes Wyatt Portela, MD  prochlorperazine (COMPAZINE) 10 MG tablet Take 1 tablet (10 mg total) by mouth every 6 (six) hours as needed for nausea or vomiting. 08/14/16  Yes Wyatt Portela, MD  rosuvastatin (CRESTOR) 10 MG tablet Take 10 mg by mouth at bedtime.  11/18/15  Yes [provider]  traMADol (ULTRAM) 50 MG tablet Take 1-2 tablets (50-100 mg total) by mouth every 6 (six) hours as needed (mild pain). 04/27/16  Yes Perkins, Alexzandrew L, PA-C   Allergies  Allergen Reactions  . Codeine Rash    FAMILY HISTORY:  family history includes Cancer in her sister; Diabetes in her brother and sister; Hypertension in her brother, father, and mother. SOCIAL HISTORY:  reports that she quit smoking about 12 years ago. Her smoking use included Cigarettes. She has a 20.00 pack-year smoking history. She has quit using smokeless tobacco. Her smokeless tobacco use included Chew and Snuff. She reports that she does not drink alcohol or use drugs.  REVIEW OF SYSTEMS BOLDS Positive    Constitutional: Negative for fever, chills, weight loss, malaise/fatigue and diaphoresis.  HENT: Negative for hearing loss, ear pain, nosebleeds, congestion, sore throat, neck pain, tinnitus and ear discharge.   Eyes: Negative for blurred vision, double vision, photophobia, pain, discharge and redness.  Respiratory: Negative for cough, hemoptysis, sputum production, shortness of breath, wheezing and stridor.   Cardiovascular: Negative for chest pain, palpitations, orthopnea, claudication, leg swelling and PND.  Gastrointestinal: Negative for heartburn, nausea, vomiting, abdominal pain, diarrhea, constipation, blood in stool and melena.  Genitourinary: Negative for  dysuria, urgency, frequency, hematuria and flank pain.  Musculoskeletal: Negative for myalgias, back pain, joint pain and falls.  Skin: Negative for itching and rash.  Neurological: Negative for dizziness, tingling, tremors, sensory change, speech change, focal weakness, seizures, loss of consciousness, weakness and headaches.  Endo/Heme/Allergies: Negative for environmental allergies and polydipsia. Does not bruise/bleed easily.  SUBJECTIVE:   VITAL SIGNS: Temp:  [97.4 F (36.3 C)-98.4 F (36.9 C)] 97.8 F (36.6 C) (05/20 1505) Pulse Rate:  [91-102] 91 (05/20 1505) Resp:  [18] 18 (05/20 1505) BP: (87-108)/(49-63) 107/55 (05/20 1505) SpO2:  [93 %-95 %] 95 % (05/20 1505)  PHYSICAL EXAMINATION: Blood pressure (!) 107/55, pulse 91, temperature 97.8 F (36.6 C), temperature source Oral, resp. rate 18, height 5\' 8"  (1.727 m), weight 182 lb 1.6 oz (82.6 kg), SpO2 95 %. Gen:      No acute distress HEENT:  EOMI, sclera anicteric Neck:     No masses; no thyromegaly Lungs:    Reduced BS at bases; normal respiratory effort CV:         Regular rate and rhythm; no murmurs Abd:      + bowel sounds; soft, non-tender; no palpable masses, no distension Ext:    No edema; adequate peripheral perfusion Skin:      Warm and  dry; no rash Neuro: alert and oriented x 3 Psych: normal mood and affect   Recent Labs Lab 08/14/16 1635 08/15/16 0201 08/16/16 0507  NA 139 140 137  K 3.6 3.7 4.6  CL 101 106 107  CO2 24 24 20*  BUN 34* 30* 27*  CREATININE 2.90* 2.46* 2.32*  GLUCOSE 186* 130* 108*    Recent Labs Lab 08/14/16 1635 08/15/16 0201 08/16/16 0507  HGB 9.4* 8.7* 6.3*  HCT 31.2* 28.9* 21.8*  WBC 9.7 7.7 11.8*  PLT 528* 465* 594*   Dg Chest 1 View  Result Date: 08/15/2016 CLINICAL DATA:  Status post left thoracentesis. EXAM: CHEST 1 VIEW COMPARISON:  Aug 14, 2016 FINDINGS: The left-sided pleural effusion remains but is smaller. No pneumothorax. No other changes. IMPRESSION: Decreased  size of left-sided pleural effusion after thoracentesis. No pneumothorax. Electronically Signed   By: Dorise Bullion III M.D   On: 08/15/2016 14:09   Dg Chest 2 View  Result Date: 08/14/2016 CLINICAL DATA:  PT c/o SOB upon exertion recently, as well as decrease in appetite. Pt was taken off of chemo pills today due to health reasons. Pt has cancer of right kidney. H/o PNA, Pulmonary Embolism, Type II Diabetes. Former smoker. EXAM: CHEST  2 VIEW COMPARISON:  05/26/2016 FINDINGS: Moderate to large pleural effusion obscures hemidiaphragm and left heart border, significantly increased from the prior exam. No right pleural effusion. No evidence of pneumonia or pulmonary edema. Cardiac silhouette is partly obscured but grossly normal in size. No mediastinal or hilar masses. No convincing adenopathy. No pneumothorax. Skeletal structures are intact. IMPRESSION: 1. Moderate to large left pleural effusion. This has significantly increased in size from the prior study. 2. No right pleural effusion. No evidence of pneumonia or pulmonary edema. Electronically Signed   By: Lajean Manes M.D.   On: 08/14/2016 18:05   Ct Chest Wo Contrast  Result Date: 08/16/2016 CLINICAL DATA:  Drop in hemoglobin post thoracentesis. EXAM: CT CHEST WITHOUT CONTRAST TECHNIQUE: Multidetector CT imaging of the chest was performed following the standard protocol without IV contrast. COMPARISON:  06/17/2016 FINDINGS: Cardiovascular: Extensive aortic and coronary atheromatous calcifications. No aneurysm. Small pericardial effusion. Mediastinum/Nodes: 18 mm enlarging left suprahilar node, previously 14 mm. 1 cm pretracheal node, previously 7 mm. No axillary adenopathy. Lungs/Pleura: No pneumothorax. Small layering right pleural effusion. Moderately large hyperdense residual/recurrent left pleural effusion probably hemorrhagic. compressive atelectasis throughout the basilar segments of left lower lobe and inferior lingula. Bilateral pulmonary  nodules with slight progression. Index right lower lobe nodule 19 mm image 99/5, previously 17 mm. Posterior 13 mm left upper lobe nodule image 50/5, previously 11 mm by my measurement. Additional smaller nodules bilaterally, several cavitary. Upper Abdomen: Previous right nephrectomy. Mixed attenuation left renal mass, incompletely visualized. Progressive inflammatory/edematous changes around pancreatic head and body, incompletely seen. Stable left adrenal enlargement. Musculoskeletal: Negative for fracture. IMPRESSION: 1. Moderately large  residual/recurrent hemorrhagic left effusion. 2. Progression of bilateral pulmonary nodules and mediastinal adenopathy. 3. Small pericardial and right pleural effusions. 4. New peripancreatic inflammatory/edematous changes suggesting pancreatitis. 5. Aortic and coronary atherosclerosis. 6. Left renal and left adrenal masses again noted. Electronically Signed   By: Lucrezia Europe M.D.   On: 08/16/2016 10:46   Dg Chest Port 1 View  Result Date: 08/16/2016 CLINICAL DATA:  Pleural effusion EXAM: PORTABLE CHEST 1 VIEW COMPARISON:  08/15/2016 FINDINGS: Moderate left pleural effusion again noted with left lower lobe atelectasis. Increasing left upper lobe airspace disease. Right basilar atelectasis or infiltrate. Mild cardiomegaly.  IMPRESSION: Continued moderate left pleural effusion with left lower lobe atelectasis or infiltrate and right basilar opacity. Increasing left upper lobe airspace opacity. Electronically Signed   By: Rolm Baptise M.D.   On: 08/16/2016 08:43   US Thoracentesis Asp Pleural Space W/img Guide  Result Date: 08/15/2016 INDICATION: SOB upon exertion. Cancer of the right kidney. Left pleural effusion. EXAM: ULTRASOUND GUIDED LEFT THORACENTESIS MEDICATIONS: 1% Lidocaine = 10 mL. COMPLICATIONS: None immediate. PROCEDURE: An ultrasound guided thoracentesis was thoroughly discussed with the patient and questions answered. The benefits, risks, alternatives and  complications were also discussed. The patient understands and wishes to proceed with the procedure. Written consent was obtained. Ultrasound was performed to localize and mark an adequate pocket of fluid in the left chest. The area was then prepped and draped in the normal sterile fashion. 1% Lidocaine was used for local anesthesia. Under ultrasound guidance a catheter was introduced. Thoracentesis was performed. The catheter was removed and a dressing applied. FINDINGS: A total of approximately 2 liters of bloody fluid was removed. Samples were sent to the laboratory as requested by the clinical team. IMPRESSION: Successful ultrasound guided left thoracentesis yielding 2 liters of pleural fluid. Post procedure radiograph shows no pneumothorax. Read by:  Gareth Eagle, PA-C Electronically Signed   By: Lucrezia Europe M.D.   On: 08/15/2016 13:52   I have reviewed the images personally  ASSESSMENT / PLAN: Recurrent left exudative/bloody appearing pleural effusion. D-dx: recurrent bloody malignant effusion vs iatrogenic hemothorax.  Hypodense and looks like could be some early loculation. Given description of effusion drained; rapidly progressive malignant effusion high on ddx, but also consider hemothorax given recent thora and having been on systemic ac.  Plan Stop systemic AC Await cytology as long as symptoms will allow.  Will likely need Pleur-x if this is malignant so if we can hold off on repeat thora this would be ideal   Hypovolemic vs hemorrhagic shock. Now resolved w/ IVFs and blood.  Plan Agree w/ SDU monitoring Cont IVFs  Acute on chronic anemia.  -she's +1 liter on fluid balance. Could be some degree of hemodilution but given she was on [facility] certainly a concern for acute blood loss. -transfused by primary team  Plan Hold systemic ac Repeat CBC Transfuse as indicated.  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 Imaging as of 3/21 showed stable disease. Now w/ mod/large  left effusion. Worrisome for malignant effusion Plan Holding Votrient   Recent DVT/PE Feb 2018.  Plan Repeat LE dopplers  Holding systemic AC given concern for hemothorax.  May need IVC filter   Acute on chronic renal failure (stage III CKD) Plan Avoid hypotension Hold antihypertensives and diuretics.   DM type II Plan Per primary team   Discussed with patient, family and Dr. Elon Jester, hospitalist.  More then 1/2 the time of the 40 min visit was spent in counseling and/or coordination of care with the patient and family.  Marshell Garfinkel MD [facility] Pager [phone] If no answer or after 3pm call: [phone] 08/16/2016, 5:53 PM

## 2016-08-16 NOTE — Progress Notes (Signed)
CRITICAL VALUE ALERT  Critical value received:  hgb  Date of notification:  08/16/16  Time of notification:  0802  Critical value read back:Yes.    Nurse who received alert:  Pam  MD notified (1st page):  Quincy Simmonds  Time of first page:  0809  MD notified (2nd page):  Time of second page:  Responding MD:   Time MD responded:

## 2016-08-17 ENCOUNTER — Inpatient Hospital Stay (HOSPITAL_COMMUNITY): Payer: Medicare Other

## 2016-08-17 ENCOUNTER — Encounter (HOSPITAL_COMMUNITY): Payer: Self-pay | Admitting: Anesthesiology

## 2016-08-17 DIAGNOSIS — R0602 Shortness of breath: Secondary | ICD-10-CM

## 2016-08-17 DIAGNOSIS — J90 Pleural effusion, not elsewhere classified: Secondary | ICD-10-CM

## 2016-08-17 DIAGNOSIS — R609 Edema, unspecified: Secondary | ICD-10-CM

## 2016-08-17 DIAGNOSIS — C642 Malignant neoplasm of left kidney, except renal pelvis: Secondary | ICD-10-CM

## 2016-08-17 DIAGNOSIS — R0902 Hypoxemia: Secondary | ICD-10-CM

## 2016-08-17 DIAGNOSIS — I313 Pericardial effusion (noninflammatory): Secondary | ICD-10-CM

## 2016-08-17 DIAGNOSIS — C7800 Secondary malignant neoplasm of unspecified lung: Secondary | ICD-10-CM

## 2016-08-17 DIAGNOSIS — R918 Other nonspecific abnormal finding of lung field: Secondary | ICD-10-CM

## 2016-08-17 DIAGNOSIS — C7971 Secondary malignant neoplasm of right adrenal gland: Secondary | ICD-10-CM

## 2016-08-17 DIAGNOSIS — J9811 Atelectasis: Secondary | ICD-10-CM

## 2016-08-17 DIAGNOSIS — L8990 Pressure ulcer of unspecified site, unspecified stage: Secondary | ICD-10-CM | POA: Insufficient documentation

## 2016-08-17 DIAGNOSIS — C7972 Secondary malignant neoplasm of left adrenal gland: Secondary | ICD-10-CM

## 2016-08-17 LAB — BASIC METABOLIC PANEL
Anion gap: 8 (ref 5–15)
BUN: 26 mg/dL — AB (ref 6–20)
CHLORIDE: 107 mmol/L (ref 101–111)
CO2: 19 mmol/L — AB (ref 22–32)
Calcium: 7.7 mg/dL — ABNORMAL LOW (ref 8.9–10.3)
Creatinine, Ser: 2.03 mg/dL — ABNORMAL HIGH (ref 0.44–1.00)
GFR calc Af Amer: 28 mL/min — ABNORMAL LOW (ref >60)
GFR calc non Af Amer: 24 mL/min — ABNORMAL LOW (ref >60)
Glucose, Bld: 214 mg/dL — ABNORMAL HIGH (ref 65–99)
Potassium: 4.1 mmol/L (ref 3.5–5.1)
SODIUM: 134 mmol/L — AB (ref 135–145)

## 2016-08-17 LAB — GLUCOSE, CAPILLARY
GLUCOSE-CAPILLARY: 141 mg/dL — AB (ref 65–99)
GLUCOSE-CAPILLARY: 178 mg/dL — AB (ref 65–99)
Glucose-Capillary: 97 mg/dL (ref 65–99)
Glucose-Capillary: 172 mg/dL — ABNORMAL HIGH (ref 65–99)

## 2016-08-17 LAB — CBC
HEMATOCRIT: 22.8 % — AB (ref 36.0–46.0)
Hemoglobin: 7.4 g/dL — ABNORMAL LOW (ref 12.0–15.0)
MCH: 30.3 pg (ref 26.0–34.0)
MCHC: 32.5 g/dL (ref 30.0–36.0)
MCV: 93.4 fL (ref 78.0–100.0)
Platelets: 389 10*3/uL (ref 150–400)
RBC: 2.44 MIL/uL — ABNORMAL LOW (ref 3.87–5.11)
RDW: 20.2 % — AB (ref 11.5–15.5)
WBC: 10.5 10*3/uL (ref 4.0–10.5)

## 2016-08-17 LAB — PREPARE RBC (CROSSMATCH)

## 2016-08-17 LAB — HEMOGLOBIN AND HEMATOCRIT, BLOOD
HCT: 28.5 % — ABNORMAL LOW (ref 36.0–46.0)
Hemoglobin: 8.9 g/dL — ABNORMAL LOW (ref 12.0–15.0)

## 2016-08-17 MED ORDER — SODIUM CHLORIDE 0.9 % IV SOLN
Freq: Once | INTRAVENOUS | Status: AC
  Administered 2016-08-17: 10:00:00 via INTRAVENOUS

## 2016-08-17 NOTE — Progress Notes (Signed)
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 Patient reported symptoms to her oncologist who recommended to stop her medications and report to the emergency department for an operation. In the ED she was found to be dehydrated with creatinine of 2.9 BUN of 34. Chest x-ray was done which showed moderate to large pleural effusion worsened from prior studies and a lactic acid level of 2.24. Patient admitted for IV hydration and further evaluation.  Subjective: Patient seen and examined, doing well this AM. No complaints. BP soft. Denies chest pain, SOB and palpitations. CXR looks worse today   Assessment & Plan: Acute kidney failure on chronic kidney disease stage III. In view of volume depletion and poor oral appetite. Cr continues to improve  Holding IVF  Baseline creatinine about 1.5, creatinine upon admission 2.90 Encourage oral hydration Holding HCTZ, losartan and Votrient  Repeat BMP in AM   Left pleural effusion, likely secondary to malignancy. Hemothorax ? Malignant vs Traumatic  Patient is status post ultrasound-guided thoracentesis 08/15/16 - Tap was bloody  Effusion worsening- PCCM recommendations appreciated  Fluid analysis pending - if malignant effusion patient would benefit form a PleurX cath  Repeat CXR in AM   Acute blood loss anemia ? 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   Moderate protein caloric malnutrition due to chronic illness PT consulted Dietitian recommendations appreciated  Renal cell carcinoma with lung metastasis  Dr Alen Blew informed of admission  See above   DVT prophylaxis: Heparin drip Code Status: Full code Family Communication: sister at bedside Disposition Plan: Transfer to SDU   Consultants:   Interventional radiology  PCCM   Procedures:   Ultrasound-guided thoracentesis 08/15/2016  Antimicrobials: Anti-infectives    None       Objective: Vitals:   08/17/16 0000 08/17/16 0100 08/17/16 0406 08/17/16 0800  BP: (!) 102/57 114/60  (!) 96/59  Pulse:    (!) 103  Resp: 14 16  15   Temp:   98.1 F (36.7 C)   TempSrc:   Oral   SpO2:    97%  Weight:      Height:        Intake/Output Summary (Last 24 hours) at 08/17/16 0834 Last data filed at 08/16/16 1540  Gross per 24 hour  Intake             1470 ml  Output              400 ml  Net             1070 ml   Filed Weights   08/14/16 1612 08/14/16 2334 08/16/16 2200  Weight: 88 kg (194 lb) 82.6 kg (182 lb  1.6 oz) 89 kg (196 lb 3.4 oz)    Examination:  General exam: NAD  Respiratory system: Breath sound decrease on the left, mild crackles on the right  Cardiovascular system: RRR 2/6 SM  Gastrointestinal system: Abd soft  Central nervous system: AAOx3  Extremities: No pedal edema  Skin: No rashes   Data Reviewed: I have personally reviewed following labs and imaging studies  CBC:  Recent Labs Lab 08/14/16 1635 08/15/16 0201 08/16/16 0507 08/16/16 1824 08/17/16 0041  WBC 9.7 7.7 11.8*  --  10.5  HGB 9.4* 8.7* 6.3* 7.2* 7.4*  HCT 31.2* 28.9* 21.8* 22.0* 22.8*  MCV 96.9 98.0 98.2  --  93.4  PLT 528* 465* 594*  --  938   Basic Metabolic  Panel:  Recent Labs Lab 08/14/16 1635 08/15/16 0201 08/16/16 0507 08/17/16 0041  NA 139 140 137 134*  K 3.6 3.7 4.6 4.1  CL 101 106 107 107  CO2 24 24 20* 19*  GLUCOSE 186* 130* 108* 214*  BUN 34* 30* 27* 26*  CREATININE 2.90* 2.46* 2.32* 2.03*  CALCIUM 8.8* 7.9* 7.9* 7.7*   GFR: Estimated Creatinine Clearance: 31.4 mL/min (A) (by C-G formula based on SCr of 2.03 mg/dL (H)). Liver Function Tests:  Recent Labs Lab 08/14/16 1635  AST 18  ALT 9*  ALKPHOS 92  BILITOT 0.7  PROT 7.4  ALBUMIN 2.7*    Recent Labs Lab 08/14/16 1635  LIPASE 24   No results for input(s): AMMONIA in the last 168 hours. Coagulation Profile:  Recent Labs Lab 08/14/16 1635 08/14/16 2336 08/15/16 0201  INR 1.98 1.96 1.83   Cardiac Enzymes: No results for input(s): CKTOTAL, CKMB, CKMBINDEX, TROPONINI in the last 168 hours. BNP (last 3 results) No results for input(s): PROBNP in the last 8760 hours. HbA1C: No results for input(s): HGBA1C in the last 72 hours. CBG:  Recent Labs Lab 08/16/16 0742 08/16/16 1134 08/16/16 1625 08/16/16 2117 08/17/16 0739  GLUCAP 99 107* 160* 166* 97   Lipid Profile: No results for input(s): CHOL, HDL, LDLCALC, TRIG, CHOLHDL, LDLDIRECT in the last 72 hours. Thyroid Function Tests:  Recent Labs  08/14/16 1635  TSH 4.082   Anemia Panel: No results for input(s): VITAMINB12, FOLATE, FERRITIN, TIBC, IRON, RETICCTPCT in the last 72 hours. Sepsis Labs:  Recent Labs Lab 08/14/16 1748 08/14/16 2254 08/14/16 2255 08/15/16 0201  PROCALCITON  --  1.18  --   --   LATICACIDVEN 2.24*  --  2.1* 1.2    Recent Results (from the past 240 hour(s))  Urine culture     Status: None   Collection Time: 08/14/16 10:55 PM  Result Value Ref Range Status   Specimen Description URINE, RANDOM  Final   Special Requests NONE  Final   Culture   Final    NO GROWTH Performed at [facility], [place] [address] [place], [place] [address]    Report Status  08/16/2016 FINAL  Final  Culture, body fluid-bottle     Status: None (Preliminary result)   Collection Time: 08/15/16  1:40 PM  Result Value Ref Range Status   Specimen Description PLEURAL LEFT  Final   Special Requests BOTTLES DRAWN AEROBIC AND ANAEROBIC  Final   Culture   Final    NO GROWTH < 24 HOURS Performed at [facility], [place] [address] [place], [place] [address]    Report Status PENDING  Incomplete  Gram stain     Status: None   Collection Time: 08/15/16  1:40 PM  Result Value Ref Range Status   Specimen Description PLEURAL LEFT  Final   Special Requests NONE  Final   Gram Stain   Final    FEW WBC PRESENT,BOTH PMN AND MONONUCLEAR NO ORGANISMS SEEN Performed at [facility], [address] [place], [place] [address]    Report Status 08/16/2016 FINAL  Final  MRSA PCR Screening     Status: None   Collection Time: 08/16/16 10:33 PM  Result Value Ref Range Status   MRSA by PCR NEGATIVE NEGATIVE Final    Comment:        The GeneXpert MRSA Assay (FDA approved for NASAL specimens only), is one component of a comprehensive MRSA colonization surveillance program. It is not intended to diagnose MRSA infection nor to guide or monitor treatment for MRSA infections.          Radiology Studies: Dg Chest 1 View  Result Date: 08/15/2016 CLINICAL DATA:  Status post left thoracentesis. EXAM: CHEST 1 VIEW COMPARISON:  Aug 14, 2016 FINDINGS: The left-sided pleural effusion remains but is smaller. No pneumothorax. No other changes. IMPRESSION: Decreased size of left-sided pleural effusion after thoracentesis. No pneumothorax. Electronically Signed   By: Dorise Bullion III M.D   On: 08/15/2016 14:09   Ct Chest Wo Contrast  Result Date: 08/16/2016 CLINICAL DATA:  Drop in hemoglobin post thoracentesis. EXAM: CT CHEST WITHOUT CONTRAST TECHNIQUE: Multidetector CT imaging of the chest was performed following the standard protocol without IV contrast. COMPARISON:   06/17/2016 FINDINGS: Cardiovascular: Extensive aortic and coronary atheromatous calcifications. No aneurysm. Small pericardial effusion. Mediastinum/Nodes: 18 mm enlarging left suprahilar node, previously 14 mm. 1 cm pretracheal node, previously 7 mm. No axillary adenopathy. Lungs/Pleura: No pneumothorax. Small layering right pleural effusion. Moderately large hyperdense residual/recurrent left pleural effusion probably hemorrhagic. compressive atelectasis throughout the basilar segments of left lower lobe and inferior lingula. Bilateral pulmonary nodules with slight progression. Index right lower lobe nodule 19 mm image 99/5, previously 17 mm. Posterior 13 mm left upper lobe nodule image 50/5, previously 11 mm by my measurement. Additional smaller nodules bilaterally, several cavitary. Upper Abdomen: Previous right nephrectomy. Mixed attenuation left renal mass, incompletely visualized. Progressive inflammatory/edematous changes around pancreatic head and body, incompletely seen. Stable left adrenal enlargement. Musculoskeletal: Negative for fracture. IMPRESSION: 1. Moderately large  residual/recurrent hemorrhagic left effusion. 2. Progression of bilateral pulmonary nodules and mediastinal adenopathy. 3. Small pericardial and right pleural effusions. 4. New peripancreatic inflammatory/edematous changes suggesting pancreatitis. 5. Aortic and coronary atherosclerosis. 6. Left renal and left adrenal masses again noted. Electronically Signed   By: Lucrezia Europe M.D.   On: 08/16/2016 10:46   Dg Chest Port 1 View  Result Date: 08/17/2016 CLINICAL DATA:  66 year old female left-sided pleural effusion. Subsequent encounter. EXAM: PORTABLE CHEST 1 VIEW COMPARISON:  08/16/2016 CT and chest x-ray. FINDINGS: Large left-sided and small right-sided pleural effusion. Almost complete opacification left hemithorax. Left-sided pleural effusion has increased in size since prior exam. CT detected pulmonary nodules and adenopathy not  as well delineated on present plain film exam. IMPRESSION: Increase in size of large left-sided pleural effusion. Almost complete opacification left hemithorax. Small right-sided pleural effusion. Electronically Signed   By: Genia Del M.D.   On: 08/17/2016 07:21   Dg Chest Port 1 View  Result Date: 08/16/2016 CLINICAL DATA:  Pleural effusion EXAM: PORTABLE CHEST 1 VIEW COMPARISON:  08/15/2016 FINDINGS: Moderate left pleural effusion again noted with left lower lobe atelectasis. Increasing left upper lobe airspace disease. Right  basilar atelectasis or infiltrate. Mild cardiomegaly. IMPRESSION: Continued moderate left pleural effusion with left lower lobe atelectasis or infiltrate and right basilar opacity. Increasing left upper lobe airspace opacity. Electronically Signed   By: Rolm Baptise M.D.   On: 08/16/2016 08:43   US Thoracentesis Asp Pleural Space W/img Guide  Result Date: 08/15/2016 INDICATION: SOB upon exertion. Cancer of the right kidney. Left pleural effusion. EXAM: ULTRASOUND GUIDED LEFT THORACENTESIS MEDICATIONS: 1% Lidocaine = 10 mL. COMPLICATIONS: None immediate. PROCEDURE: An ultrasound guided thoracentesis was thoroughly discussed with the patient and questions answered. The benefits, risks, alternatives and complications were also discussed. The patient understands and wishes to proceed with the procedure. Written consent was obtained. Ultrasound was performed to localize and mark an adequate pocket of fluid in the left chest. The area was then prepped and draped in the normal sterile fashion. 1% Lidocaine was used for local anesthesia. Under ultrasound guidance a catheter was introduced. Thoracentesis was performed. The catheter was removed and a dressing applied. FINDINGS: A total of approximately 2 liters of bloody fluid was removed. Samples were sent to the laboratory as requested by the clinical team. IMPRESSION: Successful ultrasound guided left thoracentesis yielding 2 liters of  pleural fluid. Post procedure radiograph shows no pneumothorax. Read by:  Gareth Eagle, PA-C Electronically Signed   By: Lucrezia Europe M.D.   On: 08/15/2016 13:52    Scheduled Meds: . feeding supplement  1 Container Oral TID BM  . insulin aspart  0-9 Units Subcutaneous TID WC  . insulin glargine  15 Units Subcutaneous Daily  . mouth rinse  15 mL Mouth Rinse BID  . mirtazapine  15 mg Oral QHS  . pantoprazole  40 mg Oral QODAY  . rosuvastatin  10 mg Oral QHS  . sodium chloride flush  3 mL Intravenous Q12H   Continuous Infusions: . sodium chloride       LOS: 3 days    Chipper Oman, MD Pager: Text Page via [email or web address]  [phone]  If 7PM-7AM, please contact night-coverage [email or web address] Password [email or web address] 08/17/2016, 8:34 AM

## 2016-08-17 NOTE — Care Management Note (Signed)
Case Management Note  Patient Details  Name: Laura Neal MRN: [id number] Date of Birth: Jan 18, 1951  Subjective/Objective:        anemia          66 y.o. 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  She was advised to hold her Votrient and report to the ED for evaluation. She reports that her GI symptoms are intermittent and not necessarily occurring every day.  She is having one loose stool daily, but it is normal in color.  No BRBPR.  No melena.  Her emesis is clear.  No hematemesis.  She is having intermittent periumbilical and LLQ pain.  No fever.  She has had decreased PO intake for at least several weeks now and reports that appetite stimulants have not been well tolerated in the past.  She is losing weight.  Action/Plan:  Date:  Aug 17, 2016  Chart reviewed for concurrent status and case management needs.  Will continue to follow patient progress.  Discharge Planning: following for needs  Expected discharge date: [id number]  Velva Harman, BSN, [place], [place]   Expected Discharge Date:                  Expected Discharge Plan:  Home/Self Care  In-House Referral:     Discharge planning Services  CM Consult  Post Acute Care Choice:    Choice offered to:     DME Arranged:    DME Agency:     HH Arranged:    HH Agency:     Status of Service:  In process, will continue to follow  If discussed at Long Length of Stay Meetings, dates  discussed:    Additional Comments:  Leeroy Cha, RN 08/17/2016, 9:25 AM

## 2016-08-17 NOTE — Progress Notes (Signed)
*  PRELIMINARY RESULTS* Vascular Ultrasound Bilateral lower extremity venous duplex has been completed.  Preliminary findings: No evidence of deep vein thrombosis or baker's cysts bilaterally. Previous deep vein thrombosis from 05/26/16 appears to be resolved, no evidence of thrombosis on today's exam.   Everrett Coombe 08/17/2016, 2:58 PM

## 2016-08-17 NOTE — Progress Notes (Signed)
IP PROGRESS NOTE  Subjective:   Events noted in the last 48 hours. Patient hospitalized with failure to thrive and renal insufficiency. She was found to have recurrent pleural effusion and that was drained on 08/15/2016 with 2 L of bloody fluid noted at the time. Fluid analysis has been pending.  Clinically she reports feeling slightly improved although still generally weak. She is able to eat and keep food down for the time being.  Objective:  Vital signs in last 24 hours: Temp:  [97.4 F (36.3 C)-98.3 F (36.8 C)] 98.3 F (36.8 C) (05/21 1020) Pulse Rate:  [84-106] 103 (05/21 0800) Resp:  [14-19] 19 (05/21 1020) BP: (96-115)/(44-61) 111/44 (05/21 1020) SpO2:  [93 %-97 %] 96 % (05/21 1020) Weight:  [196 lb 3.4 oz (89 kg)] 196 lb 3.4 oz (89 kg) (05/20 2200) Weight change:  Last BM Date: 08/16/16  Intake/Output from previous day: 05/20 0701 - 05/21 0700 In: 7948 [I.V.:750; Blood:310] Out: 400 [Urine:400] Chronically ill-appearing woman appeared without distress. Mouth: mucous membranes moist, pharynx normal without lesions Resp: clear to auscultation bilaterally decreased breath sounds at the bases. Cardio: regular rate and rhythm, S1, S2 normal, no murmur, click, rub or gallop GI: soft, non-tender; bowel sounds normal; no masses,  no organomegaly Extremities: extremities normal, atraumatic, no cyanosis or edema  Portacath/PICC-without erythema  Lab Results:  Recent Labs  08/16/16 0507 08/16/16 1824 08/17/16 0041  WBC 11.8*  --  10.5  HGB 6.3* 7.2* 7.4*  HCT 21.8* 22.0* 22.8*  PLT 594*  --  389    BMET  Recent Labs  08/16/16 0507 08/17/16 0041  NA 137 134*  K 4.6 4.1  CL 107 107  CO2 20* 19*  GLUCOSE 108* 214*  BUN 27* 26*  CREATININE 2.32* 2.03*  CALCIUM 7.9* 7.7*    Ct Chest Wo Contrast  Result Date: 08/16/2016 CLINICAL DATA:  Drop in hemoglobin post thoracentesis. EXAM: CT CHEST WITHOUT CONTRAST TECHNIQUE: Multidetector CT imaging of the chest was  performed following the standard protocol without IV contrast. COMPARISON:  06/17/2016 FINDINGS: Cardiovascular: Extensive aortic and coronary atheromatous calcifications. No aneurysm. Small pericardial effusion. Mediastinum/Nodes: 18 mm enlarging left suprahilar node, previously 14 mm. 1 cm pretracheal node, previously 7 mm. No axillary adenopathy. Lungs/Pleura: No pneumothorax. Small layering right pleural effusion. Moderately large hyperdense residual/recurrent left pleural effusion probably hemorrhagic. compressive atelectasis throughout the basilar segments of left lower lobe and inferior lingula. Bilateral pulmonary nodules with slight progression. Index right lower lobe nodule 19 mm image 99/5, previously 17 mm. Posterior 13 mm left upper lobe nodule image 50/5, previously 11 mm by my measurement. Additional smaller nodules bilaterally, several cavitary. Upper Abdomen: Previous right nephrectomy. Mixed attenuation left renal mass, incompletely visualized. Progressive inflammatory/edematous changes around pancreatic head and body, incompletely seen. Stable left adrenal enlargement. Musculoskeletal: Negative for fracture. IMPRESSION: 1. Moderately large  residual/recurrent hemorrhagic left effusion. 2. Progression of bilateral pulmonary nodules and mediastinal adenopathy. 3. Small pericardial and right pleural effusions. 4. New peripancreatic inflammatory/edematous changes suggesting pancreatitis. 5. Aortic and coronary atherosclerosis. 6. Left renal and left adrenal masses again noted. Electronically Signed   By: Lucrezia Europe M.D.   On: 08/16/2016 10:46    Medications: I have reviewed the patient's current medications.  Assessment/Plan:  66 year old woman with the following issues:  1. Renal cell carcinoma with metastatic disease to the lung. She has been on Votrient but appears to have progressed at this time based on recent imaging studies. I have instructed her to discontinue  Votrient for the time  being and difference of his therapy will be used once her acute illness have subsided. She still has a reasonable performance status when she is healthy that further treatment is warranted at least for the time being.  2. Recurrent pleural effusion: Cytology is pending. I agree with a Pleurx catheter if indeed we're dealing with malignant effusion which is likely the case.  3. VTE: Full dose anticoagulation is on hold because of possible bleeding.  4. Acute renal failure: Appears to be related to dehydration and is improving.  5. Prognosis: She has an incurable malignancy that could potentially be palliated further in the future.  We will continue to follow her progress while she is hospitalized.   LOS: 3 days   OLMBEM,LJQGB 08/17/2016, 10:41 AM

## 2016-08-17 NOTE — Progress Notes (Signed)
Received call from Dr. Vaughan Browner to send patient to [facility]. All VS stable, patient A&O x 4. Bedside report given to [facility] in ICU.

## 2016-08-17 NOTE — Progress Notes (Signed)
PT Cancellation Note  Patient Details Name: Laura Neal MRN: [id number] DOB: 1950/08/25   Cancelled Treatment:    Reason Eval/Treat Not Completed: Other (comment) (RN about to start PRBCs, requests PT to check back)   Deysy Schabel,KATHrine E 08/17/2016, 10:35 AM Carmelia Bake, PT, DPT 08/17/2016 Pager: [phone]

## 2016-08-17 NOTE — Evaluation (Signed)
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 In ER found to be dehydrated, renal fxn had decline and found to have large to mod sized left effusion. S/P left thoracentesis on 5/19.   Clinical Impression  Pt admitted with above diagnosis. Pt currently with functional limitations due to the deficits listed below (see PT Problem List).  Pt will benefit from skilled PT to increase their independence and safety with mobility to allow discharge to the venue listed below.  Pt with recurrent pleural effusion and cytology pending however mobilizing fairly well.  Pt reports she has 6 sisters that will be able to assist her at home upon d/c.  Possible palliative approach pending further work up.  Recommend HHPT if pt agreeable.     Follow Up Recommendations Home health PT;Supervision for mobility/OOB    Equipment Recommendations  None recommended by PT    Recommendations for Other Services       Precautions / Restrictions Precautions Precautions: Fall Precaution Comments: monitor vitals      Mobility  Bed Mobility Overal bed mobility: Needs Assistance Bed Mobility: Supine to Sit;Sit to Supine     Supine to sit: Supervision Sit to supine: Min assist   General bed mobility comments: assist for LEs onto bed due to fatigue  Transfers Overall transfer level: Needs assistance Equipment used: Rolling walker (2 wheeled) Transfers: Sit to/from Stand Sit to Stand: Min guard         General transfer comment: min/guard for safety  Ambulation/Gait Ambulation/Gait assistance: Min guard Ambulation Distance (Feet): 80 Feet Assistive device: Rolling walker (2 wheeled) Gait  Pattern/deviations: Step-through pattern;Decreased stance time - right;Antalgic     General Gait Details: slightly antalgic gait, elevated RR to 33, HR 122 bpm, distance to tolerance  Stairs            Wheelchair Mobility    Modified Rankin (Stroke Patients Only)       Balance Overall balance assessment: Needs assistance         Standing balance support: Bilateral upper extremity supported Standing balance-Leahy Scale: Poor Standing balance comment: uses RW for mobility                             Pertinent Vitals/Pain Pain Assessment: 0-10 Pain Score: 5  Pain Location: L flank Pain Descriptors / Indicators: Grimacing;Sore Pain Intervention(s): Limited activity within patient's tolerance;Monitored during session;Patient requesting pain meds-RN notified;Repositioned    Home Living Family/patient expects to be discharged to:: Private residence   Available Help at Discharge: Family;Available 24 hours/day (6 sisters can assist upon d/c per pt) Type of Home: House Home Access: Stairs to enter Entrance Stairs-Rails: Right Entrance Stairs-Number of Steps: 3 Home Layout: Two level;Bed/bath upstairs;Able to live on main level with bedroom/bathroom Home Equipment: Gilford Rile - 2 wheels;Cane - single point;Bedside commode      Prior Function Level of Independence: Independent with assistive device(s)         Comments: RW for mobility at home     Hand Dominance        Extremity/Trunk Assessment        Lower  Extremity Assessment Lower Extremity Assessment: Generalized weakness       Communication   Communication: No difficulties  Cognition Arousal/Alertness: Awake/alert Behavior During Therapy: WFL for tasks assessed/performed Overall Cognitive Status: Within Functional Limits for tasks assessed                                        General Comments      Exercises     Assessment/Plan    PT Assessment Patient needs  continued PT services  PT Problem List Decreased strength;Decreased mobility;Decreased activity tolerance;Decreased knowledge of use of DME       PT Treatment Interventions DME instruction;Gait training;Therapeutic exercise;Therapeutic activities;Functional mobility training;Stair training;Patient/family education    PT Goals (Current goals can be found in the Care Plan section)  Acute Rehab PT Goals PT Goal Formulation: With patient Time For Goal Achievement: 08/24/16 Potential to Achieve Goals: Good    Frequency Min 3X/week   Barriers to discharge        Co-evaluation               AM-PAC PT "6 Clicks" Daily Activity  Outcome Measure Difficulty turning over in bed (including adjusting bedclothes, sheets and blankets)?: None Difficulty moving from lying on back to sitting on the side of the bed? : A Little Difficulty sitting down on and standing up from a chair with arms (e.g., wheelchair, bedside commode, etc,.)?: A Little Help needed moving to and from a bed to chair (including a wheelchair)?: A Little Help needed walking in hospital room?: A Little Help needed climbing 3-5 steps with a railing? : A Little 6 Click Score: 19    End of Session Equipment Utilized During Treatment: Gait belt Activity Tolerance: Patient limited by fatigue Patient left: in bed;with call bell/phone within reach;with bed alarm set Nurse Communication: Mobility status;Patient requests pain meds PT Visit Diagnosis: Other abnormalities of gait and mobility (R26.89)    Time: 1520-1535 PT Time Calculation (min) (ACUTE ONLY): 15 min   Charges:   PT Evaluation $PT Eval Low Complexity: 1 Procedure     PT G Codes:        Carmelia Bake, PT, DPT 08/17/2016 Pager: [phone]   York Ram E 08/17/2016, 4:03 PM

## 2016-08-17 NOTE — Progress Notes (Signed)
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 Admitted 5/18 w/ cc: ~ 1 week h/o N/V and abd discomfort. In ER found to be dehydrated, renal fxn had decline and found to have large to mod sized left effusion. Her NOAC was held. Placed on heparin gtt and then she underwent left thoracentesis on 5/19. 2 liters of bloody fluid was evacuated fluid thus far exudate & cytology remains pending. On 5/20 am hgb had drifted from 8.7 to 6.3 BP as low as 80s. Imaging by CXR and CT suggesting either re-occurrence vs reaccumulation of left effusion. PCCM asked to evaluate    SUBJECTIVE:  RN reports pt to receive one unit blood per primary MD.  Pt denies significant SOB.   VITAL SIGNS: Temp:  [97.4 F (36.3 C)-98.2 F (36.8 C)] 98.1 F (36.7 C) (05/21 0406) Pulse Rate:  [84-106] 103 (05/21 0800) Resp:  [14-18] 15 (05/21 0800) BP: (96-115)/(49-61) 96/59 (05/21 0800) SpO2:  [93 %-97 %] 97 % (05/21 0800) Weight:  [196 lb 3.4 oz (89 kg)] 196 lb 3.4 oz (89 kg) (05/20 2200)  PHYSICAL EXAMINATION: General: chronically ill appearing female in NAD  HEENT: MM pink/moist, no jvd PSY: calm/appropriate Neuro: AAOx4, speech clear, MAE CV: s1s2 rrr, no m/r/g PULM: even/non-labored, diminished on L, clear on R anterior / diminished lower YI:RSWN, non-tender, bsx4 active  Extremities: warm/dry, no edema  Skin: no rashes or lesions    Recent Labs Lab 08/15/16 0201 08/16/16 0507 08/17/16 0041  NA 140 137 134*  K 3.7 4.6 4.1  CL 106 107 107  CO2 24 20* 19*  BUN 30* 27* 26*  CREATININE 2.46* 2.32* 2.03*  GLUCOSE 130* 108* 214*    Recent Labs Lab 08/15/16 0201  08/16/16 0507 08/16/16 1824 08/17/16 0041  HGB 8.7* 6.3* 7.2* 7.4*  HCT 28.9* 21.8* 22.0* 22.8*  WBC 7.7 11.8*  --  10.5  PLT 465* 594*  --  389   Dg Chest 1 View  Result Date: 08/15/2016 CLINICAL DATA:  Status post left thoracentesis. EXAM: CHEST 1 VIEW COMPARISON:  Aug 14, 2016 FINDINGS: The left-sided pleural effusion remains but is smaller. No pneumothorax. No other changes. IMPRESSION: Decreased size of left-sided pleural effusion after thoracentesis. No pneumothorax. Electronically Signed   By: Dorise Bullion III M.D   On: 08/15/2016 14:09   Ct Chest Wo Contrast  Result Date: 08/16/2016 CLINICAL DATA:  Drop in hemoglobin post thoracentesis. EXAM: CT CHEST WITHOUT CONTRAST TECHNIQUE: Multidetector CT imaging of the chest was performed following the standard protocol without IV contrast. COMPARISON:  06/17/2016 FINDINGS: Cardiovascular: Extensive aortic and coronary atheromatous calcifications. No aneurysm. Small pericardial effusion. Mediastinum/Nodes: 18 mm enlarging left suprahilar node, previously 14 mm. 1 cm pretracheal node, previously 7 mm. No axillary adenopathy. Lungs/Pleura: No pneumothorax. Small layering right pleural effusion. Moderately large hyperdense residual/recurrent left pleural effusion probably hemorrhagic. compressive atelectasis throughout the basilar segments of left lower lobe and inferior lingula. Bilateral pulmonary nodules with slight progression. Index right lower lobe nodule 19 mm image 99/5, previously 17 mm. Posterior 13 mm left upper lobe nodule image 50/5, previously 11  mm by my measurement. Additional smaller nodules bilaterally, several cavitary. Upper Abdomen: Previous right nephrectomy. Mixed attenuation left renal mass, incompletely visualized. Progressive inflammatory/edematous changes around pancreatic head and body, incompletely seen. Stable left adrenal enlargement. Musculoskeletal: Negative for fracture. IMPRESSION: 1. Moderately large   residual/recurrent hemorrhagic left effusion. 2. Progression of bilateral pulmonary nodules and mediastinal adenopathy. 3. Small pericardial and right pleural effusions. 4. New peripancreatic inflammatory/edematous changes suggesting pancreatitis. 5. Aortic and coronary atherosclerosis. 6. Left renal and left adrenal masses again noted. Electronically Signed   By: Lucrezia Europe M.D.   On: 08/16/2016 10:46   Dg Chest Port 1 View  Result Date: 08/17/2016 CLINICAL DATA:  66 year old female left-sided pleural effusion. Subsequent encounter. EXAM: PORTABLE CHEST 1 VIEW COMPARISON:  08/16/2016 CT and chest x-ray. FINDINGS: Large left-sided and small right-sided pleural effusion. Almost complete opacification left hemithorax. Left-sided pleural effusion has increased in size since prior exam. CT detected pulmonary nodules and adenopathy not as well delineated on present plain film exam. IMPRESSION: Increase in size of large left-sided pleural effusion. Almost complete opacification left hemithorax. Small right-sided pleural effusion. Electronically Signed   By: Genia Del M.D.   On: 08/17/2016 07:21   Dg Chest Port 1 View  Result Date: 08/16/2016 CLINICAL DATA:  Pleural effusion EXAM: PORTABLE CHEST 1 VIEW COMPARISON:  08/15/2016 FINDINGS: Moderate left pleural effusion again noted with left lower lobe atelectasis. Increasing left upper lobe airspace disease. Right basilar atelectasis or infiltrate. Mild cardiomegaly. IMPRESSION: Continued moderate left pleural effusion with left lower lobe atelectasis or infiltrate and right basilar opacity. Increasing left upper lobe airspace opacity. Electronically Signed   By: Rolm Baptise M.D.   On: 08/16/2016 08:43   US Thoracentesis Asp Pleural Space W/img Guide  Result Date: 08/15/2016 INDICATION: SOB upon exertion. Cancer of the right kidney. Left pleural effusion. EXAM: ULTRASOUND GUIDED LEFT THORACENTESIS MEDICATIONS: 1% Lidocaine = 10 mL. COMPLICATIONS: None  immediate. PROCEDURE: An ultrasound guided thoracentesis was thoroughly discussed with the patient and questions answered. The benefits, risks, alternatives and complications were also discussed. The patient understands and wishes to proceed with the procedure. Written consent was obtained. Ultrasound was performed to localize and mark an adequate pocket of fluid in the left chest. The area was then prepped and draped in the normal sterile fashion. 1% Lidocaine was used for local anesthesia. Under ultrasound guidance a catheter was introduced. Thoracentesis was performed. The catheter was removed and a dressing applied. FINDINGS: A total of approximately 2 liters of bloody fluid was removed. Samples were sent to the laboratory as requested by the clinical team. IMPRESSION: Successful ultrasound guided left thoracentesis yielding 2 liters of pleural fluid. Post procedure radiograph shows no pneumothorax. Read by:  Gareth Eagle, PA-C Electronically Signed   By: Lucrezia Europe M.D.   On: 08/15/2016 13:52    SIGNIFICANT EVENTS  5/18  Admitted 5/19  Left thoracentesis 2 liters. Exudate. Red/turbid.  5/21  PRBC per primary   STUDIES:  CT chest 5/20 >> Moderately large  residual/recurrent hemorrhagic left effusion. Progression of bilateral pulmonary nodules and mediastinal adenopathy. Small pericardial and right pleural effusions. New peripancreatic inflammatory/edematous changes suggesting pancreatitis.  Aortic and coronary atherosclerosis.  Left renal and left adrenal masses again noted. LE Duplex 5/21 >>   ASSESSMENT / PLAN:  Recurrent Left Exudative Pleural Effusion - bloody appearing fluid, DDx malignant effusion vs iatrogenic hemothorax.  Hypodense and looks like could be some early loculation. Given description of effusion drained; rapidly progressive malignant effusion high on  ddx, but also consider hemothorax given recent thora and having been on systemic anticoagulation.  P: Hold systemic  anticoagulation  Await cytology  Will need Pleur-X if cytology returns as malignant Follow effusion size / pt symptoms   Hypovolemic vs hemorrhagic shock. Now resolved w/ IVFs and blood.  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 Now w/ mod/large left effusion. Worrisome for malignant effusion P: Hold Votrient    Recent DVT/PE Feb 2018.  P: Await LE doppler  Hold anticoagulation  May need IVC filter pending LE duplex   Acute on chronic renal failure (stage III CKD) P: Avoid hypotension  Trend BMP / urinary output Replace electrolytes as indicated Avoid nephrotoxic agents, ensure adequate renal perfusion  DM type II P: Per primary SVC    Family - patient updated on plan of care.  Discussed care with Dr. Quincy Simmonds.   Noe Gens, NP-C [facility] Pgr: [phone] or if no answer [phone] 08/17/2016, 9:46 AM  Attending Note:  66 year old female with renal cell carcinoma with mets to the lungs presenting with a pleural effusion and hemothorax s/p thora.  Cytology still pending.  On exam, lungs are clear on right and diminished on left.  I reviewed CXR myself, pleural effusion on the left noted.  Discussed with PCCM-NP.  Pleural effusion:  - F/U on cytology  - If malignant then will need a pleurex catheter  - No further thora or CT for now.  Hypoxemia:  - Titrate O2 for sat of 88-92%  - Will need an ambulatory desat study prior to discharge for ?home O2.  Lung masses: mets  - Oncology to address  Atelectasis:  - IS per RT protocol  - Mobilize as able.  PCCM will follow.  Patient seen and examined, agree with above note.   I dictated the care and orders written for this patient under my direction.  Rush Farmer, [place]

## 2016-08-18 ENCOUNTER — Inpatient Hospital Stay (HOSPITAL_COMMUNITY): Payer: Medicare Other

## 2016-08-18 DIAGNOSIS — R0902 Hypoxemia: Secondary | ICD-10-CM

## 2016-08-18 DIAGNOSIS — J90 Pleural effusion, not elsewhere classified: Secondary | ICD-10-CM

## 2016-08-18 DIAGNOSIS — J9811 Atelectasis: Secondary | ICD-10-CM

## 2016-08-18 LAB — TYPE AND SCREEN
ABO/RH(D): O POS
ANTIBODY SCREEN: NEGATIVE
UNIT DIVISION: 0
Unit division: 0

## 2016-08-18 LAB — CBC
HEMATOCRIT: 30.3 % — AB (ref 36.0–46.0)
Hemoglobin: 9.6 g/dL — ABNORMAL LOW (ref 12.0–15.0)
MCH: 28.9 pg (ref 26.0–34.0)
MCHC: 31.7 g/dL (ref 30.0–36.0)
MCV: 91.3 fL (ref 78.0–100.0)
PLATELETS: 411 10*3/uL — AB (ref 150–400)
RBC: 3.32 MIL/uL — ABNORMAL LOW (ref 3.87–5.11)
RDW: 19.2 % — AB (ref 11.5–15.5)
WBC: 12.6 10*3/uL — AB (ref 4.0–10.5)

## 2016-08-18 LAB — BPAM RBC
BLOOD PRODUCT EXPIRATION DATE: 201806072359
Blood Product Expiration Date: 201806072359
ISSUE DATE / TIME: 201805211032
ISSUE DATE / TIME: 201805201127
UNIT TYPE AND RH: 5100
Unit Type and Rh: 5100

## 2016-08-18 LAB — GLUCOSE, CAPILLARY
GLUCOSE-CAPILLARY: 110 mg/dL — AB (ref 65–99)
GLUCOSE-CAPILLARY: 113 mg/dL — AB (ref 65–99)
Glucose-Capillary: 261 mg/dL — ABNORMAL HIGH (ref 65–99)
Glucose-Capillary: 99 mg/dL (ref 65–99)

## 2016-08-18 LAB — BASIC METABOLIC PANEL
ANION GAP: 7 (ref 5–15)
BUN: 20 mg/dL (ref 6–20)
CALCIUM: 7.8 mg/dL — AB (ref 8.9–10.3)
CO2: 19 mmol/L — ABNORMAL LOW (ref 22–32)
Chloride: 108 mmol/L (ref 101–111)
Creatinine, Ser: 1.57 mg/dL — ABNORMAL HIGH (ref 0.44–1.00)
GFR, EST AFRICAN AMERICAN: 38 mL/min — AB (ref >60)
GFR, EST NON AFRICAN AMERICAN: 33 mL/min — AB (ref >60)
Glucose, Bld: 111 mg/dL — ABNORMAL HIGH (ref 65–99)
Potassium: 4 mmol/L (ref 3.5–5.1)
SODIUM: 134 mmol/L — AB (ref 135–145)

## 2016-08-18 MED ORDER — SODIUM CHLORIDE 0.9 % IV BOLUS (SEPSIS)
500.0000 mL | Freq: Once | INTRAVENOUS | Status: AC
  Administered 2016-08-18: 500 mL via INTRAVENOUS

## 2016-08-18 MED ORDER — SODIUM CHLORIDE 0.9 % IV SOLN
INTRAVENOUS | Status: DC
  Administered 2016-08-18 – 2016-08-20 (×3): via INTRAVENOUS

## 2016-08-18 NOTE — Progress Notes (Signed)
Name: Laura Neal MRN: [id number] DOB: 11-03-1950    ADMISSION DATE:  08/14/2016 CONSULTATION DATE:  5/20  REFERRING MD :  Quincy Simmonds   CHIEF COMPLAINT: recurrent pleural effusion with acute blood loss   BRIEF SUMMARY:   66 year old female w/ h/o RCC (on pazopanib) w/ known mets to left lung and adrenals (had stable disease as of March 2018), prior DVT was on NOAC. Admitted 5/18 w/ cc: ~ 1 week h/o N/V and abd discomfort. In ER found to be dehydrated, renal fxn had decline and found to have large to mod sized left effusion. Her NOAC was held. Placed on heparin gtt and then she underwent left thoracentesis on 5/19. 2 liters of bloody fluid was evacuated fluid thus far exudate & cytology remains pending. On 5/20 am hgb had drifted from 8.7 to 6.3 BP as low as 80s. Imaging by CXR and CT suggesting either re-occurrence vs reaccumulation of left effusion. PCCM asked to evaluate    SUBJECTIVE:   Pt reports dyspnea only with exertion.  At rest she reports she is comfortable.  Denies fever, chills, sputum production.   VITAL SIGNS: Temp:  [97.8 F (36.6 C)-98.3 F (36.8 C)] 98 F (36.7 C) (05/22 0403) Pulse Rate:  [96-109] 109 (05/22 0600) Resp:  [17-25] 18 (05/22 0600) BP: (94-118)/(44-68) 103/56 (05/22 0600) SpO2:  [93 %-98 %] 94 % (05/22 0600)  PHYSICAL EXAMINATION: General: chronically ill appearing female in NAD HEENT: MM pink/moist Neuro: AAOx4, speech clear, MAE CV: s1s2 rrr, no m/r/g PULM: even/non-labored, diminished on L, clear on R LN:LGXQ, non-tender, bsx4 active  Extremities: warm/dry, no edema  Skin: no rashes or lesions     Recent Labs Lab 08/16/16 0507 08/17/16 0041 08/18/16 0306  NA 137 134* 134*  K 4.6 4.1 4.0  CL 107 107 108  CO2 20* 19* 19*  BUN 27* 26* 20  CREATININE 2.32* 2.03* 1.57*  GLUCOSE 108* 214* 111*    Recent Labs Lab 08/16/16 0507  08/17/16 0041 08/17/16 1518 08/18/16 0306  HGB 6.3*  < > 7.4* 8.9* 9.6*  HCT 21.8*  < > 22.8* 28.5* 30.3*   WBC 11.8*  --  10.5  --  12.6*  PLT 594*  --  389  --  411*  < > = values in this interval not displayed. Ct Chest Wo Contrast  Result Date: 08/16/2016 CLINICAL DATA:  Drop in hemoglobin post thoracentesis. EXAM: CT CHEST WITHOUT CONTRAST TECHNIQUE: Multidetector CT imaging of the chest was performed following the standard protocol without IV contrast. COMPARISON:  06/17/2016 FINDINGS: Cardiovascular: Extensive aortic and coronary atheromatous calcifications. No aneurysm. Small pericardial effusion. Mediastinum/Nodes: 18 mm enlarging left suprahilar node, previously 14 mm. 1 cm pretracheal node, previously 7 mm. No axillary adenopathy. Lungs/Pleura: No pneumothorax. Small layering right pleural effusion. Moderately large hyperdense residual/recurrent left pleural effusion probably hemorrhagic. compressive atelectasis throughout the basilar segments of left lower lobe and inferior lingula. Bilateral pulmonary nodules with slight progression. Index right lower lobe nodule 19 mm image 99/5, previously 17 mm. Posterior 13 mm left upper lobe nodule image 50/5, previously 11 mm by my measurement. Additional smaller nodules bilaterally, several cavitary. Upper Abdomen: Previous right nephrectomy. Mixed attenuation left renal mass, incompletely visualized. Progressive inflammatory/edematous changes around pancreatic head and body, incompletely seen. Stable left adrenal enlargement. Musculoskeletal: Negative for fracture. IMPRESSION: 1. Moderately large  residual/recurrent hemorrhagic left effusion. 2. Progression of bilateral pulmonary nodules and mediastinal adenopathy. 3. Small pericardial and right pleural effusions. 4. New peripancreatic inflammatory/edematous  changes suggesting pancreatitis. 5. Aortic and coronary atherosclerosis. 6. Left renal and left adrenal masses again noted. Electronically Signed   By: Lucrezia Europe M.D.   On: 08/16/2016 10:46   Dg Chest Port 1 View  Result Date: 08/18/2016 CLINICAL DATA:   Pleural effusion. EXAM: PORTABLE CHEST 1 VIEW COMPARISON:  08/17/2016.  CT 08/16/2016. FINDINGS: Heart size stable Persistent large left pleural effusion, unchanged from prior exam. Small right pleural effusion again noted without interim change . Bibasilar atelectasis. Pulmonary nodules best identified by prior CT of 08/16/2016 . No pneumothorax. IMPRESSION: 1. Persistent large left pleural effusion. Small right pleural effusion. 2. Bibasilar atelectasis. Pulmonary nodules best identified by prior CT of 08/16/2016. Electronically Signed   By: Marcello Moores  Register   On: 08/18/2016 06:56   Dg Chest Port 1 View  Result Date: 08/17/2016 CLINICAL DATA:  66 year old female left-sided pleural effusion. Subsequent encounter. EXAM: PORTABLE CHEST 1 VIEW COMPARISON:  08/16/2016 CT and chest x-ray. FINDINGS: Large left-sided and small right-sided pleural effusion. Almost complete opacification left hemithorax. Left-sided pleural effusion has increased in size since prior exam. CT detected pulmonary nodules and adenopathy not as well delineated on present plain film exam. IMPRESSION: Increase in size of large left-sided pleural effusion. Almost complete opacification left hemithorax. Small right-sided pleural effusion. Electronically Signed   By: Genia Del M.D.   On: 08/17/2016 07:21   Dg Chest Port 1 View  Result Date: 08/16/2016 CLINICAL DATA:  Pleural effusion EXAM: PORTABLE CHEST 1 VIEW COMPARISON:  08/15/2016 FINDINGS: Moderate left pleural effusion again noted with left lower lobe atelectasis. Increasing left upper lobe airspace disease. Right basilar atelectasis or infiltrate. Mild cardiomegaly. IMPRESSION: Continued moderate left pleural effusion with left lower lobe atelectasis or infiltrate and right basilar opacity. Increasing left upper lobe airspace opacity. Electronically Signed   By: Rolm Baptise M.D.   On: 08/16/2016 08:43    SIGNIFICANT EVENTS  5/18  Admitted 5/19  Left thoracentesis 2 liters.  Exudate. Red/turbid.  5/21  PRBC per primary   STUDIES:  CT chest 5/20 >> Moderately large  residual/recurrent hemorrhagic left effusion. Progression of bilateral pulmonary nodules and mediastinal adenopathy. Small pericardial and right pleural effusions. New peripancreatic inflammatory/edematous changes suggesting pancreatitis.  Aortic and coronary atherosclerosis.  Left renal and left adrenal masses again noted. LE Duplex 5/21 >> negative for DVT  CULTURES / CYTOLOGY: L Pleural Fluid GS 5/19 >> negative  UC 5/18 >> negative  L Pleural Fluid Culture 5/19 >>  Cytology L Pleural 5/19 >>  ASSESSMENT / PLAN:  Recurrent Left Exudative Pleural Effusion - bloody appearing fluid, DDx malignant effusion vs iatrogenic hemothorax.  Hypodense and looks like could be some early loculation. Given description of effusion drained; rapidly progressive malignant effusion high on ddx, but also consider hemothorax given recent thora and having been on systemic anticoagulation.  P: Hold systemic anticoagulation > LE DVT negative  Await cytology  If pleural fluid has malignant cells, will need Pleur-X cath  Follow effusion size on CXR Hold thora for now   Hypovolemic vs hemorrhagic shock. Now resolved w/ IVFs and blood.  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 Now w/ mod/large left effusion.  Worrisome for malignant effusion P: Hold Votrient    Recent DVT/PE Feb 2018.  P: Hold anticoagulation  LE Doppler negative  No role for IVC filter at present  If needed, can likely go back on anticoagulation once need for PleurX catheter sorted out if needed from an oncology standpoint   Acute on  chronic renal failure (stage III CKD) P: Trend BMP / urinary output Replace electrolytes as indicated Avoid nephrotoxic agents, ensure adequate renal perfusion Avoid hypotension    DM type II P: Per primary SVC   Family - Patient updated on status.  Discussed case with Dr. Quincy Simmonds.  Have called Pathology regarding cytology results > await return call.   Noe Gens, NP-C [facility] Pgr: [phone] or if no answer [phone] 08/18/2016, 8:21 AM  Attending Note:  66 year old female with renal cell carcinoma with mets to the lungs presenting with pleural effusion and a hemothorax s/p thora.  Cytology remains pending.  On exam, lungs are clear on the right and diminished on the lefft.  I reviewed CXR myself, pleural effusion noted on the left.  Discussed with PCCM-NP.  Pleural effusion:  - Called cytology, awaiting response later today.  - I malignant then will need a pleurex catheter  - No further thora or CT for now  Hypoxemia:  - Titrate O2 for sat of 88-92%  - Will need an ambulatory desat study prior to discharge for ?of home O2 needs  Lungs mass: mets:  - Oncology to address  Atelectasis:  - IS per RT protocol  - PT evaluation  - Mobilize as able  PCCM will follow.  Patient seen and examined, agree with above note.  I dictated the care and orders written for this patient under my direction.  Rush Farmer, [place]

## 2016-08-18 NOTE — Progress Notes (Addendum)
PROGRESS NOTE [facility]   Laura Neal   EXH:[id number] DOB: 06-11-1950  DOA: 08/14/2016 PCP: Bernerd Limbo, MD   Brief Narrative:  66 year old female with medical history of recurrent renal cell carcinoma with known metastases to her left lung and adrenal gland presented to the emergency room complaining of nausea, vomiting, abdominal pain that started about a week PTA. Patient reported symptoms to her oncologist who recommended to stop her medications and report to the emergency department for an operation. In the ED she was found to be dehydrated with creatinine of 2.9 BUN of 34. Chest x-ray was done which showed moderate to large pleural effusion worsened from prior studies and a lactic acid level of 2.24. Patient admitted for IV hydration and further evaluation. Patient was tap on 08/15/16 which yielded 2 L of bloody fluids. Subsequently Hgb dropped and effusion worsened. CCM consulted now awaiting cytology result to make a decision of PleurX insertion vs chest tube.   Subjective: Patient seen and examined, today c/o left side chest pain at the procedure site, also SOB with moving. Denies cough or palpitations. Poor appetite. She was up with PT and tolerating well, BP soft. Hgb up to 9.6  Assessment & Plan: Acute kidney failure on chronic kidney disease stage III. In view of volume depletion and poor oral appetite. Cr back to baseline  Baseline creatinine about 1.5, creatinine upon admission 2.90 Encourage oral hydration Holding HCTZ, losartan and Votrient  Continue to monitor   Left pleural effusion, likely secondary to malignancy. Hemothorax ? Malignant vs Traumatic  Patient is status post ultrasound-guided thoracentesis 08/15/16 - Tap was bloody  Effusion continues to worsen PCCM recommendations appreciated  Fluid analysis pending - if malignant effusion patient would benefit form a PleurX cath  Repeat CXR in AM   Acute blood loss anemia ? Hemothorax from traumatic tap, in  view of anticoagulation  CT was ordered and shows persistent effusion  S/p 2 units of PRBC's  Hgb stable currently at 9.6 Holding anticoagulation and ASA  Hypotension - due blood loss  Some improvements after blood transfusion, but still soft  continues to be soft  Will give another NS bolus  Monitor BP   Type 2 diabetes mellitus - CBG's stable  Continue current regimen  Holding home medications Continue SSI and Lantus half dose Monitor CBG   History of DVT/PE A/c on hold due to bleeding   Moderate protein caloric malnutrition due to chronic illness PT consulted Dietitian recommendations appreciated  Renal cell carcinoma with lung metastasis  Oncology recommendations appreciated  Holding Votrient  See above   DVT prophylaxis: SCD's  Code Status: Full code Family Communication: None at bedside  Disposition Plan: Keep in SDU    Consultants:   Interventional radiology  PCCM   Procedures:   Ultrasound-guided thoracentesis 08/15/2016  Antimicrobials: Anti-infectives    None       Objective: Vitals:   08/18/16 0400 08/18/16 0403 08/18/16 0500 08/18/16 0600  BP: (!) 107/52  (!) 100/55 (!) 103/56  Pulse: (!) 108  (!) 109 (!) 109  Resp: 20  19 18   Temp:  98 F (36.7 C)    TempSrc:  Oral    SpO2: 94%  93% 94%  Weight:      Height:        Intake/Output Summary (Last 24 hours) at 08/18/16 0844 Last data filed at 08/17/16 1500  Gross per 24 hour  Intake           762.67 ml  Output                0 ml  Net           762.67 ml   Filed Weights   08/14/16 1612 08/14/16 2334 08/16/16 2200  Weight: 88 kg (194 lb) 82.6 kg (182 lb 1.6 oz) 89 kg (196 lb 3.4 oz)    Examination:  General exam: Calm and comfortable  Respiratory system: Decrease to almost abcsent breath sounds on the left, R lung good air entry  Cardiovascular system: S1S2 tachycardia @ 104. 2/6 SM  Gastrointestinal system: Abd soft NTD Central nervous system: Non focal  Extremities: No LE  edema Skin: No lesions   Data Reviewed: I have personally reviewed following labs and imaging studies  CBC:  Recent Labs Lab 08/14/16 1635 08/15/16 0201 08/16/16 0507 08/16/16 1824 08/17/16 0041 08/17/16 1518 08/18/16 0306  WBC 9.7 7.7 11.8*  --  10.5  --  12.6*  HGB 9.4* 8.7* 6.3* 7.2* 7.4* 8.9* 9.6*  HCT 31.2* 28.9* 21.8* 22.0* 22.8* 28.5* 30.3*  MCV 96.9 98.0 98.2  --  93.4  --  91.3  PLT 528* 465* 594*  --  389  --  992*   Basic Metabolic Panel:  Recent Labs Lab 08/14/16 1635 08/15/16 0201 08/16/16 0507 08/17/16 0041 08/18/16 0306  NA 139 140 137 134* 134*  K 3.6 3.7 4.6 4.1 4.0  CL 101 106 107 107 108  CO2 24 24 20* 19* 19*  GLUCOSE 186* 130* 108* 214* 111*  BUN 34* 30* 27* 26* 20  CREATININE 2.90* 2.46* 2.32* 2.03* 1.57*  CALCIUM 8.8* 7.9* 7.9* 7.7* 7.8*   GFR: Estimated Creatinine Clearance: 40.6 mL/min (A) (by C-G formula based on SCr of 1.57 mg/dL (H)). Liver Function Tests:  Recent Labs Lab 08/14/16 1635  AST 18  ALT 9*  ALKPHOS 92  BILITOT 0.7  PROT 7.4  ALBUMIN 2.7*    Recent Labs Lab 08/14/16 1635  LIPASE 24   No results for input(s): AMMONIA in the last 168 hours. Coagulation Profile:  Recent Labs Lab 08/14/16 1635 08/14/16 2336 08/15/16 0201  INR 1.98 1.96 1.83   Cardiac Enzymes: No results for input(s): CKTOTAL, CKMB, CKMBINDEX, TROPONINI in the last 168 hours. BNP (last 3 results) No results for input(s): PROBNP in the last 8760 hours. HbA1C: No results for input(s): HGBA1C in the last 72 hours. CBG:  Recent Labs Lab 08/16/16 2117 08/17/16 0739 08/17/16 1333 08/17/16 1606 08/17/16 2128  GLUCAP 166* 97 172* 141* 178*   Lipid Profile: No results for input(s): CHOL, HDL, LDLCALC, TRIG, CHOLHDL, LDLDIRECT in the last 72 hours. Thyroid Function Tests: No results for input(s): TSH, T4TOTAL, FREET4, T3FREE, THYROIDAB in the last 72 hours. Anemia Panel: No results for input(s): VITAMINB12, FOLATE, FERRITIN, TIBC,  IRON, RETICCTPCT in the last 72 hours. Sepsis Labs:  Recent Labs Lab 08/14/16 1748 08/14/16 2254 08/14/16 2255 08/15/16 0201  PROCALCITON  --  1.18  --   --   LATICACIDVEN 2.24*  --  2.1* 1.2    Recent Results (from the past 240 hour(s))  Urine culture     Status: None   Collection Time: 08/14/16 10:55 PM  Result Value Ref Range Status   Specimen Description URINE, RANDOM  Final   Special Requests NONE  Final   Culture   Final    NO GROWTH Performed at [facility], [place] [address], [place], [place] [address]    Report Status 08/16/2016 FINAL  Final  Culture, body fluid-bottle     Status: None (Preliminary result)   Collection Time: 08/15/16  1:40 PM  Result Value Ref Range Status   Specimen Description PLEURAL LEFT  Final   Special Requests BOTTLES DRAWN AEROBIC AND ANAEROBIC  Final   Culture   Final    NO GROWTH 2 DAYS Performed at [facility], [place] [address] [place], [place] [address]    Report Status PENDING  Incomplete  Gram stain     Status: None   Collection Time: 08/15/16  1:40 PM  Result Value Ref Range Status   Specimen Description PLEURAL LEFT  Final   Special Requests NONE  Final   Gram Stain   Final    FEW WBC PRESENT,BOTH PMN AND MONONUCLEAR NO ORGANISMS SEEN Performed at [facility], [address] [place], [place] [address]    Report Status 08/16/2016 FINAL  Final  MRSA PCR Screening     Status: None   Collection Time: 08/16/16 10:33 PM  Result Value Ref Range Status   MRSA by PCR NEGATIVE NEGATIVE Final    Comment:        The GeneXpert MRSA Assay (FDA approved for NASAL specimens only), is one component of a comprehensive MRSA colonization surveillance program. It is not intended to diagnose MRSA infection nor to guide or monitor treatment for MRSA infections.          Radiology Studies: Ct Chest Wo Contrast  Result Date: 08/16/2016 CLINICAL DATA:  Drop in hemoglobin post thoracentesis. EXAM: CT CHEST WITHOUT  CONTRAST TECHNIQUE: Multidetector CT imaging of the chest was performed following the standard protocol without IV contrast. COMPARISON:  06/17/2016 FINDINGS: Cardiovascular: Extensive aortic and coronary atheromatous calcifications. No aneurysm. Small pericardial effusion. Mediastinum/Nodes: 18 mm enlarging left suprahilar node, previously 14 mm. 1 cm pretracheal node, previously 7 mm. No axillary adenopathy. Lungs/Pleura: No pneumothorax. Small layering right pleural effusion. Moderately large hyperdense residual/recurrent left pleural effusion probably hemorrhagic. compressive atelectasis throughout the basilar segments of left lower lobe and inferior lingula. Bilateral pulmonary nodules with slight progression. Index right lower lobe nodule 19 mm image 99/5, previously 17 mm. Posterior 13 mm left upper lobe nodule image 50/5, previously 11 mm by my measurement. Additional smaller nodules bilaterally, several cavitary. Upper Abdomen: Previous right nephrectomy. Mixed attenuation left renal mass, incompletely visualized. Progressive inflammatory/edematous changes around pancreatic head and body, incompletely seen. Stable left adrenal enlargement. Musculoskeletal: Negative for fracture. IMPRESSION: 1. Moderately large  residual/recurrent hemorrhagic left effusion. 2. Progression of bilateral pulmonary nodules and mediastinal adenopathy. 3. Small pericardial and right pleural effusions. 4. New peripancreatic inflammatory/edematous changes suggesting pancreatitis. 5. Aortic and coronary atherosclerosis. 6. Left renal and left adrenal masses again noted. Electronically Signed   By: Lucrezia Europe M.D.   On: 08/16/2016 10:46   Dg Chest Port 1 View  Result Date: 08/18/2016 CLINICAL DATA:  Pleural effusion. EXAM: PORTABLE CHEST 1 VIEW COMPARISON:  08/17/2016.  CT 08/16/2016. FINDINGS: Heart size stable Persistent large left pleural effusion, unchanged from prior exam. Small right pleural effusion again noted without  interim change . Bibasilar atelectasis. Pulmonary nodules best identified by prior CT of 08/16/2016 . No pneumothorax. IMPRESSION: 1. Persistent large left pleural effusion. Small right pleural effusion. 2. Bibasilar atelectasis. Pulmonary nodules best identified by prior CT of 08/16/2016. Electronically Signed   By: Marcello Moores  Register   On: 08/18/2016 06:56   Dg Chest Port 1 View  Result Date: 08/17/2016 CLINICAL DATA:  66 year old female left-sided  pleural effusion. Subsequent encounter. EXAM: PORTABLE CHEST 1 VIEW COMPARISON:  08/16/2016 CT and chest x-ray. FINDINGS: Large left-sided and small right-sided pleural effusion. Almost complete opacification left hemithorax. Left-sided pleural effusion has increased in size since prior exam. CT detected pulmonary nodules and adenopathy not as well delineated on present plain film exam. IMPRESSION: Increase in size of large left-sided pleural effusion. Almost complete opacification left hemithorax. Small right-sided pleural effusion. Electronically Signed   By: Genia Del M.D.   On: 08/17/2016 07:21    Scheduled Meds: . feeding supplement  1 Container Oral TID BM  . insulin aspart  0-9 Units Subcutaneous TID WC  . insulin glargine  15 Units Subcutaneous Daily  . mouth rinse  15 mL Mouth Rinse BID  . mirtazapine  15 mg Oral QHS  . pantoprazole  40 mg Oral QODAY  . rosuvastatin  10 mg Oral QHS  . sodium chloride flush  3 mL Intravenous Q12H   Continuous Infusions: . sodium chloride       LOS: 4 days    Chipper Oman, MD Pager: Text Page via [email or web address]  [phone]  If 7PM-7AM, please contact night-coverage [email or web address] Password [facility] 08/18/2016, 8:44 AM

## 2016-08-18 NOTE — Progress Notes (Signed)
Pt became SOB with O2 sat in high 80's after using the bedside commode. Placed on 2LNC and O2 sat increased into mid-high 90's. Other VSS. Will continue to monitor.

## 2016-08-19 ENCOUNTER — Inpatient Hospital Stay (HOSPITAL_COMMUNITY): Payer: Medicare Other

## 2016-08-19 ENCOUNTER — Ambulatory Visit: Payer: Medicare Other | Admitting: Physician Assistant

## 2016-08-19 LAB — GLUCOSE, PLEURAL OR PERITONEAL FLUID: Glucose, Fluid: 115 mg/dL

## 2016-08-19 LAB — BASIC METABOLIC PANEL
ANION GAP: 8 (ref 5–15)
BUN: 17 mg/dL (ref 6–20)
CALCIUM: 7.9 mg/dL — AB (ref 8.9–10.3)
CO2: 19 mmol/L — ABNORMAL LOW (ref 22–32)
Chloride: 111 mmol/L (ref 101–111)
Creatinine, Ser: 1.28 mg/dL — ABNORMAL HIGH (ref 0.44–1.00)
GFR, EST AFRICAN AMERICAN: 49 mL/min — AB (ref >60)
GFR, EST NON AFRICAN AMERICAN: 42 mL/min — AB (ref >60)
GLUCOSE: 82 mg/dL (ref 65–99)
Potassium: 4.2 mmol/L (ref 3.5–5.1)
SODIUM: 138 mmol/L (ref 135–145)

## 2016-08-19 LAB — BODY FLUID CELL COUNT WITH DIFFERENTIAL
Lymphs, Fluid: 1 %
Monocyte-Macrophage-Serous Fluid: 13 % — ABNORMAL LOW (ref 50–90)
Neutrophil Count, Fluid: 86 % — ABNORMAL HIGH (ref 0–25)
WBC FLUID: 2069 uL — AB (ref 0–1000)

## 2016-08-19 LAB — CBC
HCT: 27.6 % — ABNORMAL LOW (ref 36.0–46.0)
Hemoglobin: 8.9 g/dL — ABNORMAL LOW (ref 12.0–15.0)
MCH: 29.7 pg (ref 26.0–34.0)
MCHC: 32.2 g/dL (ref 30.0–36.0)
MCV: 92 fL (ref 78.0–100.0)
PLATELETS: 403 10*3/uL — AB (ref 150–400)
RBC: 3 MIL/uL — AB (ref 3.87–5.11)
RDW: 19 % — AB (ref 11.5–15.5)
WBC: 11.1 10*3/uL — AB (ref 4.0–10.5)

## 2016-08-19 LAB — GLUCOSE, CAPILLARY
GLUCOSE-CAPILLARY: 183 mg/dL — AB (ref 65–99)
Glucose-Capillary: 50 mg/dL — ABNORMAL LOW (ref 65–99)
Glucose-Capillary: 51 mg/dL — ABNORMAL LOW (ref 65–99)
Glucose-Capillary: 133 mg/dL — ABNORMAL HIGH (ref 65–99)
Glucose-Capillary: 123 mg/dL — ABNORMAL HIGH (ref 65–99)
Glucose-Capillary: 139 mg/dL — ABNORMAL HIGH (ref 65–99)

## 2016-08-19 LAB — LACTATE DEHYDROGENASE: LDH: 139 U/L (ref 98–192)

## 2016-08-19 LAB — PROTEIN, TOTAL: Total Protein: 5.2 g/dL — ABNORMAL LOW (ref 6.5–8.1)

## 2016-08-19 MED ORDER — INSULIN GLARGINE 100 UNIT/ML ~~LOC~~ SOLN
12.0000 [IU] | Freq: Every day | SUBCUTANEOUS | Status: DC
  Administered 2016-08-20: 12 [IU] via SUBCUTANEOUS
  Filled 2016-08-19: qty 0.12

## 2016-08-19 NOTE — Progress Notes (Signed)
Name: Laura Neal MRN: [id number] DOB: [birth year]    ADMISSION DATE:  08/14/2016 CONSULTATION DATE:  5/20  REFERRING MD :  Quincy Simmonds   CHIEF COMPLAINT: recurrent pleural effusion with acute blood loss   BRIEF SUMMARY:   66 year old female w/ h/o RCC (on pazopanib) w/ known mets to left lung and adrenals (had stable disease as of March 2018), prior DVT was on NOAC. Admitted 5/18 w/ cc: ~ 1 week h/o N/V and abd discomfort. In ER found to be dehydrated, renal fxn had decline and found to have large to mod sized left effusion. Her NOAC was held. Placed on heparin gtt and then she underwent left thoracentesis on 5/19. 2 liters of bloody fluid was evacuated fluid thus far exudate & cytology remains pending. On 5/20 am hgb had drifted from 8.7 to 6.3 BP as low as 80s. Imaging by CXR and CT suggesting either re-occurrence vs reaccumulation of left effusion. PCCM asked to evaluate    SUBJECTIVE:  Pt reports feeling much more "winded" today.  Note episode of desaturation to 80's with exertion.    VITAL SIGNS: Temp:  [98 F (36.7 C)-98.7 F (37.1 C)] 98.7 F (37.1 C) (05/23 0400) Pulse Rate:  [92-124] 103 (05/23 0700) Resp:  [19-32] 19 (05/23 0700) BP: (87-120)/(42-66) 87/53 (05/23 0700) SpO2:  [92 %-100 %] 97 % (05/23 0700) Weight:  [199 lb 8.3 oz (90.5 kg)] 199 lb 8.3 oz (90.5 kg) (05/23 0400)  PHYSICAL EXAMINATION: General:  Chronically ill appearing female in NAD HEENT: MM pink/moist, no jvd PSY: calm/appropriate Neuro: AAOx4, speech clear, MAE  CV: s1s2 rrr, no m/r/g PULM: even/non-labored, lungs bilaterally diminished L>R WU:JWJX, non-tender, bsx4 active  Extremities: warm/dry, no edema  Skin: no rashes or lesions     Recent Labs Lab 08/17/16 0041 08/18/16 0306 08/19/16 0307  NA 134* 134* 138  K 4.1 4.0 4.2  CL 107 108 111  CO2 19* 19* 19*  BUN 26* 20 17  CREATININE 2.03* 1.57* 1.28*  GLUCOSE 214* 111* 82    Recent Labs Lab 08/17/16 0041 08/17/16 1518  08/18/16 0306 08/19/16 0307  HGB 7.4* 8.9* 9.6* 8.9*  HCT 22.8* 28.5* 30.3* 27.6*  WBC 10.5  --  12.6* 11.1*  PLT 389  --  411* 403*   Dg Chest Port 1 View  Result Date: 08/19/2016 CLINICAL DATA:  Pleural effusion. EXAM: PORTABLE CHEST 1 VIEW COMPARISON:  08/18/2016.  CT 08/16/2016. FINDINGS: Heart size stable. Persistent large left pleural effusion, unchanged from prior exam. Small right pleural effusion again noted unchanged. Basilar atelectasis. Pulmonary nodules present best identified by prior CT. No pneumothorax. IMPRESSION: 1. Stable chest with large left pleural effusion and small right pleural effusion. 2. Basilar atelectasis. Pulmonary nodules present best identified by prior CT of 05/ 20/2018 . Electronically Signed   By: Marcello Moores  Register   On: 08/19/2016 07:16   Dg Chest Port 1 View  Result Date: 08/18/2016 CLINICAL DATA:  Pleural effusion. EXAM: PORTABLE CHEST 1 VIEW COMPARISON:  08/17/2016.  CT 08/16/2016. FINDINGS: Heart size stable Persistent large left pleural effusion, unchanged from prior exam. Small right pleural effusion again noted without interim change . Bibasilar atelectasis. Pulmonary nodules best identified by prior CT of 08/16/2016 . No pneumothorax. IMPRESSION: 1. Persistent large left pleural effusion. Small right pleural effusion. 2. Bibasilar atelectasis. Pulmonary nodules best identified by prior CT of 08/16/2016. Electronically Signed   By: Marcello Moores  Register   On: 08/18/2016 06:56    SIGNIFICANT EVENTS  5/18  Admitted 5/19  Left thoracentesis 2 liters. Exudate. Red/turbid.  5/21  PRBC per primary   STUDIES:  CT chest 5/20 >> Moderately large  residual/recurrent hemorrhagic left effusion. Progression of bilateral pulmonary nodules and mediastinal adenopathy. Small pericardial and right pleural effusions. New peripancreatic inflammatory/edematous changes suggesting pancreatitis.  Aortic and coronary atherosclerosis.  Left renal and left adrenal masses again  noted. LE Duplex 5/21 >> negative for DVT  CULTURES / CYTOLOGY: L Pleural Fluid GS 5/19 >> negative  UC 5/18 >> negative  L Pleural Fluid Culture 5/19 >>  Cytology L Pleural 5/19 >> rare atypical cells, suspicious for malignancy  ASSESSMENT / PLAN:  Recurrent Left Exudative Pleural Effusion - bloody appearing fluid, DDx malignant effusion vs iatrogenic hemothorax.  Hypodense and looks like could be some early loculation. Given description of effusion drained; rapidly progressive malignant effusion high on ddx, but also consider hemothorax given recent thora and having been on systemic anticoagulation.  P: Plan for repeat thoracentesis 5/23  Repeat Cytology, Hct, glucose on pleural fluid Hold systemic anticoagulation   Hypovolemic vs hemorrhagic shock. Now resolved w/ IVFs and blood.  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 Now w/ mod/large left effusion. Worrisome for malignant effusion P: Hold Votrient   Recent DVT/PE Feb 2018.  P: Hold anticoagulation  LE doppler negative  No role for IVC filter at present  If needed from an ONC standpoint, can likely go back on anticoagulation once neef for PleurX catheter sorted out   Acute on chronic renal failure (stage III CKD) P: Trend BMP / urinary output Replace electrolytes as indicated Avoid nephrotoxic agents, ensure adequate renal perfusion   DM type II P: Per primary SVC   Family - Patient updated on status > including cytology results. Consented for left thoracentesis to further evaluate for malignant cells.  Questions answered.    Noe Gens, NP-C [facility] Pgr: [phone] or if no  answer [phone] 08/19/2016, 9:50 AM  Attending Note:  66 year old female with renal cell carcinoma with mets to the lungs presenting with pleural effusion and a hemothorax s/p thora.  Cytology read as suspicious for malignancy but no malignant cells seen on cell block.  On exam, patient is comfortable with decreased BS on the left.  I reviewed CXR myself, pleural effusions noted.  Discussed with PCCM-NP.  Pleural effusion:  - Repeat thora today  - Send for cytology  - CXR post thora  Hypoxemia:  - Titrate O2 for sat of 88-92%  - Will need an ambulatory desat study prior to discharge for ?home O2 needs.  Lung mass: mets  - Oncology to address  Atelectasis:  - IS per RT protocol  - PT evaluation  - Mobilize as able  PCCM will follow.  Patient seen and examined, agree with above note.  I dictated the care and orders written for this patient under my direction.  Rush Farmer, [place]

## 2016-08-19 NOTE — Progress Notes (Signed)
PT Cancellation Note  Patient Details Name: Laura Neal MRN: [id number] DOB: 02/16/1951   Cancelled Treatment:    Reason Eval/Treat Not Completed: Other (comment)  RN reports thoracentesis this morning and pt feeling weak and fatigued.  Pt sleeping on arrival.  Called pt's name x3 however she did not awaken.  Will allow pt to rest and check back as schedule permits.  Diana Armijo,KATHrine E 08/19/2016, 1:21 PM Carmelia Bake, PT, DPT 08/19/2016 Pager: [phone]

## 2016-08-19 NOTE — Progress Notes (Signed)
Inpatient Diabetes Program Recommendations  AACE/ADA: New Consensus Statement on Inpatient Glycemic Control (2015)  Target Ranges:  Prepandial:   less than 140 mg/dL      Peak postprandial:   less than 180 mg/dL (1-2 hours)      Critically ill patients:  140 - 180 mg/dL   Results for HAILEA, EAGLIN (MRN [id number]) as of 08/19/2016 11:02  Ref. Range 08/19/2016 08:21 08/19/2016 08:21 08/19/2016 09:11  Glucose-Capillary Latest Ref Range: 65 - 99 mg/dL 51 (L) 50 (L) 133 (H)    Home DM Meds: Lantus 30 units QHS       Byetta 10 mcg BID  Current Insulin Orders: Lantus 15 units daily      Novolog Sensitive Correction Scale/ SSI (0-9 units) TID AC      MD- Note patient with Hypoglycemia this AM.  Please consider reducing Lantus to 12 units daily (20% dose reduction)     --Will follow patient during hospitalization--  Wyn Quaker RN, MSN, CDE Diabetes Coordinator Inpatient Glycemic Control Team Team Pager: [phone] (8a-5p)

## 2016-08-19 NOTE — Procedures (Signed)
Thoracentesis Procedure Note  Pre-operative Diagnosis: Left sided pleural efusion  Post-operative Diagnosis: same  Indications: Left sided pleural effusion  Procedure Details  Consent: Informed consent was obtained. Risks of the procedure were discussed including: infection, bleeding, pain, pneumothorax.  Under sterile conditions the patient was positioned. Betadine solution and sterile drapes were utilized.  1% plain lidocaine was used to anesthetize the 5 rib space. Fluid was obtained without any difficulties and minimal blood loss.  A dressing was applied to the wound and wound care instructions were provided.   Findings 200 ml of bloody thick pleural fluid was obtained. A sample was sent to Pathology for cytogenetics, flow, and cell counts, as well as for infection analysis.  Obtained 200 ml for cytologic evaluation.  Complications:  None; patient tolerated the procedure well.          Condition: stable  Plan A follow up chest x-ray was ordered. Bed Rest for 0 hours. Tylenol 650 mg. for pain.  Attending Attestation: I was present for the entire procedure.

## 2016-08-19 NOTE — Progress Notes (Signed)
Nutrition Follow-up  DOCUMENTATION CODES:   Severe malnutrition in context of chronic illness  INTERVENTION:  - Continue Boost Breeze TID. - Continue to encourage PO intakes of meals, supplements, and beverages. - RD will continue to monitor for additional needs.  NUTRITION DIAGNOSIS:   Malnutrition related to chronic illness, cancer and cancer related treatments as evidenced by percent weight loss, energy intake < or equal to 75% for > or equal to 1 month. -ongoing  GOAL:   Patient will meet greater than or equal to 90% of their needs -unmet on average  MONITOR:   PO intake, Supplement acceptance, Labs, Weight trends, Skin, I & O's  ASSESSMENT:   66 year old female with medical history of recurrent renal cell carcinoma with known metastases to her left lung and adrenal gland presented to the emergency room complaining of nausea, vomiting, abdominal pain that started about a week PTA. Patient reported symptoms to her oncologist who recommended to stop her medications and report to the emergency department for an operation. In the ED she was found to be dehydrated with creatinine of 2.9 BUN of 34. Chest x-ray was done which showed moderate to large pleural effusion worsened from prior studies and a lactic acid level of 2.24. Patient admitted for IV hydration and further evaluation.  5/23 No intakes documented since previous assessment. Weight +1.5 kg from 5/20. Pt had repeat thoracentesis shortly before 11 AM today. She states that since previous RD assessment her appetite has been "so-so." For breakfast this AM she had oatmeal, pudding, and Boost Breeze which she was able to tolerate without issue. She denies abdominal pain or nausea with PO intakes but states that when she has tried to drink [employer] on an empty stomach she experiences a cramping sensation; she has otherwise been enjoying this supplement.  Medications reviewed; sliding scale Novolog, 15 units Lantus/day, 40 mg oral  Protonix every other day. Labs reviewed; CBGs: 50 and 133 mg/dL today, creatinine: 1.28 mg/dL, Ca: 7.9 mg/dL.   IVF: NS @ 75 mL/hr.    5/20 - Pt reports her appetite has continued to have poor appetite and eats snacks twice a day (cottage cheese & peaches, cereal and fruit, etc.) - Pt was having N/V for about a week PTA.  - Per chart review, pt spoke with a RD at [facility] who provided high calorie, high protein diet education for patient.  - Pt reports that she cannot tolerate Ensure/Boost supplements.  - She is wanting to try [employer]. - Since admission, pt has been consuming ~50-75% of meals.  - Pt ate cheerios and fresh fruit cup for breakfast this morning. Pt denies nausea today. - Per chart review, pt has lost 18 lb since 4/11 (9% wt loss x 1.5 months, significant for time frame).  - Nutrition focused physical exam shows no sign of depletion of muscle mass or body fat.   Diet Order:  Diet Carb Modified Fluid consistency: Thin; Room service appropriate? Yes  Skin:  Wound (see comment) (Stage 2 sacral and R buttocks pressure injuries)  Last BM:  5/21  Height:   Ht Readings from Last 1 Encounters:  08/16/16 5\' 8"  (1.727 m)    Weight:   Wt Readings from Last 1 Encounters:  08/19/16 199 lb 8.3 oz (90.5 kg)    Ideal Body Weight:  63.6 kg  BMI:  Body mass index is 30.34 kg/m.  Estimated Nutritional Needs:   Kcal:  2000-2200  Protein:  95-105g  Fluid:  2L/day  EDUCATION  NEEDS:   No education needs identified at this time    Jarome Matin, MS, RD, LDN, [facility] Inpatient Clinical Dietitian Pager # [phone] After hours/weekend pager # [phone]

## 2016-08-19 NOTE — Progress Notes (Signed)
Patient ID: Laura Neal, female   DOB: 1950-04-06, 66 y.o.   MRN: [id number]  PROGRESS NOTE    KALKIDAN CAUDELL  LGX:[id number] DOB: [birth year] DOA: 08/14/2016 PCP: Bernerd Limbo, MD   Brief Narrative: 66 year old female with history of recurrent renal cell carcinoma with known metastases to her left lung and adrenal gland presented to the emergency room complaining of nausea, vomiting, abdominal pain that started about a week PTA. Patient reported symptoms to her oncologist who recommended to stop her medications and report to the emergency department. In the ED she was found to be dehydrated with creatinine of 2.9 BUN of 34. Chest x-ray was done which showed moderate to large pleural effusion worsened from prior studies and a lactic acid level of 2.24. Patient was admitted for IV hydration and further evaluation. Patient had thoracentesis on 08/15/16 which yielded 2 L of bloody fluids. Subsequently Hgb dropped and effusion worsened. CCM consulted now awaiting cytology result to make a decision of PleurX insertion vs chest tube.   Assessment & Plan:   Principal Problem:   AKI (acute kidney injury) ([place]) Active Problems:   Type 2 diabetes mellitus with stage 3 chronic kidney disease, without long-term current use of insulin (HCC)   Pulmonary embolus (HCC)   Malignant neoplasm metastatic to left lung (HCC)   Moderate protein-calorie malnutrition (HCC)   Pleural effusion on left   Acute kidney injury ([place])   Pressure injury of skin   Pleural effusion   Atelectasis   Hypoxemia  Acute kidney injury on chronic kidney disease stage III. In view of volume depletion and poor oral appetite.  Cr back to baseline. Today creatinine is 1.28 Baseline creatinine about 1.5, creatinine upon admission 2.90.  Holding HCTZ, losartan and Votrient  Continue to monitor   Left pleural effusion, likely secondary to malignancy. Hemothorax ? Malignant vs Traumatic  Patient is status post ultrasound-guided  thoracentesis 08/15/16 - Tap was bloody  Effusion continues to worsen We'll follow further recommendations from PCCM: Plan for repeat thoracentesis today Fluid analysis negative for malignant cells   Acute blood loss anemia ? Hemothorax from traumatic tap, in view of anticoagulation  S/p 2 units of PRBC's  Hgb stable currently at 8.9  Holding off on anticoagulation and ASA  Hypotension - probably due to blood loss  Improving but still borderline low  Monitor BP; hold antihypertensives for now  Type 2 diabetes mellitus -  With hypoglycemia this a.m. Holding home medications Continue SSI and decrease Lantus to 12 units daily dose Monitor CBG   History of DVT/PE A/c on hold due to bleeding   Moderate protein caloric malnutrition due to chronic illness PT consulted Dietitian recommendations appreciated  Renal cell carcinoma with lung metastasis  Oncology recommendations appreciated  Holding Votrient   Leukocytosis Probably reactive. Repeat a.m. labs  DVT prophylaxis: SCD's  Code Status: Full code Family Communication: None at bedside  Disposition Plan: Keep in SDU    Consultants:   Interventional radiology  PCCM   Procedures:   Ultrasound-guided thoracentesis 08/15/2016  Antimicrobials: None    Subjective: Patient seen and examined at bedside. She still feels very weak with some shortness of breath. Her appetite is poor. No overnight fever, nausea, vomiting   Objective: Vitals:   08/19/16 0800 08/19/16 0900 08/19/16 1000 08/19/16 1100  BP: (!) 90/53 105/78  122/61  Pulse: (!) 101 (!) 113 (!) 125 (!) 118  Resp: 19 (!) 26 (!) 23 (!) 26  Temp: 98.3 F (36.8 C)  TempSrc: Oral     SpO2: 98% (!) 83% 92% 100%  Weight:      Height:        Intake/Output Summary (Last 24 hours) at 08/19/16 1234 Last data filed at 08/19/16 1100  Gross per 24 hour  Intake          1510.42 ml  Output              250 ml  Net          1260.42 ml   Filed Weights     08/14/16 2334 08/16/16 2200 08/19/16 0400  Weight: 82.6 kg (182 lb 1.6 oz) 89 kg (196 lb 3.4 oz) 90.5 kg (199 lb 8.3 oz)    Examination:  General exam: Appears calm and comfortable  Respiratory system: Bilateral decreased breath sound at bases with scattered crackles Cardiovascular system: S1 & S2 heard, tachycardic  Gastrointestinal system: Abdomen is nondistended, soft and nontender. Normal bowel sounds heard. Extremities: No cyanosis, clubbing, trace pedal edema    Data Reviewed: I have personally reviewed following labs and imaging studies  CBC:  Recent Labs Lab 08/15/16 0201 08/16/16 0507 08/16/16 1824 08/17/16 0041 08/17/16 1518 08/18/16 0306 08/19/16 0307  WBC 7.7 11.8*  --  10.5  --  12.6* 11.1*  HGB 8.7* 6.3* 7.2* 7.4* 8.9* 9.6* 8.9*  HCT 28.9* 21.8* 22.0* 22.8* 28.5* 30.3* 27.6*  MCV 98.0 98.2  --  93.4  --  91.3 92.0  PLT 465* 594*  --  389  --  411* 053*   Basic Metabolic Panel:  Recent Labs Lab 08/15/16 0201 08/16/16 0507 08/17/16 0041 08/18/16 0306 08/19/16 0307  NA 140 137 134* 134* 138  K 3.7 4.6 4.1 4.0 4.2  CL 106 107 107 108 111  CO2 24 20* 19* 19* 19*  GLUCOSE 130* 108* 214* 111* 82  BUN 30* 27* 26* 20 17  CREATININE 2.46* 2.32* 2.03* 1.57* 1.28*  CALCIUM 7.9* 7.9* 7.7* 7.8* 7.9*   GFR: Estimated Creatinine Clearance: 50.2 mL/min (A) (by C-G formula based on SCr of 1.28 mg/dL (H)). Liver Function Tests:  Recent Labs Lab 08/14/16 1635 08/19/16 1016  AST 18  --   ALT 9*  --   ALKPHOS 92  --   BILITOT 0.7  --   PROT 7.4 5.2*  ALBUMIN 2.7*  --     Recent Labs Lab 08/14/16 1635  LIPASE 24   No results for input(s): AMMONIA in the last 168 hours. Coagulation Profile:  Recent Labs Lab 08/14/16 1635 08/14/16 2336 08/15/16 0201  INR 1.98 1.96 1.83   Cardiac Enzymes: No results for input(s): CKTOTAL, CKMB, CKMBINDEX, TROPONINI in the last 168 hours. BNP (last 3 results) No results for input(s): PROBNP in the last 8760  hours. HbA1C: No results for input(s): HGBA1C in the last 72 hours. CBG:  Recent Labs Lab 08/18/16 1710 08/18/16 2204 08/19/16 0821 08/19/16 0911 08/19/16 1219  GLUCAP 99 113* 50*  51* 133* 183*   Lipid Profile: No results for input(s): CHOL, HDL, LDLCALC, TRIG, CHOLHDL, LDLDIRECT in the last 72 hours. Thyroid Function Tests: No results for input(s): TSH, T4TOTAL, FREET4, T3FREE, THYROIDAB in the last 72 hours. Anemia Panel: No results for input(s): VITAMINB12, FOLATE, FERRITIN, TIBC, IRON, RETICCTPCT in the last 72 hours. Sepsis Labs:  Recent Labs Lab 08/14/16 1748 08/14/16 2254 08/14/16 2255 08/15/16 0201  PROCALCITON  --  1.18  --   --   LATICACIDVEN 2.24*  --  2.1* 1.2  Recent Results (from the past 240 hour(s))  Urine culture     Status: None   Collection Time: 08/14/16 10:55 PM  Result Value Ref Range Status   Specimen Description URINE, RANDOM  Final   Special Requests NONE  Final   Culture   Final    NO GROWTH Performed at [facility], [address], [place], [place] [address]    Report Status 08/16/2016 FINAL  Final  Culture, body fluid-bottle     Status: None (Preliminary result)   Collection Time: 08/15/16  1:40 PM  Result Value Ref Range Status   Specimen Description PLEURAL LEFT  Final   Special Requests BOTTLES DRAWN AEROBIC AND ANAEROBIC  Final   Culture   Final    NO GROWTH 4 DAYS Performed at [facility], [place] [address], [place], [place] [address]    Report Status PENDING  Incomplete  Gram stain     Status: None   Collection Time: 08/15/16  1:40 PM  Result Value Ref Range Status   Specimen Description PLEURAL LEFT  Final   Special Requests NONE  Final   Gram Stain   Final    FEW WBC PRESENT,BOTH PMN AND MONONUCLEAR NO ORGANISMS SEEN Performed at [facility], [address] [place], [place] [address]    Report Status 08/16/2016 FINAL  Final  MRSA PCR Screening     Status: None   Collection Time: 08/16/16 10:33  PM  Result Value Ref Range Status   MRSA by PCR NEGATIVE NEGATIVE Final    Comment:        The GeneXpert MRSA Assay (FDA approved for NASAL specimens only), is one component of a comprehensive MRSA colonization surveillance program. It is not intended to diagnose MRSA infection nor to guide or monitor treatment for MRSA infections.          Radiology Studies: Dg Chest Port 1 View  Result Date: 08/19/2016 CLINICAL DATA:  Post left thoracentesis EXAM: PORTABLE CHEST 1 VIEW COMPARISON:  08/19/2016 FINDINGS: Continued large left pleural effusion, slightly decreased since prior study with slight increase in aeration of the left lung. No pneumothorax. Small right pleural effusion with right lower lobe atelectasis or infiltrate is stable. IMPRESSION: Slight decreased size of the large left pleural effusion following thoracentesis. No pneumothorax. Otherwise no change. Electronically Signed   By: Rolm Baptise M.D.   On: 08/19/2016 11:46   Dg Chest Port 1 View  Result Date: 08/19/2016 CLINICAL DATA:  Pleural effusion. EXAM: PORTABLE CHEST 1 VIEW COMPARISON:  08/18/2016.  CT 08/16/2016. FINDINGS: Heart size stable. Persistent large left pleural effusion, unchanged from prior exam. Small right pleural effusion again noted unchanged. Basilar atelectasis. Pulmonary nodules present best identified by prior CT. No pneumothorax. IMPRESSION: 1. Stable chest with large left pleural effusion and small right pleural effusion. 2. Basilar atelectasis. Pulmonary nodules present best identified by prior CT of 05/ 20/2018 . Electronically Signed   By: Marcello Moores  Register   On: 08/19/2016 07:16   Dg Chest Port 1 View  Result Date: 08/18/2016 CLINICAL DATA:  Pleural effusion. EXAM: PORTABLE CHEST 1 VIEW COMPARISON:  08/17/2016.  CT 08/16/2016. FINDINGS: Heart size stable Persistent large left pleural effusion, unchanged from prior exam. Small right pleural effusion again noted without interim change . Bibasilar  atelectasis. Pulmonary nodules best identified by prior CT of 08/16/2016 . No pneumothorax. IMPRESSION: 1. Persistent large left pleural effusion. Small right pleural effusion. 2. Bibasilar atelectasis. Pulmonary nodules best identified by  prior CT of 08/16/2016. Electronically Signed   By: Marcello Moores  Register   On: 08/18/2016 06:56        Scheduled Meds: . feeding supplement  1 Container Oral TID BM  . insulin aspart  0-9 Units Subcutaneous TID WC  . insulin glargine  15 Units Subcutaneous Daily  . mouth rinse  15 mL Mouth Rinse BID  . mirtazapine  15 mg Oral QHS  . pantoprazole  40 mg Oral QODAY  . rosuvastatin  10 mg Oral QHS  . sodium chloride flush  3 mL Intravenous Q12H   Continuous Infusions: . sodium chloride 75 mL/hr at 08/19/16 1100     LOS: 5 days        Aline August, MD [facility] Pager [phone]  If 7PM-7AM, please contact night-coverage [email or web address] Password [facility] 08/19/2016, 12:34 PM

## 2016-08-19 NOTE — Progress Notes (Signed)
[place] Progress Note Patient Name: Laura Neal DOB: [birth year] MRN: [id number]   Date of Service  08/19/2016  HPI/Events of Note  Notified of need for DVT prophylaxis - Hx of recurrent hemorrhagic pleural effusion.   eICU Interventions  Will order: 1. Place SCD's to bilateral lower extremities.      Intervention Category Intermediate Interventions: Best-practice therapies (e.g. DVT, beta blocker, etc.)  [name] 08/19/2016, 11:31 PM

## 2016-08-20 DIAGNOSIS — C649 Malignant neoplasm of unspecified kidney, except renal pelvis: Secondary | ICD-10-CM

## 2016-08-20 DIAGNOSIS — R0609 Other forms of dyspnea: Secondary | ICD-10-CM

## 2016-08-20 LAB — CULTURE, BODY FLUID-BOTTLE: CULTURE: NO GROWTH

## 2016-08-20 LAB — CBC
HCT: 28.2 % — ABNORMAL LOW (ref 36.0–46.0)
Hemoglobin: 8.9 g/dL — ABNORMAL LOW (ref 12.0–15.0)
MCH: 29.5 pg (ref 26.0–34.0)
MCHC: 31.6 g/dL (ref 30.0–36.0)
MCV: 93.4 fL (ref 78.0–100.0)
PLATELETS: 512 10*3/uL — AB (ref 150–400)
RBC: 3.02 MIL/uL — AB (ref 3.87–5.11)
RDW: 18.8 % — AB (ref 11.5–15.5)
WBC: 10.2 10*3/uL (ref 4.0–10.5)

## 2016-08-20 LAB — BASIC METABOLIC PANEL
Anion gap: 8 (ref 5–15)
BUN: 13 mg/dL (ref 6–20)
CALCIUM: 8.3 mg/dL — AB (ref 8.9–10.3)
CO2: 18 mmol/L — ABNORMAL LOW (ref 22–32)
CREATININE: 1.27 mg/dL — AB (ref 0.44–1.00)
Chloride: 111 mmol/L (ref 101–111)
GFR calc Af Amer: 49 mL/min — ABNORMAL LOW (ref >60)
GFR, EST NON AFRICAN AMERICAN: 43 mL/min — AB (ref >60)
GLUCOSE: 87 mg/dL (ref 65–99)
Potassium: 4.4 mmol/L (ref 3.5–5.1)
Sodium: 137 mmol/L (ref 135–145)

## 2016-08-20 LAB — GLUCOSE, CAPILLARY
GLUCOSE-CAPILLARY: 72 mg/dL (ref 65–99)
Glucose-Capillary: 63 mg/dL — ABNORMAL LOW (ref 65–99)
Glucose-Capillary: 91 mg/dL (ref 65–99)
Glucose-Capillary: 101 mg/dL — ABNORMAL HIGH (ref 65–99)
Glucose-Capillary: 101 mg/dL — ABNORMAL HIGH (ref 65–99)

## 2016-08-20 LAB — CULTURE, BODY FLUID W GRAM STAIN -BOTTLE

## 2016-08-20 LAB — MAGNESIUM: MAGNESIUM: 1.8 mg/dL (ref 1.7–2.4)

## 2016-08-20 MED ORDER — INSULIN GLARGINE 100 UNIT/ML ~~LOC~~ SOLN
10.0000 [IU] | Freq: Every day | SUBCUTANEOUS | Status: DC
  Administered 2016-08-22 – 2016-08-27 (×5): 10 [IU] via SUBCUTANEOUS
  Filled 2016-08-20 (×7): qty 0.1

## 2016-08-20 NOTE — Progress Notes (Addendum)
Patient ID: Laura Neal, female   DOB: 1951-02-27, 66 y.o.   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 Patient reported symptoms to her oncologist who recommended to stop her medications and report to the emergency department. In the ED she was found to be dehydrated with creatinine of 2.9 BUN of 34. Chest x-ray was done which showed moderate to large pleural effusion worsened from prior studies and a lactic acid level of 2.24. Patient was admitted for IV hydration and further evaluation. Patient had thoracentesis on 08/15/16 which yielded 2 L of bloody fluids. Subsequently Hgb dropped and effusion worsened. CCM consulted. Pleural fluid cytology was negative for malignant cells. She had a repeat thoracentesis done yesterday at bedside by PCCM, results are pending.     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 In view of volume depletion and poor oral appetite.  Cr back to baseline. Today creatinine is 1.27 Baseline creatinine about 1.5, creatinine upon admission 2.90.  Holding HCTZ, losartan and Votrient  Continue to monitor   Left pleural effusion, likely secondary to malignancy. Hemothorax ?  Malignant vs Traumatic  Patient is status post ultrasound-guided thoracentesis 08/15/16 - Tap was bloody  Effusion continues toworsen We'll follow further recommendations from PCCM: Patient had repeat thoracentesis done yesterday on 08/19/2016, results are pending. Initial fluid analysis was suspicious for malignant cells   Acute blood loss anemia ? Hemothorax from traumatic tap, in view of anticoagulation  S/p 2 units of PRBC's  Hgb stable currently at 8.9  Holding off on anticoagulation and ASA  Hypotension- probably due to blood loss  Improving but still borderline low  Monitor BP; hold antihypertensives for now  Type 2 diabetes mellitus -  Holding home medications Continue SSI and Lantus Monitor CBG  History of DVT/PE A/c on hold due to bleeding   Moderate protein caloric malnutrition due to chronic illness PT consulted Dietitian recommendations appreciated  Renal cell carcinoma with lung metastasis  Oncology recommendations appreciated  Holding Votrient   Leukocytosis Improved  Thrombocytosis Probably reactive. Monitor  DVT prophylaxis:SCD's  Code Status:Full code Family Communication:None at bedside  Disposition Plan:Keep in SDU   Consultants:  Interventional radiology  PCCM   Procedures:  Ultrasound-guided thoracentesis 08/15/2016. Repeat thoracentesis on 08/19/2016  Antimicrobials: None    Subjective: Patient seen and examined at bedside. She denies any overnight fever, nausea, vomiting. She feels very weak with some shortness of breath.  Objective: Vitals:   08/20/16 0008 08/20/16 0354 08/20/16 0700 08/20/16 0800  BP:   (!) 106/58 124/67  Pulse:   (!) 109 (!) 111  Resp:   20 (!) 23  Temp: 97.9 F (36.6 C) 99 F (37.2 C) 98.7  F (37.1 C)   TempSrc: Oral Axillary Oral   SpO2:   97% 95%  Weight:      Height:        Intake/Output Summary (Last 24 hours) at 08/20/16 0858 Last data filed at 08/20/16 0800  Gross per  24 hour  Intake          1318.75 ml  Output              500 ml  Net           818.75 ml   Filed Weights   08/14/16 2334 08/16/16 2200 08/19/16 0400  Weight: 82.6 kg (182 lb 1.6 oz) 89 kg (196 lb 3.4 oz) 90.5 kg (199 lb 8.3 oz)    Examination:  General exam: Appears calm and comfortable  Respiratory system: Bilateral decreased breath sound at bases With some scattered crackles Cardiovascular system: S1 & S2 heard, rate controlled  Gastrointestinal system: Abdomen is nondistended, soft and nontender. Normal bowel sounds heard. Extremities: No cyanosis, clubbing, trace pedal edema   Data Reviewed: I have personally reviewed following labs and imaging studies  CBC:  Recent Labs Lab 08/16/16 0507  08/17/16 0041 08/17/16 1518 08/18/16 0306 08/19/16 0307 08/20/16 0256  WBC 11.8*  --  10.5  --  12.6* 11.1* 10.2  HGB 6.3*  < > 7.4* 8.9* 9.6* 8.9* 8.9*  HCT 21.8*  < > 22.8* 28.5* 30.3* 27.6* 28.2*  MCV 98.2  --  93.4  --  91.3 92.0 93.4  PLT 594*  --  389  --  411* 403* 512*  < > = values in this interval not displayed. Basic Metabolic Panel:  Recent Labs Lab 08/16/16 0507 08/17/16 0041 08/18/16 0306 08/19/16 0307 08/20/16 0256  NA 137 134* 134* 138 137  K 4.6 4.1 4.0 4.2 4.4  CL 107 107 108 111 111  CO2 20* 19* 19* 19* 18*  GLUCOSE 108* 214* 111* 82 87  BUN 27* 26* 20 17 13   CREATININE 2.32* 2.03* 1.57* 1.28* 1.27*  CALCIUM 7.9* 7.7* 7.8* 7.9* 8.3*  MG  --   --   --   --  1.8   GFR: Estimated Creatinine Clearance: 50.6 mL/min (A) (by C-G formula based on SCr of 1.27 mg/dL (H)). Liver Function Tests:  Recent Labs Lab 08/14/16 1635 08/19/16 1016  AST 18  --   ALT 9*  --   ALKPHOS 92  --   BILITOT 0.7  --   PROT 7.4 5.2*  ALBUMIN 2.7*  --     Recent Labs Lab 08/14/16 1635  LIPASE 24   No results for input(s): AMMONIA in the last 168 hours. Coagulation Profile:  Recent Labs Lab 08/14/16 1635 08/14/16 2336 08/15/16 0201  INR 1.98 1.96 1.83    Cardiac Enzymes: No results for input(s): CKTOTAL, CKMB, CKMBINDEX, TROPONINI in the last 168 hours. BNP (last 3 results) No results for input(s): PROBNP in the last 8760 hours. HbA1C: No results for input(s): HGBA1C in the last 72 hours. CBG:  Recent Labs Lab 08/19/16 1219 08/19/16 1806 08/19/16 2146 08/20/16 0733 08/20/16 0840  GLUCAP 183* 123* 139* 63* 91   Lipid Profile: No results for input(s): CHOL, HDL, LDLCALC, TRIG, CHOLHDL, LDLDIRECT in the last 72 hours. Thyroid Function Tests: No results for input(s): TSH, T4TOTAL, FREET4, T3FREE, THYROIDAB in the last 72 hours. Anemia Panel: No results for input(s): VITAMINB12, FOLATE, FERRITIN, TIBC, IRON, RETICCTPCT in the last 72 hours. Sepsis Labs:  Recent Labs Lab 08/14/16 1748  08/14/16 2254 08/14/16 2255 08/15/16 0201  PROCALCITON  --  1.18  --   --   LATICACIDVEN 2.24*  --  2.1* 1.2    Recent Results (from the past 240 hour(s))  Urine culture     Status: None   Collection Time: 08/14/16 10:55 PM  Result Value Ref Range Status   Specimen Description URINE, RANDOM  Final   Special Requests NONE  Final   Culture   Final    NO GROWTH Performed at [facility], [place] [address], [place], [place] [address]    Report Status 08/16/2016 FINAL  Final  Culture, body fluid-bottle     Status: None (Preliminary result)   Collection Time: 08/15/16  1:40 PM  Result Value Ref Range Status   Specimen Description PLEURAL LEFT  Final   Special Requests BOTTLES DRAWN AEROBIC AND ANAEROBIC  Final   Culture   Final    NO GROWTH 4 DAYS Performed at [facility], [place] [address] [place], [place] [address]    Report Status PENDING  Incomplete  Gram stain     Status: None   Collection Time: 08/15/16  1:40 PM  Result Value Ref Range Status   Specimen Description PLEURAL LEFT  Final   Special Requests NONE  Final   Gram Stain   Final    FEW WBC PRESENT,BOTH PMN AND MONONUCLEAR NO ORGANISMS SEEN Performed at [facility], [address], [place], [place] [address]    Report Status 08/16/2016 FINAL  Final  MRSA PCR Screening     Status: None   Collection Time: 08/16/16 10:33 PM  Result Value Ref Range Status   MRSA by PCR NEGATIVE NEGATIVE Final    Comment:        The GeneXpert MRSA Assay (FDA approved for NASAL specimens only), is one component of a comprehensive MRSA colonization surveillance program. It is not intended to diagnose MRSA infection nor to guide or monitor treatment for MRSA infections.          Radiology Studies: Dg Chest Port 1 View  Result Date: 08/19/2016 CLINICAL DATA:  Post left thoracentesis EXAM: PORTABLE CHEST 1 VIEW COMPARISON:  08/19/2016 FINDINGS: Continued large left pleural effusion, slightly decreased since prior study with slight increase in aeration of the left lung. No pneumothorax. Small right pleural effusion with right lower lobe atelectasis or infiltrate is stable. IMPRESSION: Slight decreased size of the large left pleural effusion following thoracentesis. No pneumothorax. Otherwise no change. Electronically Signed   By: Rolm Baptise M.D.   On: 08/19/2016 11:46   Dg Chest Port 1 View  Result Date: 08/19/2016 CLINICAL DATA:  Pleural effusion. EXAM: PORTABLE CHEST 1 VIEW COMPARISON:  08/18/2016.  CT 08/16/2016. FINDINGS: Heart size stable. Persistent large left pleural effusion, unchanged from prior exam. Small right pleural effusion again noted unchanged. Basilar atelectasis. Pulmonary nodules present best identified by prior CT. No pneumothorax. IMPRESSION: 1. Stable chest with large left pleural effusion and small right pleural effusion. 2. Basilar atelectasis. Pulmonary nodules present best identified by prior CT of 05/ 20/2018 . Electronically Signed   By: Marcello Moores  Register   On: 08/19/2016 07:16        Scheduled Meds: . feeding supplement  1 Container Oral TID BM  . insulin aspart  0-9 Units Subcutaneous TID WC  . insulin glargine  12  Units Subcutaneous Daily  . mouth rinse  15 mL Mouth Rinse BID  . mirtazapine  15 mg Oral  QHS  . pantoprazole  40 mg Oral QODAY  . rosuvastatin  10 mg Oral QHS  . sodium chloride flush  3 mL Intravenous Q12H   Continuous Infusions: . sodium chloride 50 mL/hr at 08/20/16 0800     LOS: 6 days        Aline August, MD [facility] Pager [phone]  If 7PM-7AM, please contact night-coverage [email or web address] Password [facility] 08/20/2016, 8:58 AM

## 2016-08-20 NOTE — Progress Notes (Signed)
PT Cancellation Note  Patient Details Name: Laura Neal MRN: [id number] DOB: [birth year]   Cancelled Treatment:    Reason Eval/Treat Not Completed: Medical issues which prohibited therapy (patient SOB , sats 94% on RA., states that she cannot  Get up at this time.  )   Claretha Cooper 08/20/2016, 5:10 PM Tresa Endo PT [phone]

## 2016-08-20 NOTE — Progress Notes (Signed)
[id number] Rosana Hoes, BSN, RN3,CCM/[phone]: Chart reviewed for updates and status. CM following for discharge needs. Cm following for progression of hospital stay:

## 2016-08-20 NOTE — Progress Notes (Signed)
Patient's pleural fluid pending.  Await pleural fluid for repeat cytology (note atypical cells in prior pleural fluid). PCCM will follow up once fluid returned.    Noe Gens, NP-C  Pulmonary & Critical Care Pgr: [phone] or if no answer [phone] 08/20/2016, 8:49 AM   Rush Farmer, M.D. [facility] Pulmonary/Critical Care Medicine. Pager: [phone]. After hours pager: [phone].

## 2016-08-20 NOTE — Progress Notes (Signed)
Inpatient Diabetes Program Recommendations  AACE/ADA: New Consensus Statement on Inpatient Glycemic Control (2015)  Target Ranges:  Prepandial:   less than 140 mg/dL      Peak postprandial:   less than 180 mg/dL (1-2 hours)      Critically ill patients:  140 - 180 mg/dL   Results for Laura Neal, Laura Neal (MRN [id number]) as of 08/20/2016 09:08  Ref. Range 08/20/2016 07:33  Glucose-Capillary Latest Ref Range: 65 - 99 mg/dL 63 (L)    Home DM Meds: Lantus 30 units QHS                             Byetta 10 mcg BID  Current Insulin Orders: Lantus 12 units daily                                       Novolog Sensitive Correction Scale/ SSI (0-9 units) TID AC      MD- Note patient with Hypoglycemia again this AM.  Please consider reducing Lantus further to 10 units daily    --Will follow patient during hospitalization--  Wyn Quaker RN, MSN, CDE Diabetes Coordinator Inpatient Glycemic Control Team Team Pager: [phone] (8a-5p)

## 2016-08-20 NOTE — Progress Notes (Signed)
Name: Laura Neal MRN: [id number] DOB: May 20, 1950    ADMISSION DATE:  08/14/2016 CONSULTATION DATE:  5/20  REFERRING MD :  Quincy Simmonds   CHIEF COMPLAINT: recurrent pleural effusion with acute blood loss   BRIEF SUMMARY:   66 year old female w/ h/o RCC (on pazopanib) w/ known mets to left lung and adrenals (had stable disease as of March 2018), prior DVT was on NOAC. Admitted 5/18 w/ cc: ~ 1 week h/o N/V and abd discomfort. In ER found to be dehydrated, renal fxn had decline and found to have large to mod sized left effusion. Her NOAC was held. Placed on heparin gtt and then she underwent left thoracentesis on 5/19. 2 liters of bloody fluid was evacuated fluid thus far exudate & cytology remains pending. On 5/20 am hgb had drifted from 8.7 to 6.3 BP as low as 80s. Imaging by CXR and CT suggesting either re-occurrence vs reaccumulation of left effusion. PCCM asked to evaluate    SUBJECTIVE:    No events overnight, transferred out of the ICU  VITAL SIGNS: Temp:  [97.8 F (36.6 C)-99 F (37.2 C)] 97.8 F (36.6 C) (05/24 1349) Pulse Rate:  [73-118] 118 (05/24 1349) Resp:  [20-27] 21 (05/24 1349) BP: (103-134)/(54-67) 115/61 (05/24 1349) SpO2:  [94 %-99 %] 94 % (05/24 1349)  PHYSICAL EXAMINATION: General:  Chronically ill appearing female, not quite as labored breathing as yesterday HEENT: MM pink/moist, [place]/AT, PERRL and EOM-I Neuro: Awake and interactive, moving all ext to command CV: s1s2 rrr, no m/r/g PULM: even/non-labored, lungs bilaterally  LO:VFIE, non-tender, bsx4 active  Extremities: warm/dry, - edema  Skin: no rashes or lesions    Recent Labs Lab 08/18/16 0306 08/19/16 0307 08/20/16 0256  NA 134* 138 137  K 4.0 4.2 4.4  CL 108 111 111  CO2 19* 19* 18*  BUN 20 17 13   CREATININE 1.57* 1.28* 1.27*  GLUCOSE 111* 82 87    Recent Labs Lab 08/18/16 0306 08/19/16 0307 08/20/16 0256  HGB 9.6* 8.9* 8.9*  HCT 30.3* 27.6* 28.2*  WBC 12.6* 11.1* 10.2  PLT 411* 403*  512*   Dg Chest Port 1 View  Result Date: 08/19/2016 CLINICAL DATA:  Post left thoracentesis EXAM: PORTABLE CHEST 1 VIEW COMPARISON:  08/19/2016 FINDINGS: Continued large left pleural effusion, slightly decreased since prior study with slight increase in aeration of the left lung. No pneumothorax. Small right pleural effusion with right lower lobe atelectasis or infiltrate is stable. IMPRESSION: Slight decreased size of the large left pleural effusion following thoracentesis. No pneumothorax. Otherwise no change. Electronically Signed   By: Rolm Baptise M.D.   On: 08/19/2016 11:46   Dg Chest Port 1 View  Result Date: 08/19/2016 CLINICAL DATA:  Pleural effusion. EXAM: PORTABLE CHEST 1 VIEW COMPARISON:  08/18/2016.  CT 08/16/2016. FINDINGS: Heart size stable. Persistent large left pleural effusion, unchanged from prior exam. Small right pleural effusion again noted unchanged. Basilar atelectasis. Pulmonary nodules present best identified by prior CT. No pneumothorax. IMPRESSION: 1. Stable chest with large left pleural effusion and small right pleural effusion. 2. Basilar atelectasis. Pulmonary nodules present best identified by prior CT of 05/ 20/2018 . Electronically Signed   By: Marcello Moores  Register   On: 08/19/2016 07:16    SIGNIFICANT EVENTS  5/18  Admitted 5/19  Left thoracentesis 2 liters. Exudate. Red/turbid.  5/21  PRBC per primary   STUDIES:  CT chest 5/20 >> Moderately large  residual/recurrent hemorrhagic left effusion. Progression of bilateral pulmonary nodules and  mediastinal adenopathy. Small pericardial and right pleural effusions. New peripancreatic inflammatory/edematous changes suggesting pancreatitis.  Aortic and coronary atherosclerosis.  Left renal and left adrenal masses again noted. LE Duplex 5/21 >> negative for DVT  CULTURES / CYTOLOGY: L Pleural Fluid GS 5/19 >> negative  UC 5/18 >> negative  L Pleural Fluid Culture 5/19 >>  Cytology L Pleural 5/19 >> rare atypical cells,  suspicious for malignancy Cytology L Pleural 5/23 >> no malignant cells identified, acute inflammation  Noe Gens, NP-C [facility] Pulmonary & Critical Care Pgr: [phone] or if no answer [phone]  I reviewed CXR myself, pleural effusion noted  ASSESSMENT / PLAN:  Recurrent Left Exudative Pleural Effusion - bloody appearing fluid, DDx malignant effusion vs iatrogenic hemothorax.  Hypodense and looks like could be some early loculation. Given description of effusion drained; rapidly progressive malignant effusion high on ddx, but also consider hemothorax given recent thora and having been on systemic anticoagulation.  Discussed with PCCM-NP. P: Monitor CXR intermittently for increase in size of effusion  CVTS to evaluate, recommend calling consult for ?pleurex catheter placement vs pleural biopsy vs VATS given loculation  Hypovolemic vs hemorrhagic shock. Now resolved w/ IVFs and blood.  P: Tele monitoring Hemodynamic monitoring  Acute on chronic anemia - could also be component of hemodilution and acute blood loss with recent anticoagulation and bloody pleural fluid P: Hold systemic anticoagulation  SCDs for DVT prophylaxis Trend CBC  Renal Cell Carcinoma w/ mets to L lung and Adrenal Glands - imaging as of 3/21 showed stable disease. Now w/ mod/large left effusion. Worrisome for malignant effusion P: Hold votrient  Recent DVT/PE Feb 2018 - repeat LE duplex negative this admit  P: Hold anticoagulation for ?VATS No role for IVC If anticoagulation needed from an ONC standpoint, she likely can go back once procedure needs sorted out  Acute on chronic renal failure (stage III CKD) P: Per primary SVC   DM type II P: Per primary SVC   Family -   Sister and patient updated bedside.  Patient seen and examined, agree with above note.  I dictated the care and orders written for this patient under my direction.  Rush Farmer, MD [phone]  08/20/2016, 2:38 PM

## 2016-08-20 NOTE — Progress Notes (Signed)
Report called to receiving nurse. VSS.

## 2016-08-20 NOTE — Progress Notes (Signed)
IP PROGRESS NOTE  Subjective:   Events in the last few days noted. No major complaints at this time. She continues to have issues with dyspnea on minimal exertion. Thoracentesis was repeated on 08/19/2016. Cytology from 08/15/2016 suggestive of malignant cells.  Objective:  Vital signs in last 24 hours: Temp:  [97.8 F (36.6 C)-99 F (37.2 C)] 98.7 F (37.1 C) (05/24 0700) Pulse Rate:  [100-111] 109 (05/24 0900) Resp:  [19-27] 22 (05/24 0900) BP: (95-125)/(51-67) 125/59 (05/24 0900) SpO2:  [95 %-100 %] 96 % (05/24 0900) Weight change:  Last BM Date: 08/17/16  Intake/Output from previous day: 05/23 0701 - 05/24 0700 In: 1418.8 [I.V.:1418.8] Out: 500 [Urine:500] Chronically ill-appearing woman. Comfortable today without major distress. Mouth: mucous membranes moist, pharynx normal without lesions Resp: clear to auscultation bilaterally decreased breath sounds at the bases. Cardio: regular rate and rhythm, S1, S2 normal, no murmur, click, rub or gallop GI: soft, non-tender; bowel sounds normal; no masses,  no organomegaly Extremities: extremities normal, atraumatic, no cyanosis or edema  Portacath/PICC-without erythema  Lab Results:  Recent Labs  08/19/16 0307 08/20/16 0256  WBC 11.1* 10.2  HGB 8.9* 8.9*  HCT 27.6* 28.2*  PLT 403* 512*    BMET  Recent Labs  08/19/16 0307 08/20/16 0256  NA 138 137  K 4.2 4.4  CL 111 111  CO2 19* 18*  GLUCOSE 82 87  BUN 17 13  CREATININE 1.28* 1.27*  CALCIUM 7.9* 8.3*       Medications: I have reviewed the patient's current medications.  Assessment/Plan:  66 year old woman with the following issues:  1. Renal cell carcinoma with metastatic disease to the lung. She has been on Votrient but appears to have progressed at this time based on recent imaging studies.   Votrient will be discontinued and different salvage therapy will be used once her acute illness resolves.  2. Recurrent pleural effusion: Cytology from May  19 suggestive of malignancy. Cytology from May 23 is pending. This malignant cells are identified at this time, I'm in favor of Pleurx catheter for long-term pleural effusion control.  3. VTE: Full dose anticoagulation is on hold because of possible bleeding.  4. Acute renal failure: Kidney function returned to baseline..  5. Prognosis: She has an incurable malignancy but her performance status prior to her hospitalization is reasonable to consider aggressive therapy at this time.     LOS: 6 days   [facility] 08/20/2016, 12:33 PM

## 2016-08-21 ENCOUNTER — Inpatient Hospital Stay (HOSPITAL_COMMUNITY): Payer: Medicare Other

## 2016-08-21 LAB — CBC WITH DIFFERENTIAL/PLATELET
Basophils Absolute: 0 10*3/uL (ref 0.0–0.1)
Basophils Relative: 0 %
EOS ABS: 0.1 10*3/uL (ref 0.0–0.7)
EOS PCT: 1 %
HCT: 28.9 % — ABNORMAL LOW (ref 36.0–46.0)
Hemoglobin: 9 g/dL — ABNORMAL LOW (ref 12.0–15.0)
LYMPHS ABS: 3.8 10*3/uL (ref 0.7–4.0)
Lymphocytes Relative: 26 %
MCH: 29.8 pg (ref 26.0–34.0)
MCHC: 31.1 g/dL (ref 30.0–36.0)
MCV: 95.7 fL (ref 78.0–100.0)
MONO ABS: 1.1 10*3/uL — AB (ref 0.1–1.0)
MONOS PCT: 8 %
Neutro Abs: 9.6 10*3/uL — ABNORMAL HIGH (ref 1.7–7.7)
Neutrophils Relative %: 65 %
PLATELETS: 636 10*3/uL — AB (ref 150–400)
RBC: 3.02 MIL/uL — ABNORMAL LOW (ref 3.87–5.11)
RDW: 19.1 % — AB (ref 11.5–15.5)
WBC: 14.7 10*3/uL — ABNORMAL HIGH (ref 4.0–10.5)

## 2016-08-21 LAB — BASIC METABOLIC PANEL
Anion gap: 12 (ref 5–15)
BUN: 14 mg/dL (ref 6–20)
CALCIUM: 8.4 mg/dL — AB (ref 8.9–10.3)
CO2: 13 mmol/L — ABNORMAL LOW (ref 22–32)
CREATININE: 1.4 mg/dL — AB (ref 0.44–1.00)
Chloride: 112 mmol/L — ABNORMAL HIGH (ref 101–111)
GFR calc Af Amer: 44 mL/min — ABNORMAL LOW (ref >60)
GFR, EST NON AFRICAN AMERICAN: 38 mL/min — AB (ref >60)
Glucose, Bld: 83 mg/dL (ref 65–99)
Potassium: 4.7 mmol/L (ref 3.5–5.1)
SODIUM: 137 mmol/L (ref 135–145)

## 2016-08-21 LAB — GLUCOSE, CAPILLARY
Glucose-Capillary: 75 mg/dL (ref 65–99)
Glucose-Capillary: 79 mg/dL (ref 65–99)
Glucose-Capillary: 64 mg/dL — ABNORMAL LOW (ref 65–99)
Glucose-Capillary: 157 mg/dL — ABNORMAL HIGH (ref 65–99)

## 2016-08-21 LAB — MAGNESIUM: MAGNESIUM: 1.7 mg/dL (ref 1.7–2.4)

## 2016-08-21 LAB — PROTIME-INR
INR: 1.59
Prothrombin Time: 19.1 seconds — ABNORMAL HIGH (ref 11.4–15.2)

## 2016-08-21 MED ORDER — CEFAZOLIN SODIUM-DEXTROSE 2-4 GM/100ML-% IV SOLN
2.0000 g | INTRAVENOUS | Status: AC
  Filled 2016-08-21: qty 100

## 2016-08-21 NOTE — Progress Notes (Signed)
PT Cancellation Note  Patient Details Name: Laura Neal MRN: [id number] DOB: 1951-03-18   Cancelled Treatment:    Reason Eval/Treat Not Completed: Fatigue/lethargy limiting ability to participate Pt reports MD this morning told her if she didn't feel well or too weak that she doesn't "need to do it"   Pt declines to mobilize at this time due to weakness.   Jami Bogdanski,KATHrine E 08/21/2016, 1:29 PM Carmelia Bake, PT, DPT 08/21/2016 Pager: [phone]

## 2016-08-21 NOTE — Care Management Important Message (Signed)
Important Message  Patient Details  Name: Laura Neal MRN: [id number] Date of Birth: Feb 25, 1951   Medicare Important Message Given:  Yes    Kerin Salen 08/21/2016, 11:33 AMImportant Message  Patient Details  Name: Laura Neal MRN: [id number] Date of Birth: 07-23-1950   Medicare Important Message Given:  Yes    Kerin Salen 08/21/2016, 11:33 AM

## 2016-08-21 NOTE — Progress Notes (Signed)
Patient ID: Laura Neal, female   DOB: 07/21/1950, 66 y.o.   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 Patient reported symptoms to her oncologist who recommended to stop her medications and report to the emergency department. In the ED she was found to be dehydrated with creatinine of 2.9 BUN of 34. Chest x-ray was done which showed moderate to large pleural effusion worsened from prior studies and a lactic acid level of 2.24. Patient was admitted for IV hydration and further evaluation. Patient had thoracentesison 08/15/16 which yielded 2 L of bloody fluids. Subsequently Hgb dropped and effusion worsened. CCM consulted. Pleural fluid cytology was suspicious for malignant cells. She had a repeat thoracentesis done on 08/19/2016 at bedside by PCCM, which was negative for malignant cells.   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 Hemothorax ? Malignant vs Traumatic  Patient is status post ultrasound-guided thoracentesis 08/15/16 - Tap was bloody. fluid analysis was suspicious for malignant cells Effusion continues toworsen Patient had repeat thoracentesis done yesterday on 08/19/2016, which was negative  for malignant cells Patient is more short of breath today: Get stat chest x-ray Called cardiothoracic surgical consult. We will follow up with the recommendations. Follow-up from PCCM has been appreciated.   Acute kidney injuryon chronic kidney disease stage III. In view of volume depletion and poor oral appetite.  Today creatinine is 1.4 Baseline creatinine about 1.5, creatinine upon admission 2.90.  Holding HCTZ, losartan and Votrient  Continue to monitor   Acute blood loss anemia ? Hemothorax from traumatic tap, in view of anticoagulation  S/p 2 units of PRBC's  Hgb stable currently at 9 Holding off on anticoagulation and ASA  Hypotension- probably due toblood loss  Improving but still borderline  Monitor BP; hold antihypertensives for now  Type 2 diabetes mellitus -  Holding home medications Continue SSI and Lantus Monitor CBG  History of DVT/PE A/c on hold due to bleeding   Moderate protein caloric malnutrition due to chronic illness Dietitian recommendations appreciated  Renal cell carcinoma with lung metastasis  Oncology recommendations appreciated  Holding Votrient   Leukocytosis White count worse today. We will repeat labs in a.m.  Thrombocytosis Probably reactive. Monitor     DVT prophylaxis:SCD's  Code Status:Full code Family Communication:None at bedside  Disposition Plan:Unclear at this time   Consultants:  Interventional radiology  PCCM   Procedures:  Ultrasound-guided thoracentesis 08/15/2016. Repeat thoracentesis on 08/19/2016  Antimicrobials: None  Subjective: Patient seen and examined at bedside. She does not feel well. She complains of abdominal pain and nausea but no vomiting. She is also more short of breath today.  Objective: Vitals:   08/20/16 1325 08/20/16 1349 08/20/16 2121 08/21/16 4585  BP: (!) 110/56 115/61 126/77 112/83  Pulse:  (!) 118 (!) 110 (!) 114  Resp:  (!) 21 18 18   Temp:  97.8 F (36.6 C)  98.1 F (36.7 C) 98.5 F (36.9 C)  TempSrc:  Oral Oral Oral  SpO2:  94% 95% 93%  Weight:      Height:        Intake/Output Summary (Last 24 hours) at 08/21/16 1025 Last data filed at 08/20/16 1700  Gross per 24 hour  Intake              540 ml  Output                0 ml  Net              540 ml   Filed Weights   08/14/16 2334 08/16/16 2200 08/19/16 0400  Weight: 82.6 kg (182 lb 1.6 oz) 89 kg (196 lb 3.4 oz) 90.5 kg (199 lb 8.3 oz)    Examination:  General exam: Appears In mild distress secondary to nausea and shortness of breath Respiratory system: Bilateral decreased breath sound at bases with scattered crackles Cardiovascular system: S1 & S2 heard, tachycardic  Gastrointestinal system: Abdomen is nondistended, soft and with mildly tender epigastric region. Normal bowel sounds heard. Central nervous system: Alert and oriented. No focal neurological deficits. Moving extremities Extremities: No cyanosis, clubbing, trace pedal edema  Skin: No rashes, lesions or ulcers Psychiatry: Judgement and insight appear normal. Mood & affect appropriate.     Data Reviewed: I have personally reviewed following labs and imaging studies  CBC:  Recent Labs Lab 08/17/16 0041 08/17/16 1518 08/18/16 0306 08/19/16 0307 08/20/16 0256 08/21/16 0607  WBC 10.5  --  12.6* 11.1* 10.2 14.7*  NEUTROABS  --   --   --   --   --  9.6*  HGB 7.4* 8.9* 9.6* 8.9* 8.9* 9.0*  HCT 22.8* 28.5* 30.3* 27.6* 28.2* 28.9*  MCV 93.4  --  91.3 92.0 93.4 95.7  PLT 389  --  411* 403* 512* 737*   Basic Metabolic Panel:  Recent Labs Lab 08/17/16 0041 08/18/16 0306 08/19/16 0307 08/20/16 0256 08/21/16 0607  NA 134* 134* 138 137 137  K 4.1 4.0 4.2 4.4 4.7  CL 107 108 111 111 112*  CO2 19* 19* 19* 18* 13*  GLUCOSE 214* 111* 82 87 83  BUN 26* 20 17 13 14   CREATININE 2.03* 1.57* 1.28* 1.27* 1.40*  CALCIUM 7.7* 7.8* 7.9* 8.3* 8.4*  MG  --   --   --  1.8 1.7   GFR: Estimated Creatinine Clearance: 45.9  mL/min (A) (by C-G formula based on SCr of 1.4 mg/dL (H)). Liver Function Tests:  Recent Labs Lab 08/14/16 1635 08/19/16 1016  AST 18  --   ALT 9*  --   ALKPHOS 92  --   BILITOT 0.7  --   PROT 7.4 5.2*  ALBUMIN 2.7*  --     Recent Labs Lab 08/14/16 1635  LIPASE 24   No results for input(s): AMMONIA in the last 168 hours. Coagulation Profile:  Recent Labs Lab 08/14/16 1635 08/14/16 2336 08/15/16 0201  INR 1.98 1.96 1.83   Cardiac Enzymes: No results for input(s): CKTOTAL, CKMB, CKMBINDEX, TROPONINI in the last 168 hours. BNP (last 3 results) No results for input(s): PROBNP in the last 8760 hours. HbA1C: No results for input(s): HGBA1C in the last 72 hours. CBG:  Recent Labs Lab 08/20/16 0840 08/20/16 1302 08/20/16  1630 08/20/16 2119 08/21/16 0747  GLUCAP 91 101* 101* 72 75   Lipid Profile: No results for input(s): CHOL, HDL, LDLCALC, TRIG, CHOLHDL, LDLDIRECT in the last 72 hours. Thyroid Function Tests: No results for input(s): TSH, T4TOTAL, FREET4, T3FREE, THYROIDAB in the last 72 hours. Anemia Panel: No results for input(s): VITAMINB12, FOLATE, FERRITIN, TIBC, IRON, RETICCTPCT in the last 72 hours. Sepsis Labs:  Recent Labs Lab 08/14/16 1748 08/14/16 2254 08/14/16 2255 08/15/16 0201  PROCALCITON  --  1.18  --   --   LATICACIDVEN 2.24*  --  2.1* 1.2    Recent Results (from the past 240 hour(s))  Urine culture     Status: None   Collection Time: 08/14/16 10:55 PM  Result Value Ref Range Status   Specimen Description URINE, RANDOM  Final   Special Requests NONE  Final   Culture   Final    NO GROWTH Performed at [facility], [place] [address] [place], [place] [address]    Report Status 08/16/2016 FINAL  Final  Culture, body fluid-bottle     Status: None   Collection Time: 08/15/16  1:40 PM  Result Value Ref Range Status   Specimen Description PLEURAL LEFT  Final   Special Requests BOTTLES DRAWN AEROBIC AND ANAEROBIC  Final   Culture    Final    NO GROWTH 5 DAYS Performed at [facility], [place] [address] [place], [place] [address]    Report Status 08/20/2016 FINAL  Final  Gram stain     Status: None   Collection Time: 08/15/16  1:40 PM  Result Value Ref Range Status   Specimen Description PLEURAL LEFT  Final   Special Requests NONE  Final   Gram Stain   Final    FEW WBC PRESENT,BOTH PMN AND MONONUCLEAR NO ORGANISMS SEEN Performed at [facility], [place] [address] [place],  [address]    Report Status 08/16/2016 FINAL  Final  MRSA PCR Screening     Status: None   Collection Time: 08/16/16 10:33 PM  Result Value Ref Range Status   MRSA by PCR NEGATIVE NEGATIVE Final    Comment:        The GeneXpert MRSA Assay (FDA approved for NASAL specimens only), is one component of a comprehensive MRSA colonization surveillance program. It is not intended to diagnose MRSA infection nor to guide or monitor treatment for MRSA infections.          Radiology Studies: Dg Chest Port 1 View  Result Date: 08/19/2016 CLINICAL DATA:  Post left thoracentesis EXAM: PORTABLE CHEST 1 VIEW COMPARISON:  08/19/2016 FINDINGS: Continued large left pleural effusion, slightly decreased since prior study with slight increase in aeration of the left lung. No pneumothorax. Small right pleural effusion with right lower lobe atelectasis or infiltrate is stable. IMPRESSION: Slight decreased size of the large left pleural effusion following thoracentesis. No pneumothorax. Otherwise no change. Electronically Signed   By: Rolm Baptise M.D.   On: 08/19/2016 11:46        Scheduled Meds: . feeding supplement  1 Container Oral TID BM  . insulin aspart  0-9 Units Subcutaneous TID WC  . insulin glargine  10 Units Subcutaneous Daily  . mouth rinse  15 mL Mouth Rinse BID  . mirtazapine  15 mg Oral QHS  . pantoprazole  40 mg Oral QODAY  . rosuvastatin  10 mg Oral QHS  . sodium chloride flush  3 mL Intravenous Q12H   Continuous  Infusions:   LOS: 7 days        Aline August, MD [facility] Pager [phone]  If 7PM-7AM, please contact night-coverage [email or web address] Password [facility] 08/21/2016, 10:25 AM

## 2016-08-21 NOTE — Progress Notes (Signed)
PT Cancellation Note  Patient Details Name: Laura Neal MRN: [id number] DOB: October 31, 1950   Cancelled Treatment:    Reason Eval/Treat Not Completed: Fatigue/lethargy limiting ability to participate (pt just received bath, reports she will be SOB if she get OOB at this time, requests PT to check back)   [name],KATHrine E 08/21/2016, 10:11 AM Carmelia Bake, PT, DPT 08/21/2016 Pager: [phone]

## 2016-08-21 NOTE — Progress Notes (Signed)
Referring Physician(s): Alekh,K  Supervising Physician: Daryll Brod  Patient Status:  [facility] - In-pt  Chief Complaint: Shortness of breath, recurrent left pleural effusion, metastatic renal cell carcinoma   Subjective: Patient familiar to IR service from prior left renal mass biopsy in April 2017 as well as left thoracentesis on 08/15/16 yielding 2 L of bloody fluid. Cytology of the fluid was suspicious for malignancy. Patient also underwent diagnostic left thoracentesis by critical care medicine on 5/23 yielding 200 mL of bloody fluid with negative cytology. She has a known history of metastatic renal cell carcinoma and was recently admitted with nausea, vomiting, abdominal pain, dehydration, weakness, acute on chronic renal failure as well as acute on chronic anemia. She has prior history of DVT and PE in February of this year with resolution of DVT on latest Doppler study. Additional medical history as below. Request now received for repeat left thoracentesis followed by left Pleurx catheter placement next week. Past Medical History:  Diagnosis Date  . Allergy   . Anemia   . Arthritis    "it was probably in my right hip" (05/26/2016)  . Cancer of right kidney ([place]) 2000   on PO chemotherapy /notes 05/26/2016  . GERD (gastroesophageal reflux disease)   . History of blood transfusion 03/2016   "w/hip replacement"  . History of stomach ulcers 1970s  . Hyperlipidemia   . Hypertension   . Pneumonia    "it's been awhile" (05/26/2016)  . Pulmonary embolism ([place]) 05/26/2016  . Seasonal allergies   . Seizures ([place])    "when I was young" (05/26/2016), none since childhood 07-08-16  . Type II diabetes mellitus ([place])   . Ulcer    Past Surgical History:  Procedure Laterality Date  . ABDOMINAL HYSTERECTOMY  1987   "left an ovary"  . BACK SURGERY    . COLONOSCOPY    . ECTOPIC PREGNANCY SURGERY    . JOINT REPLACEMENT    . [place] SURGERY  1990s  . NEPHRECTOMY Right 2000   for  cancer  . TOTAL HIP ARTHROPLASTY Right 04/22/2016   Procedure: RIGHT TOTAL HIP ARTHROPLASTY ANTERIOR APPROACH;  Surgeon: Gaynelle Arabian, MD;  Location: [facility] ORS;  Service: Orthopedics;  Laterality: Right;  . UPPER GASTROINTESTINAL ENDOSCOPY        Allergies: Codeine  Medications: Prior to Admission medications   Medication Sig Start Date End Date Taking? Authorizing Provider  acetaminophen (TYLENOL) 325 MG tablet Take 2 tablets (650 mg total) by mouth every 6 (six) hours as needed for mild pain (or Fever >/= 101). 04/27/16  Yes Perkins, Alexzandrew L, PA-C  apixaban (ELIQUIS) 5 MG TABS tablet Take 1 tablet (5 mg total) by mouth 2 (two) times daily. 06/03/16  Yes Everrett Coombe, MD  aspirin EC 81 MG tablet Take 81 mg by mouth daily.   Yes [provider]  BYETTA 10 MCG PEN 10 MCG/0.04ML SOPN injection Inject 10 mg into the skin 2 (two) times daily. 05/13/15  Yes [provider]  fluticasone (FLONASE) 50 MCG/ACT nasal spray Place 2 sprays into the nose daily. Patient taking differently: Place 2 sprays into the nose daily as needed for allergies.  11/26/12  Yes Robyn Haber, MD  furosemide (LASIX) 20 MG tablet Take 20 mg by mouth daily as needed for fluid or edema.  10/23/14  Yes [provider]  hydrochlorothiazide (MICROZIDE) 12.5 MG capsule Take 1 capsule (12.5 mg total) by mouth daily. 05/28/16  Yes Everrett Coombe, MD  insulin glargine (LANTUS) 100  UNIT/ML injection Inject 30 Units into the skin at bedtime.    Yes [provider]  loperamide (IMODIUM) 2 MG capsule Take 2 mg by mouth as needed for diarrhea or loose stools (side effects of Votrient.).   Yes [provider]  losartan (COZAAR) 50 MG tablet Take 1 tablet (50 mg total) by mouth daily. 05/28/16  Yes Everrett Coombe, MD  methocarbamol (ROBAXIN) 500 MG tablet Take 1 tablet (500 mg total) by mouth every 6 (six) hours as needed for muscle spasms. 04/27/16  Yes Perkins, Alexzandrew L, PA-C  mirtazapine  (REMERON) 15 MG tablet Take 1 tablet (15 mg total) by mouth at bedtime. 07/28/16  Yes Wyatt Portela, MD  pantoprazole (PROTONIX) 40 MG tablet Take 1 tablet (40 mg total) by mouth daily. Patient taking differently: Take 40 mg by mouth every other day.  06/16/16  Yes Esterwood, Amy S, PA-C  pazopanib (VOTRIENT) 200 MG tablet Take 3 tablets (600 mg total) by mouth daily. Take on an empty stomach. 08/14/16  Yes Wyatt Portela, MD  prochlorperazine (COMPAZINE) 10 MG tablet Take 1 tablet (10 mg total) by mouth every 6 (six) hours as needed for nausea or vomiting. 08/14/16  Yes Wyatt Portela, MD  rosuvastatin (CRESTOR) 10 MG tablet Take 10 mg by mouth at bedtime.  11/18/15  Yes [provider]  traMADol (ULTRAM) 50 MG tablet Take 1-2 tablets (50-100 mg total) by mouth every 6 (six) hours as needed (mild pain). 04/27/16  Yes Perkins, Alexzandrew L, PA-C     Vital Signs: BP 112/83 (BP Location: Right Arm)   Pulse (!) 114   Temp 98.5 F (36.9 C) (Oral)   Resp 18   Ht 5\' 8"  (1.727 m)   Wt 199 lb 8.3 oz (90.5 kg)   SpO2 93%   BMI 30.34 kg/m   Physical Exam patient awake but appears very weak. Also dyspneic. Chest with diminished breath sounds on left, slightly diminished right base. Heart with slightly tachycardic but regular rhythm. Abdomen soft, positive bowel sounds, nontender. Lower extremities- 2+ edema bilaterally.  Imaging: Dg Chest Port 1 View  Result Date: 08/21/2016 CLINICAL DATA:  Dyspnea EXAM: PORTABLE CHEST 1 VIEW COMPARISON:  08/19/2016 chest radiograph. FINDINGS: Stable cardiomediastinal silhouette with mild cardiomegaly and aortic atherosclerosis. No pneumothorax. Small right pleural effusion appears slightly increased. Large left pleural effusion appears stable. Stable near complete opacification of the left lung with minimal preserved aeration in the left upper lobe. Worsening patchy right lung base opacity. IMPRESSION: 1. Stable large left pleural effusion. Increased small  right pleural effusion . 2. Stable near complete left lung opacification with minimal preserved aeration in the left upper lobe. Worsening patchy right lung base opacity. Findings could represent atelectasis, with pneumonia or aspiration not excluded. 3. Stable cardiomegaly. 4. Aortic atherosclerosis. Electronically Signed   By: Ilona Sorrel M.D.   On: 08/21/2016 11:53   Dg Chest Port 1 View  Result Date: 08/19/2016 CLINICAL DATA:  Post left thoracentesis EXAM: PORTABLE CHEST 1 VIEW COMPARISON:  08/19/2016 FINDINGS: Continued large left pleural effusion, slightly decreased since prior study with slight increase in aeration of the left lung. No pneumothorax. Small right pleural effusion with right lower lobe atelectasis or infiltrate is stable. IMPRESSION: Slight decreased size of the large left pleural effusion following thoracentesis. No pneumothorax. Otherwise no change. Electronically Signed   By: Rolm Baptise M.D.   On: 08/19/2016 11:46   Dg Chest Port 1 View  Result Date: 08/19/2016 CLINICAL DATA:  Pleural effusion. EXAM: PORTABLE CHEST 1 VIEW COMPARISON:  08/18/2016.  CT 08/16/2016. FINDINGS: Heart size stable. Persistent large left pleural effusion, unchanged from prior exam. Small right pleural effusion again noted unchanged. Basilar atelectasis. Pulmonary nodules present best identified by prior CT. No pneumothorax. IMPRESSION: 1. Stable chest with large left pleural effusion and small right pleural effusion. 2. Basilar atelectasis. Pulmonary nodules present best identified by prior CT of 05/ 20/2018 . Electronically Signed   By: Marcello Moores  Register   On: 08/19/2016 07:16   Dg Chest Port 1 View  Result Date: 08/18/2016 CLINICAL DATA:  Pleural effusion. EXAM: PORTABLE CHEST 1 VIEW COMPARISON:  08/17/2016.  CT 08/16/2016. FINDINGS: Heart size stable Persistent large left pleural effusion, unchanged from prior exam. Small right pleural effusion again noted without interim change . Bibasilar  atelectasis. Pulmonary nodules best identified by prior CT of 08/16/2016 . No pneumothorax. IMPRESSION: 1. Persistent large left pleural effusion. Small right pleural effusion. 2. Bibasilar atelectasis. Pulmonary nodules best identified by prior CT of 08/16/2016. Electronically Signed   By: Marcello Moores  Register   On: 08/18/2016 06:56    Labs:  CBC:  Recent Labs  08/18/16 0306 08/19/16 0307 08/20/16 0256 08/21/16 0607  WBC 12.6* 11.1* 10.2 14.7*  HGB 9.6* 8.9* 8.9* 9.0*  HCT 30.3* 27.6* 28.2* 28.9*  PLT 411* 403* 512* 636*    COAGS:  Recent Labs  04/17/16 1408 08/14/16 1635 08/14/16 2336 08/15/16 0201 08/15/16 1018 08/15/16 2230  INR 0.95 1.98 1.96 1.83  --   --   APTT 33  --  50*  --  145* 142*    BMP:  Recent Labs  08/18/16 0306 08/19/16 0307 08/20/16 0256 08/21/16 0607  NA 134* 138 137 137  K 4.0 4.2 4.4 4.7  CL 108 111 111 112*  CO2 19* 19* 18* 13*  GLUCOSE 111* 82 87 83  BUN 20 17 13 14   CALCIUM 7.8* 7.9* 8.3* 8.4*  CREATININE 1.57* 1.28* 1.27* 1.40*  GFRNONAA 33* 42* 43* 38*  GFRAA 38* 49* 49* 44*    LIVER FUNCTION TESTS:  Recent Labs  06/24/16 1240 07/27/16 1308 07/28/16 1244 08/14/16 1635 08/19/16 1016  BILITOT 0.60 0.4 0.54 0.7  --   AST 17 11 10 18   --   ALT 13 5 <6 9*  --   ALKPHOS 107 122* 111 92  --   PROT 7.3 6.4 6.6 7.4 5.2*  ALBUMIN 2.9* 3.2* 2.4* 2.7*  --     Assessment and Plan: Patient with history of metastatic renal cell carcinoma with recurrent, symptomatic, most likely malignant left pleural effusion; last thoracentesis by IR done 08/15/16 yielded 2 L of bloody fluid. Patient remains dyspneic . Chest x-ray today shows stable large left pleural effusion with small right effusion. Request now received for left thoracentesis today followed by placement of a left Pleurx catheter next week- likely on 5/29. Both oncology and TCTS services recommend Pleurx catheter placement. Imaging studies have been reviewed by Dr. Pascal Lux.  Details/risks of procedures, including but not limited to, internal bleeding, infection, pneumothorax, injury to adjacent structures, discussed with patient with her understanding and consent.   Electronically Signed: D. Rowe Robert, PA-C 08/21/2016, 12:42 PM   I spent a total of 20 minutes at the the patient's bedside AND on the patient's hospital floor or unit, greater than 50% of which was counseling/coordinating care for left Pleurx catheter placement    Patient ID: Laura Neal, female   DOB: [birth year], 66 y.o.   MRN:  [id number]  

## 2016-08-21 NOTE — Procedures (Addendum)
Ultrasound-guided therapeutic left thoracentesis performed yielding 1.5 liters of bloody fluid. No immediate complications. Follow-up chest x-ray pending.Only the above amount of fluid was removed today secondary to persistent cough, chest discomfort.

## 2016-08-22 LAB — COMPREHENSIVE METABOLIC PANEL
ALT: 11 U/L — AB (ref 14–54)
AST: 22 U/L (ref 15–41)
Albumin: 1.6 g/dL — ABNORMAL LOW (ref 3.5–5.0)
Alkaline Phosphatase: 94 U/L (ref 38–126)
Anion gap: 6 (ref 5–15)
BUN: 16 mg/dL (ref 6–20)
CHLORIDE: 113 mmol/L — AB (ref 101–111)
CO2: 20 mmol/L — AB (ref 22–32)
CREATININE: 1.44 mg/dL — AB (ref 0.44–1.00)
Calcium: 8.2 mg/dL — ABNORMAL LOW (ref 8.9–10.3)
GFR calc Af Amer: 42 mL/min — ABNORMAL LOW (ref >60)
GFR, EST NON AFRICAN AMERICAN: 37 mL/min — AB (ref >60)
Glucose, Bld: 104 mg/dL — ABNORMAL HIGH (ref 65–99)
POTASSIUM: 4.7 mmol/L (ref 3.5–5.1)
SODIUM: 139 mmol/L (ref 135–145)
Total Bilirubin: 0.5 mg/dL (ref 0.3–1.2)
Total Protein: 5.4 g/dL — ABNORMAL LOW (ref 6.5–8.1)

## 2016-08-22 LAB — CBC WITH DIFFERENTIAL/PLATELET
BASOS ABS: 0 10*3/uL (ref 0.0–0.1)
BASOS PCT: 0 %
EOS ABS: 0.2 10*3/uL (ref 0.0–0.7)
Eosinophils Relative: 2 %
HCT: 26.1 % — ABNORMAL LOW (ref 36.0–46.0)
Hemoglobin: 8.1 g/dL — ABNORMAL LOW (ref 12.0–15.0)
Lymphocytes Relative: 19 %
Lymphs Abs: 2 10*3/uL (ref 0.7–4.0)
MCH: 29.1 pg (ref 26.0–34.0)
MCHC: 31 g/dL (ref 30.0–36.0)
MCV: 93.9 fL (ref 78.0–100.0)
MONO ABS: 0.9 10*3/uL (ref 0.1–1.0)
Monocytes Relative: 9 %
Neutro Abs: 7.2 10*3/uL (ref 1.7–7.7)
Neutrophils Relative %: 70 %
Platelets: 475 10*3/uL — ABNORMAL HIGH (ref 150–400)
RBC: 2.78 MIL/uL — AB (ref 3.87–5.11)
RDW: 18.5 % — AB (ref 11.5–15.5)
WBC: 10.3 10*3/uL (ref 4.0–10.5)

## 2016-08-22 LAB — GLUCOSE, CAPILLARY
GLUCOSE-CAPILLARY: 101 mg/dL — AB (ref 65–99)
GLUCOSE-CAPILLARY: 96 mg/dL (ref 65–99)
Glucose-Capillary: 142 mg/dL — ABNORMAL HIGH (ref 65–99)
Glucose-Capillary: 106 mg/dL — ABNORMAL HIGH (ref 65–99)

## 2016-08-22 LAB — MAGNESIUM: MAGNESIUM: 1.8 mg/dL (ref 1.7–2.4)

## 2016-08-22 LAB — LIPASE, BLOOD: LIPASE: 21 U/L (ref 11–51)

## 2016-08-22 NOTE — Progress Notes (Signed)
Patient ID: Laura Neal, female   DOB: 05/17/1950, 66 y.o.   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 Patient reported symptoms to her oncologist who recommended to stop her medications and report to the emergency department. In the ED she was found to be dehydrated with creatinine of 2.9 BUN of 34. Chest x-ray was done which showed moderate to large pleural effusion worsened from prior studies and a lactic acid level of 2.24. Patient was admitted for IV hydration and further evaluation. Patient had thoracentesison 08/15/16 which yielded 2 L of bloody fluids. Subsequently Hgb dropped and effusion worsened. CCM consulted. Pleural fluid cytology was suspicious for malignant cells. She had a repeat thoracentesis done at bedside on 08/19/2016 at bedside by PCCM, which was negative for malignant cells. PCCM recommended cardiothoracic surgery evaluation for needs for possible chest tube versus Pleurx catheter versus VATS. Cardiothoracic surgery was consulted on phone, Dr. Servando Snare reviewed the images and recommended interventional radiology guided Pleurx catheter placement. Patient required repeat ultrasound-guided thoracentesis on 08/21/2016 and 1.5 L of fluid was drained. Patient would probably have IR guided Pleurx catheter placed on 08/25/2016.   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 Hemothorax ? Malignant vs Traumatic  Patient is status post ultrasound-guided thoracentesis 08/15/16 - Tap was bloody. fluid analysis was suspicious for malignant cells Effusion continues toworsen Patient had repeat bedside thoracentesis done on 08/19/2016, which was negative for malignant cells    PCCM recommended cardiothoracic surgery evaluation for needs for possible chest tube versus    Pleurx catheter versus VATS. Cardiothoracic surgery was consulted on phone, Dr. Servando Snare    reviewed the images and recommended interventional radiology guided Pleurx catheter placement. Patient required repeat ultrasound-guided thoracentesis on 08/21/2016 and 1.5 L of fluid was drained. Patient would probably have IR guided Pleurx catheter placed on 08/25/2016.   Acute kidney injuryon chronic kidney disease stage III. In view of volume depletion and poor oral appetite.  Resolved Holding HCTZ, losartan and Votrient  Continue to monitor   Acute blood loss anemia ? Hemothorax from traumatic tap, in view of anticoagulation  S/p 2 units of PRBC's  Hgb stable currently at 8.1 Holding off on anticoagulation and ASA  Hypotension- probably due toblood loss  Improved Monitor BP; hold antihypertensives for now  Type 2 diabetes mellitus -  Holding home medications Continue SSI and Lantus Monitor CBG  History of DVT/PE A/c on hold due to bleeding   Moderate protein caloric malnutrition due to chronic illness Dietitian recommendations appreciated  Renal cell carcinoma with lung metastasis  Oncology recommendations appreciated  Holding Votrient   Leukocytosis Resolved  Thrombocytosis Probably reactive. Monitor  Hypoalbuminemia Follow up dietary recommendations     DVT  prophylaxis:SCD's    Code Status:Full code Family Communication:None at bedside  Disposition Plan:Unclear at this time   Consultants:  Interventional radiology  PCCM   Procedures:  Ultrasound-guided thoracentesis 08/15/2016. Bedside thoracentesis on 08/19/2016  Repeat ultrasound-guided thoracentesis on 08/21/2016  Antimicrobials: None  Subjective: Patient seen and examined at bedside. She feels slightly better than yesterday after thoracentesis. She still feels weak. No overnight fever, vomiting.  Objective: Vitals:   08/21/16 1405 08/21/16 1414 08/21/16 2115 08/22/16 0522  BP: 126/78 122/64 (!) 102/59 117/68  Pulse:   100 (!) 107  Resp:   18 18  Temp:   97.5 F (36.4 C) 98.6 F (37 C)  TempSrc:   Oral Oral  SpO2:   96% 95%  Weight:      Height:       No intake or output data in the 24 hours ending 08/22/16 1319 Filed Weights   08/14/16 2334 08/16/16 2200 08/19/16 0400  Weight: 82.6 kg (182 lb 1.6 oz) 89 kg (196 lb 3.4 oz) 90.5 kg (199 lb 8.3 oz)    Examination:  General exam: Appears calm and comfortable  Respiratory system: Bilateral decreased breath sound at bases With scattered crackles Cardiovascular system: S1 & S2 heard, tachycardic  Gastrointestinal system: Abdomen is nondistended, soft, mildly tender epigastric region. Normal bowel sounds heard. Extremities: No cyanosis, clubbing; trace pitting pedal edema   Data Reviewed: I have personally reviewed following labs and imaging studies  CBC:  Recent Labs Lab 08/18/16 0306 08/19/16 0307 08/20/16 0256 08/21/16 0607 08/22/16 0613  WBC 12.6* 11.1* 10.2 14.7* 10.3  NEUTROABS  --   --   --  9.6* 7.2  HGB 9.6* 8.9* 8.9* 9.0* 8.1*  HCT 30.3* 27.6* 28.2* 28.9* 26.1*  MCV 91.3 92.0 93.4 95.7 93.9  PLT 411* 403* 512* 636* 774*   Basic Metabolic Panel:  Recent Labs Lab 08/18/16 0306 08/19/16 0307 08/20/16 0256 08/21/16 0607 08/22/16 0613  NA 134* 138 137 137 139  K 4.0 4.2 4.4 4.7 4.7  CL 108 111 111  112* 113*  CO2 19* 19* 18* 13* 20*  GLUCOSE 111* 82 87 83 104*  BUN 20 17 13 14 16   CREATININE 1.57* 1.28* 1.27* 1.40* 1.44*  CALCIUM 7.8* 7.9* 8.3* 8.4* 8.2*  MG  --   --  1.8 1.7 1.8   GFR: Estimated Creatinine Clearance: 44.6 mL/min (A) (by C-G formula based on SCr of 1.44 mg/dL (H)). Liver Function Tests:  Recent Labs Lab 08/19/16 1016 08/22/16 0613  AST  --  22  ALT  --  11*  ALKPHOS  --  94  BILITOT  --  0.5  PROT 5.2* 5.4*  ALBUMIN  --  1.6*    Recent Labs Lab 08/22/16 0613  LIPASE 21   No results for input(s): AMMONIA in the last 168 hours. Coagulation Profile:  Recent Labs Lab 08/21/16 1312  INR 1.59   Cardiac Enzymes: No results for input(s): CKTOTAL, CKMB, CKMBINDEX, TROPONINI in the last 168 hours. BNP (last 3 results) No results for input(s): PROBNP in the last 8760 hours. HbA1C: No results for input(s): HGBA1C in the last 72 hours. CBG:  Recent Labs Lab 08/21/16 1125 08/21/16 1638 08/21/16 2116 08/22/16 0726 08/22/16 1130  GLUCAP 79 64* 157* 101* 142*   Lipid Profile: No results for input(s): CHOL, HDL, LDLCALC, TRIG, CHOLHDL, LDLDIRECT in the last 72 hours. Thyroid Function Tests: No results for input(s): TSH, T4TOTAL, FREET4, T3FREE, THYROIDAB in the last 72 hours. Anemia Panel: No  results for input(s): VITAMINB12, FOLATE, FERRITIN, TIBC, IRON, RETICCTPCT in the last 72 hours. Sepsis Labs: No results for input(s): PROCALCITON, LATICACIDVEN in the last 168 hours.  Recent Results (from the past 240 hour(s))  Urine culture     Status: None   Collection Time: 08/14/16 10:55 PM  Result Value Ref Range Status   Specimen Description URINE, RANDOM  Final   Special Requests NONE  Final   Culture   Final    NO GROWTH Performed at [facility], [address] [place], [place] [address]    Report Status 08/16/2016 FINAL  Final  Culture, body fluid-bottle     Status: None   Collection Time: 08/15/16  1:40 PM  Result Value Ref Range  Status   Specimen Description PLEURAL LEFT  Final   Special Requests BOTTLES DRAWN AEROBIC AND ANAEROBIC  Final   Culture   Final    NO GROWTH 5 DAYS Performed at [facility], [place] [address], [place], [place] [address]    Report Status 08/20/2016 FINAL  Final  Gram stain     Status: None   Collection Time: 08/15/16  1:40 PM  Result Value Ref Range Status   Specimen Description PLEURAL LEFT  Final   Special Requests NONE  Final   Gram Stain   Final    FEW WBC PRESENT,BOTH PMN AND MONONUCLEAR NO ORGANISMS SEEN Performed at [facility], [place] [address], [place], [place] [address]    Report Status 08/16/2016 FINAL  Final  MRSA PCR Screening     Status: None   Collection Time: 08/16/16 10:33 PM  Result Value Ref Range Status   MRSA by PCR NEGATIVE NEGATIVE Final    Comment:        The GeneXpert MRSA Assay (FDA approved for NASAL specimens only), is one component of a comprehensive MRSA colonization surveillance program. It is not intended to diagnose MRSA infection nor to guide or monitor treatment for MRSA infections.          Radiology Studies: Dg Chest Port 1 View  Result Date: 08/21/2016 CLINICAL DATA:  Post thoracentesis EXAM: PORTABLE CHEST 1 VIEW COMPARISON:  08/21/2016 FINDINGS: Decreased left pleural effusion with moderate residual. Improved aeration of left upper lobe. No definitive pneumothorax. Small right pleural effusion. Persistent bibasilar left greater than right atelectasis or pneumonia. Mild cardiomegaly. Aortic atherosclerosis. IMPRESSION: 1. Decreased pleural effusion on the left, no pneumothorax is seen. Moderate residual left pleural effusion. 2. Otherwise no change in small right pleural effusion and dense left greater than right bilateral lower lung consolidations which may reflect atelectasis or pneumonia Electronically Signed   By: Donavan Foil M.D.   On: 08/21/2016 14:43   Dg Chest Port 1 View  Result Date: 08/21/2016 CLINICAL DATA:   Dyspnea EXAM: PORTABLE CHEST 1 VIEW COMPARISON:  08/19/2016 chest radiograph. FINDINGS: Stable cardiomediastinal silhouette with mild cardiomegaly and aortic atherosclerosis. No pneumothorax. Small right pleural effusion appears slightly increased. Large left pleural effusion appears stable. Stable near complete opacification of the left lung with minimal preserved aeration in the left upper lobe. Worsening patchy right lung base opacity. IMPRESSION: 1. Stable large left pleural effusion. Increased small right pleural effusion . 2. Stable near complete left lung opacification with minimal preserved aeration in the left upper lobe. Worsening patchy right lung base opacity. Findings could represent atelectasis, with pneumonia or aspiration not excluded. 3. Stable cardiomegaly. 4. Aortic atherosclerosis. Electronically Signed   By: Janina Mayo.D.  On: 08/21/2016 11:53   US Thoracentesis Asp Pleural Space W/img Guide  Result Date: 08/21/2016 INDICATION: Metastatic renal cell carcinoma, dyspnea, recurrent left pleural effusion; request made for therapeutic left thoracentesis. EXAM: ULTRASOUND GUIDED THERAPEUTIC LEFT THORACENTESIS MEDICATIONS: None. COMPLICATIONS: None immediate. PROCEDURE: An ultrasound guided thoracentesis was thoroughly discussed with the patient and questions answered. The benefits, risks, alternatives and complications were also discussed. The patient understands and wishes to proceed with the procedure. Written consent was obtained. Ultrasound was performed to localize and mark an adequate pocket of fluid in the left chest. The area was then prepped and draped in the normal sterile fashion. 1% Lidocaine was used for local anesthesia. Under ultrasound guidance a Safe-T-Centesis catheter was introduced. Thoracentesis was performed. The catheter was removed and a dressing applied. FINDINGS: A total of approximately 1.5 liters of bloody fluid was removed. Only the above amount of fluid was  removed at this time secondary to persistent patient coughing and chest discomfort. IMPRESSION: Successful ultrasound guided therapeutic left thoracentesis yielding 1.5 liters of pleural fluid. Read by: Rowe Robert, PA-C Electronically Signed   By: Sandi Mariscal M.D.   On: 08/21/2016 14:43        Scheduled Meds: . feeding supplement  1 Container Oral TID BM  . insulin aspart  0-9 Units Subcutaneous TID WC  . insulin glargine  10 Units Subcutaneous Daily  . mouth rinse  15 mL Mouth Rinse BID  . mirtazapine  15 mg Oral QHS  . pantoprazole  40 mg Oral QODAY  . rosuvastatin  10 mg Oral QHS  . sodium chloride flush  3 mL Intravenous Q12H   Continuous Infusions: . [START ON 08/25/2016]  ceFAZolin (ANCEF) IV       LOS: 8 days        Aline August, MD [facility] Pager [phone]  If 7PM-7AM, please contact night-coverage [email or web address] Password [email or web address] 08/22/2016, 1:19 PM

## 2016-08-23 LAB — GLUCOSE, CAPILLARY
GLUCOSE-CAPILLARY: 109 mg/dL — AB (ref 65–99)
Glucose-Capillary: 81 mg/dL (ref 65–99)
Glucose-Capillary: 74 mg/dL (ref 65–99)
Glucose-Capillary: 94 mg/dL (ref 65–99)
Glucose-Capillary: 120 mg/dL — ABNORMAL HIGH (ref 65–99)
Glucose-Capillary: 139 mg/dL — ABNORMAL HIGH (ref 65–99)

## 2016-08-23 LAB — BASIC METABOLIC PANEL
Anion gap: 7 (ref 5–15)
BUN: 15 mg/dL (ref 6–20)
CO2: 18 mmol/L — ABNORMAL LOW (ref 22–32)
CREATININE: 1.4 mg/dL — AB (ref 0.44–1.00)
Calcium: 8 mg/dL — ABNORMAL LOW (ref 8.9–10.3)
Chloride: 114 mmol/L — ABNORMAL HIGH (ref 101–111)
GFR calc Af Amer: 44 mL/min — ABNORMAL LOW (ref >60)
GFR, EST NON AFRICAN AMERICAN: 38 mL/min — AB (ref >60)
GLUCOSE: 79 mg/dL (ref 65–99)
POTASSIUM: 5.4 mmol/L — AB (ref 3.5–5.1)
Sodium: 139 mmol/L (ref 135–145)

## 2016-08-23 LAB — CBC WITH DIFFERENTIAL/PLATELET
Basophils Absolute: 0 10*3/uL (ref 0.0–0.1)
Basophils Relative: 0 %
EOS ABS: 0.2 10*3/uL (ref 0.0–0.7)
EOS PCT: 2 %
HCT: 25.2 % — ABNORMAL LOW (ref 36.0–46.0)
Hemoglobin: 7.9 g/dL — ABNORMAL LOW (ref 12.0–15.0)
LYMPHS PCT: 19 %
Lymphs Abs: 2.1 10*3/uL (ref 0.7–4.0)
MCH: 29.6 pg (ref 26.0–34.0)
MCHC: 31.3 g/dL (ref 30.0–36.0)
MCV: 94.4 fL (ref 78.0–100.0)
MONOS PCT: 9 %
Monocytes Absolute: 1 10*3/uL (ref 0.1–1.0)
Neutro Abs: 7.5 10*3/uL (ref 1.7–7.7)
Neutrophils Relative %: 70 %
PLATELETS: 466 10*3/uL — AB (ref 150–400)
RBC: 2.67 MIL/uL — AB (ref 3.87–5.11)
RDW: 18.8 % — AB (ref 11.5–15.5)
WBC: 10.8 10*3/uL — ABNORMAL HIGH (ref 4.0–10.5)

## 2016-08-23 LAB — MAGNESIUM: Magnesium: 1.7 mg/dL (ref 1.7–2.4)

## 2016-08-23 NOTE — Progress Notes (Signed)
Patient ID: Laura Neal, female   DOB: 1950-08-29, 66 y.o.   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 Patient reported symptoms to her oncologist who recommended to stop her medications and report to the emergency department. In the ED she was found to be dehydrated with creatinine of 2.9 BUN of 34. Chest x-ray was done which showed moderate to large pleural effusion worsened from prior studies and a lactic acid level of 2.24. Patient was admitted for IV hydration and further evaluation. Patient had thoracentesison 08/15/16 which yielded 2 L of bloody fluids. Subsequently Hgb dropped and effusion worsened. CCM consulted. Pleural fluid cytology was suspicious for malignant cells. She had a repeat thoracentesis done at bedside on 05/23/2018at bedside by PCCM, which was negative for malignant cells. PCCM recommended cardiothoracic surgery evaluation for needs for possible chest tube versus Pleurx catheter versus VATS. Cardiothoracic surgery was consulted on phone, Dr. Servando Snare reviewed the images and recommended interventional radiology guided Pleurx catheter placement. Patient required repeat ultrasound-guided thoracentesis on 08/21/2016 and 1.5 L of fluid was drained. Patient would probably have IR guided Pleurx catheter placed on 08/25/2016.  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 Hemothorax ? Malignant vs Traumatic  Patient is status post ultrasound-guided thoracentesis 08/15/16 - Tap was bloody. fluid analysis was suspicious for malignant cells Effusion continues toworsen Patient had repeat bedside thoracentesis done on 08/19/2016, which was negative for malignant cells    PCCM recommended cardiothoracic surgery evaluation for needs for possible chest tube versus    Pleurx catheter versus VATS. Cardiothoracic surgery was consulted on phone, Dr. Servando Snare    reviewed the images and recommended interventional radiology guided Pleurx catheter placement. Patient required repeat ultrasound-guided thoracentesis on 08/21/2016 and 1.5 L of fluid was drained. Patient would probably have IR guided Pleurx catheter placed on 08/25/2016.  Acute kidney injuryon chronic kidney disease stage III. In view of volume depletion and poor oral appetite.  Resolved Holding HCTZ, losartan and Votrient  Continue to monitor   Acute blood loss anemia ? 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 Monitor  Hypoalbuminemia Follow up dietary recommendations  Hyperkalemia Questionable cause. Repeat  a.m. labs     DVT prophylaxis:SCD's  Code Status:Full code Family Communication:None at bedside  Disposition Plan:Unclear at this time  Consultants:  Interventional radiology  PCCM   Procedures:  Ultrasound-guided thoracentesis 08/15/2016. Bedside thoracentesis on 08/19/2016  Repeat ultrasound-guided thoracentesis on 08/21/2016  Antimicrobials: None  Subjective: Patient seen and examined at bedside. She denies any overnight fever, nausea, vomiting. She speaks weak and tired  Objective: Vitals:   08/22/16 1512 08/22/16 2041 08/23/16 0521 08/23/16 1333  BP: 113/75 120/74 125/82 129/60  Pulse: (!) 108 (!) 104 (!) 102 (!) 107  Resp: 18 18 18 18   Temp: 97.4 F (36.3 C) 97.9 F (36.6 C) 98.7 F (37.1 C) 98.3 F (36.8 C)  TempSrc: Oral Oral Oral Oral  SpO2: 98% 98% 96% 98%  Weight:      Height:        Intake/Output Summary (Last 24 hours) at 08/23/16 1400 Last data filed at 08/23/16 0855  Gross per 24 hour  Intake              300 ml  Output                0 ml  Net              300 ml   Filed Weights   08/14/16 2334 08/16/16 2200 08/19/16 0400  Weight: 82.6 kg (182 lb 1.6 oz) 89 kg (196 lb 3.4 oz) 90.5 kg (199 lb 8.3 oz)    Examination:  General exam: Appears calm and comfortable  Respiratory system: Bilateral decreased breath sound at bases With scattered crackles Cardiovascular system: S1 & S2 heard, tachycardic  Gastrointestinal system: Abdomen is nondistended, soft and nontender. Normal bowel sounds heard. Extremities: No cyanosis, clubbing; trace pitting edema edema     Data Reviewed: I have personally reviewed following labs and imaging studies  CBC:  Recent Labs Lab 08/19/16 0307 08/20/16 0256 08/21/16 0607 08/22/16 0613 08/23/16 0650  WBC 11.1* 10.2 14.7* 10.3 10.8*  NEUTROABS  --   --  9.6* 7.2 7.5  HGB 8.9* 8.9* 9.0* 8.1* 7.9*  HCT 27.6* 28.2* 28.9* 26.1* 25.2*  MCV 92.0 93.4 95.7 93.9 94.4  PLT 403* 512* 636* 475*  086*   Basic Metabolic Panel:  Recent Labs Lab 08/19/16 0307 08/20/16 0256 08/21/16 0607 08/22/16 0613 08/23/16 0650  NA 138 137 137 139 139  K 4.2 4.4 4.7 4.7 5.4*  CL 111 111 112* 113* 114*  CO2 19* 18* 13* 20* 18*  GLUCOSE 82 87 83 104* 79  BUN 17 13 14 16 15   CREATININE 1.28* 1.27* 1.40* 1.44* 1.40*  CALCIUM 7.9* 8.3* 8.4* 8.2* 8.0*  MG  --  1.8 1.7 1.8 1.7   GFR: Estimated Creatinine Clearance: 45.9 mL/min (A) (by C-G formula based on SCr of 1.4 mg/dL (H)). Liver Function Tests:  Recent Labs Lab 08/19/16 1016 08/22/16 0613  AST  --  22  ALT  --  11*  ALKPHOS  --  94  BILITOT  --  0.5  PROT 5.2* 5.4*  ALBUMIN  --  1.6*    Recent Labs Lab 08/22/16 0613  LIPASE 21   No results for input(s): AMMONIA in the last 168 hours. Coagulation Profile:  Recent Labs Lab 08/21/16 1312  INR 1.59   Cardiac Enzymes: No results for input(s): CKTOTAL, CKMB, CKMBINDEX, TROPONINI in the last 168 hours. BNP (last 3 results) No results for input(s): PROBNP in the last 8760 hours. HbA1C: No results for input(s):  HGBA1C in the last 72 hours. CBG:  Recent Labs Lab 08/22/16 2155 08/23/16 0525 08/23/16 0726 08/23/16 0827 08/23/16 1115  GLUCAP 96 81 74 109* 94   Lipid Profile: No results for input(s): CHOL, HDL, LDLCALC, TRIG, CHOLHDL, LDLDIRECT in the last 72 hours. Thyroid Function Tests: No results for input(s): TSH, T4TOTAL, FREET4, T3FREE, THYROIDAB in the last 72 hours. Anemia Panel: No results for input(s): VITAMINB12, FOLATE, FERRITIN, TIBC, IRON, RETICCTPCT in the last 72 hours. Sepsis Labs: No results for input(s): PROCALCITON, LATICACIDVEN in the last 168 hours.  Recent Results (from the past 240 hour(s))  Urine culture     Status: None   Collection Time: 08/14/16 10:55 PM  Result Value Ref Range Status   Specimen Description URINE, RANDOM  Final   Special Requests NONE  Final   Culture   Final    NO GROWTH Performed at [facility],  [address] [place], [place] [address]    Report Status 08/16/2016 FINAL  Final  Culture, body fluid-bottle     Status: None   Collection Time: 08/15/16  1:40 PM  Result Value Ref Range Status   Specimen Description PLEURAL LEFT  Final   Special Requests BOTTLES DRAWN AEROBIC AND ANAEROBIC  Final   Culture   Final    NO GROWTH 5 DAYS Performed at [facility], [place] [address] [place], [place] [address]    Report Status 08/20/2016 FINAL  Final  Gram stain     Status: None   Collection Time: 08/15/16  1:40 PM  Result Value Ref Range Status   Specimen Description PLEURAL LEFT  Final   Special Requests NONE  Final   Gram Stain   Final    FEW WBC PRESENT,BOTH PMN AND MONONUCLEAR NO ORGANISMS SEEN Performed at [facility], [place] [address], [place], [place] [address]    Report Status 08/16/2016 FINAL  Final  MRSA PCR Screening     Status: None   Collection Time: 08/16/16 10:33 PM  Result Value Ref Range Status   MRSA by PCR NEGATIVE NEGATIVE Final    Comment:        The GeneXpert MRSA Assay (FDA approved for NASAL specimens only), is one component of a comprehensive MRSA colonization surveillance program. It is not intended to diagnose MRSA infection nor to guide or monitor treatment for MRSA infections.          Radiology Studies: Dg Chest Port 1 View  Result Date: 08/21/2016 CLINICAL DATA:  Post thoracentesis EXAM: PORTABLE CHEST 1 VIEW COMPARISON:  08/21/2016 FINDINGS: Decreased left pleural effusion with moderate residual. Improved aeration of left upper lobe. No definitive pneumothorax. Small right pleural effusion. Persistent bibasilar left greater than right atelectasis or pneumonia. Mild cardiomegaly. Aortic atherosclerosis. IMPRESSION: 1. Decreased pleural effusion on the left, no pneumothorax is seen. Moderate residual left pleural effusion. 2. Otherwise no change in small right pleural effusion and dense left greater than right bilateral lower lung  consolidations which may reflect atelectasis or pneumonia Electronically Signed   By: Donavan Foil M.D.   On: 08/21/2016 14:43   US Thoracentesis Asp Pleural Space W/img Guide  Result Date: 08/21/2016 INDICATION: Metastatic renal cell carcinoma, dyspnea, recurrent left pleural effusion; request made for therapeutic left thoracentesis. EXAM: ULTRASOUND GUIDED THERAPEUTIC LEFT THORACENTESIS MEDICATIONS: None. COMPLICATIONS: None immediate. PROCEDURE: An ultrasound guided thoracentesis was thoroughly discussed with the patient and questions answered. The benefits, risks, alternatives and complications were also discussed. The patient understands and  wishes to proceed with the procedure. Written consent was obtained. Ultrasound was performed to localize and mark an adequate pocket of fluid in the left chest. The area was then prepped and draped in the normal sterile fashion. 1% Lidocaine was used for local anesthesia. Under ultrasound guidance a Safe-T-Centesis catheter was introduced. Thoracentesis was performed. The catheter was removed and a dressing applied. FINDINGS: A total of approximately 1.5 liters of bloody fluid was removed. Only the above amount of fluid was removed at this time secondary to persistent patient coughing and chest discomfort. IMPRESSION: Successful ultrasound guided therapeutic left thoracentesis yielding 1.5 liters of pleural fluid. Read by: Rowe Robert, PA-C Electronically Signed   By: Sandi Mariscal M.D.   On: 08/21/2016 14:43        Scheduled Meds: . feeding supplement  1 Container Oral TID BM  . insulin aspart  0-9 Units Subcutaneous TID WC  . insulin glargine  10 Units Subcutaneous Daily  . mouth rinse  15 mL Mouth Rinse BID  . mirtazapine  15 mg Oral QHS  . pantoprazole  40 mg Oral QODAY  . rosuvastatin  10 mg Oral QHS  . sodium chloride flush  3 mL Intravenous Q12H   Continuous Infusions: . [START ON 08/25/2016]  ceFAZolin (ANCEF) IV       LOS: 9 days         Aline August, MD [facility] Pager [phone]  If 7PM-7AM, please contact night-coverage [email or web address] Password [email or web address] 08/23/2016, 2:00 PM

## 2016-08-24 DIAGNOSIS — C7802 Secondary malignant neoplasm of left lung: Secondary | ICD-10-CM

## 2016-08-24 LAB — CBC WITH DIFFERENTIAL/PLATELET
Basophils Absolute: 0 10*3/uL (ref 0.0–0.1)
Basophils Relative: 0 %
EOS ABS: 0.1 10*3/uL (ref 0.0–0.7)
Eosinophils Relative: 1 %
HCT: 26.3 % — ABNORMAL LOW (ref 36.0–46.0)
Hemoglobin: 8.2 g/dL — ABNORMAL LOW (ref 12.0–15.0)
Lymphocytes Relative: 18 %
Lymphs Abs: 1.9 10*3/uL (ref 0.7–4.0)
MCH: 29.6 pg (ref 26.0–34.0)
MCHC: 31.2 g/dL (ref 30.0–36.0)
MCV: 94.9 fL (ref 78.0–100.0)
MONO ABS: 0.9 10*3/uL (ref 0.1–1.0)
MONOS PCT: 9 %
Neutro Abs: 7.8 10*3/uL — ABNORMAL HIGH (ref 1.7–7.7)
Neutrophils Relative %: 72 %
PLATELETS: 463 10*3/uL — AB (ref 150–400)
RBC: 2.77 MIL/uL — ABNORMAL LOW (ref 3.87–5.11)
RDW: 18.8 % — AB (ref 11.5–15.5)
WBC: 10.8 10*3/uL — ABNORMAL HIGH (ref 4.0–10.5)

## 2016-08-24 LAB — BASIC METABOLIC PANEL
Anion gap: 8 (ref 5–15)
BUN: 15 mg/dL (ref 6–20)
CALCIUM: 8.1 mg/dL — AB (ref 8.9–10.3)
CO2: 17 mmol/L — AB (ref 22–32)
CREATININE: 1.34 mg/dL — AB (ref 0.44–1.00)
Chloride: 113 mmol/L — ABNORMAL HIGH (ref 101–111)
GFR calc non Af Amer: 40 mL/min — ABNORMAL LOW (ref >60)
GFR, EST AFRICAN AMERICAN: 46 mL/min — AB (ref >60)
GLUCOSE: 115 mg/dL — AB (ref 65–99)
Potassium: 4.9 mmol/L (ref 3.5–5.1)
Sodium: 138 mmol/L (ref 135–145)

## 2016-08-24 LAB — GLUCOSE, CAPILLARY
GLUCOSE-CAPILLARY: 106 mg/dL — AB (ref 65–99)
GLUCOSE-CAPILLARY: 152 mg/dL — AB (ref 65–99)
Glucose-Capillary: 161 mg/dL — ABNORMAL HIGH (ref 65–99)
Glucose-Capillary: 76 mg/dL (ref 65–99)

## 2016-08-24 NOTE — Progress Notes (Signed)
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 In ER found to be dehydrated, renal fxn had decline and found to have large to mod sized left effusion. S/P left thoracentesis on 5/19.     PT Comments    Assisted OOB to [facility] then amb twice with RW on RA sats avg 96 but pt c/o 3/4 Dyspnea.  "I have fluid in my lungs" stated pt.  Follow Up Recommendations  Home health PT;Supervision for mobility/OOB     Equipment Recommendations  None recommended by PT    Recommendations for Other Services       Precautions / Restrictions Precautions Precautions: Fall Precaution Comments: monitor vitals Restrictions Weight Bearing Restrictions: No    Mobility  Bed Mobility Overal bed mobility: Needs Assistance Bed Mobility: Sit to Supine;Supine to Sit     Supine to sit: Supervision;Min guard Sit to supine: Supervision;Min guard   General bed mobility comments: increased time and assist with scooting to EOB  Transfers Overall transfer level: Needs assistance Equipment used: Rolling walker (2 wheeled) Transfers: Sit to/from Stand Sit to Stand: Min guard         General transfer comment: min/guard for safety  Ambulation/Gait Ambulation/Gait assistance: Min guard Ambulation Distance (Feet): 84 Feet Assistive device: Rolling walker (2 wheeled) Gait Pattern/deviations: Step-through pattern;Decreased stance time - right;Antalgic Gait velocity: decreased   General Gait Details: tolerated an increased distance but with Mod dyspnea.  On esitting rest break   Financial trader [employer] (Stroke Patients Only)       Balance                                             Cognition                                              Exercises      General Comments        Pertinent Vitals/Pain      Home Living                      Prior Function            PT Goals (current goals can now be found in the care plan section) Progress towards PT goals: Progressing toward goals    Frequency    Min 3X/week      PT Plan Current plan remains appropriate    Co-evaluation              AM-PAC PT "6 Clicks" Daily Activity  Outcome Measure  Difficulty turning over in bed (including adjusting bedclothes, sheets and blankets)?: None Difficulty moving from lying on back to sitting on the side of the bed? : A Little Difficulty sitting down on and standing up from a chair with arms (e.g., wheelchair, bedside commode, etc,.)?: A  Little Help needed moving to and from a bed to chair (including a wheelchair)?: A Little Help needed walking in hospital room?: A Little Help needed climbing 3-5 steps with a railing? : A Little 6 Click Score: 19    End of Session Equipment Utilized During Treatment: Gait belt Activity Tolerance: Patient limited by fatigue (dyspnea) Patient left: in chair;with call bell/phone within reach Nurse Communication: Mobility status PT Visit Diagnosis: Other abnormalities of gait and mobility (R26.89)     Time: 3606-7703 PT Time Calculation (min) (ACUTE ONLY): 27 min  Charges:  $Gait Training: 8-22 mins $Therapeutic Activity: 8-22 mins                    G Codes:       {Zan Triska  PTA [facility]  Acute  Rehab Pager      [phone]

## 2016-08-24 NOTE — Progress Notes (Signed)
Patient ID: Laura Neal, female   DOB: 15-Feb-1951, 66 y.o.   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 Patient reported symptoms to her oncologist who recommended to stop her medications and report to the emergency department. In the ED she was found to be dehydrated with creatinine of 2.9 BUN of 34. Chest x-ray was done which showed moderate to large pleural effusion worsened from prior studies and a lactic acid level of 2.24. Patient was admitted for IV hydration and further evaluation. Patient had thoracentesison 08/15/16 which yielded 2 L of bloody fluids. Subsequently Hgb dropped and effusion worsened. CCM consulted. Pleural fluid cytology was suspicious for malignant cells. She had a repeat thoracentesis done at bedside on 05/23/2018at bedside by PCCM, which was negative for malignant cells. PCCM recommended cardiothoracic surgery evaluation for needs for possible chest tube versus Pleurx catheter versus VATS. Cardiothoracic surgery was consulted on phone, Dr. Servando Snare reviewed the images and recommended interventional radiology guided Pleurx catheter placement. Patient required repeat ultrasound-guided thoracentesis on 08/21/2016 and 1.5 L of fluid was drained. Patient would probably have IR guided Pleurx catheter placed on 08/25/2016.   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 Hemothorax ? Malignant vs Traumatic  Patient is status post ultrasound-guided thoracentesis 08/15/16 - Tap was bloody. fluid analysis was suspicious for malignant cells Effusion continues toworsen Patient had repeat bedsidethoracentesis done on 08/19/2016, which was negative for malignant cells PCCM recommended cardiothoracic surgery evaluation for needs for possible chest tube versus Pleurx catheter versus VATS. Cardiothoracic surgery was consulted on phone, Dr. Servando Snare reviewed the images and recommended interventional radiology guided Pleurx catheter placement. Patient required repeat ultrasound-guided thoracentesis on 08/21/2016 and 1.5 L of fluid was drained. Patient would probably have IR guided Pleurx catheter placed on 08/25/2016.    Respiratory status is stable for now Nothing by mouth for tomorrow morning for probable Pleurx catheter placement by IR   Acute kidney injuryon chronic kidney disease stage III. In view of volume depletion and poor oral appetite.  Resolved Holding HCTZ, losartan and Votrient  Continue to monitor   Acute blood loss anemia ? Hemothorax from traumatic tap, in view of anticoagulation  S/p 2 units of PRBC's  Hgb stable currently at 8.2 Holding off on anticoagulation and ASA  Hypotension- probably due toblood loss  Resolved Monitor BP; hold antihypertensives for now  Type 2 diabetes mellitus -  Holding home medications Continue SSI and Lantus Monitor CBG  History of DVT/PE A/c on hold due to bleeding   Moderate protein caloric malnutrition due to chronic illness Dietitian recommendations appreciated  Renal cell carcinoma with lung metastasis  Oncology recommendations appreciated  Holding Votrient    Leukocytosis Resolved  Thrombocytosis  Probably reactive. Monitor  Hypoalbuminemia Follow up dietary recommendations  Hyperkalemia Questionable cause. Resolved     DVT prophylaxis:SCD's  Code Status:Full code Family Communication:None at bedside  Disposition Plan:Unclear at this time  Consultants:  Interventional radiology  PCCM   Procedures:  Ultrasound-guided thoracentesis 08/15/2016.Bedside thoracentesis on 08/19/2016  Repeat ultrasound-guided thoracentesis on 08/21/2016  Antimicrobials: None   Subjective: Patient seen and examined at bedside. No overnight fever, nausea, vomiting or chest pain. She still is very weak and tired.  Objective: Vitals:   08/23/16 0521 08/23/16 1333 08/23/16 2118 08/24/16 0541  BP: 125/82 129/60 120/64 (!) 118/58  Pulse: (!) 102 (!) 107 (!) 105 (!) 102  Resp: 18 18 16 18   Temp: 98.7 F (37.1 C) 98.3 F (36.8 C) 98.6 F (37 C) 98.9 F (37.2 C)  TempSrc: Oral Oral Oral Oral  SpO2: 96% 98% 94% 91%  Weight:      Height:        Intake/Output Summary (Last 24 hours) at 08/24/16 1322 Last data filed at 08/24/16 1158  Gross per 24 hour  Intake              120 ml  Output                0 ml  Net              120 ml   Filed Weights   08/14/16 2334 08/16/16 2200 08/19/16 0400  Weight: 82.6 kg (182 lb 1.6 oz) 89 kg (196 lb 3.4 oz) 90.5 kg (199 lb 8.3 oz)    Examination:  General exam: Appears calm and comfortable  Respiratory system: Bilateral decreased breath sound at bases With scattered crackles Cardiovascular system: S1 & S2 heard, intermittent tachycardia  Gastrointestinal system: Abdomen is nondistended, soft and nontender. Normal bowel sounds heard. Extremities: No cyanosis, clubbing; trace pitting pedal edema    Data Reviewed: I have personally reviewed following labs and imaging studies  CBC:  Recent Labs Lab 08/20/16 0256 08/21/16 0607 08/22/16 0613 08/23/16 0650 08/24/16 0640   WBC 10.2 14.7* 10.3 10.8* 10.8*  NEUTROABS  --  9.6* 7.2 7.5 7.8*  HGB 8.9* 9.0* 8.1* 7.9* 8.2*  HCT 28.2* 28.9* 26.1* 25.2* 26.3*  MCV 93.4 95.7 93.9 94.4 94.9  PLT 512* 636* 475* 466* 630*   Basic Metabolic Panel:  Recent Labs Lab 08/20/16 0256 08/21/16 0607 08/22/16 0613 08/23/16 0650 08/24/16 0640  NA 137 137 139 139 138  K 4.4 4.7 4.7 5.4* 4.9  CL 111 112* 113* 114* 113*  CO2 18* 13* 20* 18* 17*  GLUCOSE 87 83 104* 79 115*  BUN 13 14 16 15 15   CREATININE 1.27* 1.40* 1.44* 1.40* 1.34*  CALCIUM 8.3* 8.4* 8.2* 8.0* 8.1*  MG 1.8 1.7 1.8 1.7  --    GFR: Estimated Creatinine Clearance: 47.9 mL/min (A) (by C-G formula based on SCr of 1.34 mg/dL (H)). Liver Function Tests:  Recent Labs Lab 08/19/16 1016 08/22/16 0613  AST  --  22  ALT  --  11*  ALKPHOS  --  94  BILITOT  --  0.5  PROT 5.2* 5.4*  ALBUMIN  --  1.6*    Recent Labs Lab 08/22/16 0613  LIPASE 21   No results for input(s): AMMONIA in the last 168 hours. Coagulation Profile:  Recent Labs Lab 08/21/16 1312  INR 1.59   Cardiac Enzymes: No results for input(s): CKTOTAL, CKMB, CKMBINDEX, TROPONINI in the last 168 hours. BNP (last 3 results) No  results for input(s): PROBNP in the last 8760 hours. HbA1C: No results for input(s): HGBA1C in the last 72 hours. CBG:  Recent Labs Lab 08/23/16 1115 08/23/16 1651 08/23/16 2126 08/24/16 0723 08/24/16 1125  GLUCAP 94 120* 139* 106* 152*   Lipid Profile: No results for input(s): CHOL, HDL, LDLCALC, TRIG, CHOLHDL, LDLDIRECT in the last 72 hours. Thyroid Function Tests: No results for input(s): TSH, T4TOTAL, FREET4, T3FREE, THYROIDAB in the last 72 hours. Anemia Panel: No results for input(s): VITAMINB12, FOLATE, FERRITIN, TIBC, IRON, RETICCTPCT in the last 72 hours. Sepsis Labs: No results for input(s): PROCALCITON, LATICACIDVEN in the last 168 hours.  Recent Results (from the past 240 hour(s))  Urine culture     Status: None   Collection Time:  08/14/16 10:55 PM  Result Value Ref Range Status   Specimen Description URINE, RANDOM  Final   Special Requests NONE  Final   Culture   Final    NO GROWTH Performed at [facility], [address], [place], [place] [address]    Report Status 08/16/2016 FINAL  Final  Culture, body fluid-bottle     Status: None   Collection Time: 08/15/16  1:40 PM  Result Value Ref Range Status   Specimen Description PLEURAL LEFT  Final   Special Requests BOTTLES DRAWN AEROBIC AND ANAEROBIC  Final   Culture   Final    NO GROWTH 5 DAYS Performed at [facility], [place] [address] [place], [place] [address]    Report Status 08/20/2016 FINAL  Final  Gram stain     Status: None   Collection Time: 08/15/16  1:40 PM  Result Value Ref Range Status   Specimen Description PLEURAL LEFT  Final   Special Requests NONE  Final   Gram Stain   Final    FEW WBC PRESENT,BOTH PMN AND MONONUCLEAR NO ORGANISMS SEEN Performed at [facility], [place] [address], [place], [place] [address]    Report Status 08/16/2016 FINAL  Final  MRSA PCR Screening     Status: None   Collection Time: 08/16/16 10:33 PM  Result Value Ref Range Status   MRSA by PCR NEGATIVE NEGATIVE Final    Comment:        The GeneXpert MRSA Assay (FDA approved for NASAL specimens only), is one component of a comprehensive MRSA colonization surveillance program. It is not intended to diagnose MRSA infection nor to guide or monitor treatment for MRSA infections.          Radiology Studies: No results found.      Scheduled Meds: . feeding supplement  1 Container Oral TID BM  . insulin aspart  0-9 Units Subcutaneous TID WC  . insulin glargine  10 Units Subcutaneous Daily  . mouth rinse  15 mL Mouth Rinse BID  . mirtazapine  15 mg Oral QHS  . pantoprazole  40 mg Oral QODAY  . rosuvastatin  10 mg Oral QHS  . sodium chloride flush  3 mL Intravenous Q12H   Continuous Infusions: . [START ON 08/25/2016]  ceFAZolin (ANCEF)  IV       LOS: 10 days        Aline August, MD [facility] Pager [phone]  If 7PM-7AM, please contact night-coverage [email or web address] Password [email or web address] 08/24/2016, 1:22 PM

## 2016-08-24 NOTE — Progress Notes (Signed)
Pt noticed a small blister in her right AC where an IV used to be. Pt denies pain or itching at the sight and states she had just recently noticed it. Clear Tegaderm dressing was placed over it so it would not be bumped and open up per patients request.

## 2016-08-24 NOTE — Progress Notes (Signed)
Assessment & Plan:    Date:  Aug 24, 2016  Chart reviewed for concurrent status and case management needs.  Will continue to follow patient progress.  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

## 2016-08-25 ENCOUNTER — Encounter (HOSPITAL_COMMUNITY): Payer: Self-pay | Admitting: Interventional Radiology

## 2016-08-25 ENCOUNTER — Inpatient Hospital Stay (HOSPITAL_COMMUNITY): Payer: Medicare Other

## 2016-08-25 HISTORY — PX: IR GUIDED DRAIN W CATHETER PLACEMENT: IMG719

## 2016-08-25 LAB — GLUCOSE, CAPILLARY
GLUCOSE-CAPILLARY: 92 mg/dL (ref 65–99)
Glucose-Capillary: 164 mg/dL — ABNORMAL HIGH (ref 65–99)
Glucose-Capillary: 146 mg/dL — ABNORMAL HIGH (ref 65–99)
Glucose-Capillary: 131 mg/dL — ABNORMAL HIGH (ref 65–99)

## 2016-08-25 LAB — CBC WITH DIFFERENTIAL/PLATELET
BASOS ABS: 0 10*3/uL (ref 0.0–0.1)
BASOS PCT: 0 %
Eosinophils Absolute: 0.2 10*3/uL (ref 0.0–0.7)
Eosinophils Relative: 2 %
HEMATOCRIT: 27.3 % — AB (ref 36.0–46.0)
HEMOGLOBIN: 8.3 g/dL — AB (ref 12.0–15.0)
Lymphocytes Relative: 17 %
Lymphs Abs: 2.1 10*3/uL (ref 0.7–4.0)
MCH: 29 pg (ref 26.0–34.0)
MCHC: 30.4 g/dL (ref 30.0–36.0)
MCV: 95.5 fL (ref 78.0–100.0)
MONO ABS: 1.1 10*3/uL — AB (ref 0.1–1.0)
Monocytes Relative: 9 %
NEUTROS ABS: 8.9 10*3/uL — AB (ref 1.7–7.7)
NEUTROS PCT: 72 %
Platelets: 481 10*3/uL — ABNORMAL HIGH (ref 150–400)
RBC: 2.86 MIL/uL — AB (ref 3.87–5.11)
RDW: 18.7 % — AB (ref 11.5–15.5)
WBC: 12.2 10*3/uL — AB (ref 4.0–10.5)

## 2016-08-25 LAB — BASIC METABOLIC PANEL
ANION GAP: 9 (ref 5–15)
BUN: 15 mg/dL (ref 6–20)
CO2: 17 mmol/L — AB (ref 22–32)
Calcium: 8.1 mg/dL — ABNORMAL LOW (ref 8.9–10.3)
Chloride: 113 mmol/L — ABNORMAL HIGH (ref 101–111)
Creatinine, Ser: 1.39 mg/dL — ABNORMAL HIGH (ref 0.44–1.00)
GFR calc non Af Amer: 38 mL/min — ABNORMAL LOW (ref >60)
GFR, EST AFRICAN AMERICAN: 44 mL/min — AB (ref >60)
Glucose, Bld: 94 mg/dL (ref 65–99)
POTASSIUM: 4.3 mmol/L (ref 3.5–5.1)
Sodium: 139 mmol/L (ref 135–145)

## 2016-08-25 LAB — PROTIME-INR
INR: 1.49
Prothrombin Time: 18.2 seconds — ABNORMAL HIGH (ref 11.4–15.2)

## 2016-08-25 MED ORDER — CEFAZOLIN SODIUM-DEXTROSE 2-4 GM/100ML-% IV SOLN
INTRAVENOUS | Status: AC
  Filled 2016-08-25: qty 100

## 2016-08-25 MED ORDER — MIDAZOLAM HCL 2 MG/2ML IJ SOLN
INTRAMUSCULAR | Status: AC
  Filled 2016-08-25: qty 6

## 2016-08-25 MED ORDER — LIDOCAINE HCL 1 % IJ SOLN
INTRAMUSCULAR | Status: AC
  Filled 2016-08-25: qty 20

## 2016-08-25 MED ORDER — FENTANYL CITRATE (PF) 100 MCG/2ML IJ SOLN
INTRAMUSCULAR | Status: AC
  Filled 2016-08-25: qty 4

## 2016-08-25 MED ORDER — FENTANYL CITRATE (PF) 100 MCG/2ML IJ SOLN
INTRAMUSCULAR | Status: AC | PRN
  Administered 2016-08-25: 25 ug via INTRAVENOUS

## 2016-08-25 MED ORDER — LIDOCAINE-EPINEPHRINE (PF) 2 %-1:200000 IJ SOLN
INTRAMUSCULAR | Status: AC | PRN
  Administered 2016-08-25: 10 mL via INTRADERMAL

## 2016-08-25 MED ORDER — FUROSEMIDE 10 MG/ML IJ SOLN
40.0000 mg | Freq: Every day | INTRAMUSCULAR | Status: DC
  Administered 2016-08-25 – 2016-08-26 (×2): 40 mg via INTRAVENOUS
  Filled 2016-08-25 (×2): qty 4

## 2016-08-25 MED ORDER — MIDAZOLAM HCL 2 MG/2ML IJ SOLN
INTRAMUSCULAR | Status: AC | PRN
  Administered 2016-08-25 (×2): 1 mg via INTRAVENOUS

## 2016-08-25 MED ORDER — LIDOCAINE-EPINEPHRINE (PF) 2 %-1:200000 IJ SOLN
INTRAMUSCULAR | Status: AC
  Filled 2016-08-25: qty 20

## 2016-08-25 NOTE — Progress Notes (Addendum)
Patient ID: Laura Neal, female   DOB: Oct 18, 1950, 66 y.o.   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 Patient reported symptoms to her oncologist who recommended to stop her medications and report to the emergency department. In the ED she was found to be dehydrated with creatinine of 2.9 BUN of 34. Chest x-ray was done which showed moderate to large pleural effusion worsened from prior studies and a lactic acid level of 2.24. Patient was admitted for IV hydration and further evaluation. Patient had thoracentesison 08/15/16 which yielded 2 L of bloody fluids. Subsequently Hgb dropped and effusion worsened. CCM consulted. Pleural fluid cytology was suspicious for malignant cells. She had a repeat thoracentesis done at bedside on 05/23/2018at bedside by PCCM, which was negative for malignant cells. PCCM recommended cardiothoracic surgery evaluation for needs for possible chest tube versus Pleurx catheter versus VATS. Cardiothoracic surgery was consulted on phone, Dr. Servando Snare reviewed the images and recommended interventional radiology guided Pleurx catheter placement. Patient required repeat ultrasound-guided thoracentesis on 08/21/2016 and 1.5 L of fluid was drained. Patient had IR guided Pleurx catheter placed today on 08/25/2016.  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 Hemothorax ? Malignant vs Traumatic  Patient is status post ultrasound-guided thoracentesis 08/15/16 - Tap was bloody. fluid analysis was suspicious for malignant cells Effusion continues toworsen Patient had repeat bedsidethoracentesis done on 08/19/2016, which was negative for malignant cells PCCM recommended cardiothoracic surgery evaluation for need for possible chest tube versus Pleurx catheter versus VATS. Cardiothoracic surgery was consulted on phone, Dr. Servando Snare reviewed the images and recommended interventional radiology guided Pleurx catheter placement. Patient required repeat ultrasound-guided thoracentesis on 08/21/2016 and 1.5 L of fluid was drained.  Patient just had IR guided Pleurx catheter placed today on 08/25/2016. Follow up with IR regarding care of Pleurx catheter.    Respiratory status is stable for now  Acute kidney injuryon chronic kidney disease stage III. In view of volume depletion and poor oral appetite.  Resolved Holding Votrient. Might consider restarting antihypertensives on discharge if BP is elevated. Continue to monitor   Acute blood loss anemia ? Hemothorax from traumatic tap, in view of anticoagulation  S/p 2 units of PRBC's  Hgb stable currently at 8.3 Holding off on anticoagulation and ASA.   Hypotension- probably due toblood loss  Resolved Monitor BP;  Antihypertensives are on hold for now Consider low dose iv Lasix because of positive fluid balance since admission.  Type 2 diabetes mellitus -  Holding home medications Continue SSI and Lantus Monitor CBG  History of DVT/PE A/c on hold due to bleeding. Pleural fluid was bloody again today as per interventional radiology. I spoke to Dr. Alen Blew about  the patient and will continue holding off on  anticoagulation until outpatient evaluation by Dr. Alen Blew.  Moderate protein caloric malnutrition due to chronic illness Dietitian recommendations appreciated  Renal cell carcinoma with lung metastasis  Follow-up with oncology as an outpatient Holding Votrient   Leukocytosis Probably reactive. Repeat labs in a.m.  Thrombocytosis Probably reactive. Monitor  Hypoalbuminemia Follow up dietary recommendations  Hyperkalemia Questionable cause. Resolved  Stage II sacral decubitus ulcer: present on admission Outpatient follow up with Wound care    DVT prophylaxis:SCD's  Code Status:Full code Family Communication:None at bedside  Disposition Plan:Discharge home in 1-2 days if stable.  Consultants:  Interventional radiology  PCCM   Procedures:  Ultrasound-guided thoracentesis 08/15/2016.Bedside thoracentesis on 08/19/2016  Repeat ultrasound-guided thoracentesis on 08/21/2016  IR Pleurx catheter placement on 08/25/2016  Antimicrobials: None  Subjective: Patient seen and examined at bedside. She feels weak and tired. No overnight fever, nausea, vomiting.  Objective: Vitals:   08/25/16 1030 08/25/16 1035 08/25/16 1059 08/25/16 1301  BP: 136/74 133/78 (!) 123/54 (!) 106/57  Pulse: (!) 111 (!) 113 (!) 108 96  Resp: 17 11 18    Temp:   97.7 F (36.5 C) 97.4 F (36.3 C)  TempSrc:   Oral Oral  SpO2: 93% 93% 93% 95%  Weight:      Height:        Intake/Output Summary (Last 24 hours) at 08/25/16 1320 Last data filed at 08/25/16 0900  Gross per 24 hour  Intake                0 ml  Output              400 ml  Net             -400 ml   Filed Weights   08/14/16 2334 08/16/16 2200 08/19/16 0400  Weight: 82.6 kg (182 lb 1.6 oz) 89 kg (196 lb 3.4 oz) 90.5 kg (199 lb 8.3 oz)    Examination:  General exam: Appears calm and comfortable  Respiratory system: Bilateral decreased breath sound at bases With scattered crackles Cardiovascular system: S1  & S2 heard, intermittent tachycardia  Gastrointestinal system: Abdomen is nondistended, soft and nontender. Normal bowel sounds heard. Extremities: No cyanosis, clubbing; 1+ pitting pedal edema    Data Reviewed: I have personally reviewed following labs and imaging studies  CBC:  Recent Labs Lab 08/21/16 0607 08/22/16 0613 08/23/16 0650 08/24/16 0640 08/25/16 0609  WBC 14.7* 10.3 10.8* 10.8* 12.2*  NEUTROABS 9.6* 7.2 7.5 7.8* 8.9*  HGB 9.0* 8.1* 7.9* 8.2* 8.3*  HCT 28.9* 26.1* 25.2* 26.3* 27.3*  MCV 95.7 93.9 94.4 94.9 95.5  PLT 636* 475* 466* 463* 161*   Basic Metabolic Panel:  Recent Labs Lab 08/20/16 0256 08/21/16 0607 08/22/16 0613 08/23/16 0650 08/24/16 0640 08/25/16 0609  NA 137 137 139 139 138 139  K 4.4 4.7 4.7 5.4* 4.9 4.3  CL 111 112* 113* 114* 113* 113*  CO2 18* 13* 20* 18* 17* 17*  GLUCOSE 87 83 104* 79 115* 94  BUN 13 14 16 15 15 15   CREATININE 1.27* 1.40* 1.44* 1.40* 1.34* 1.39*  CALCIUM 8.3* 8.4* 8.2* 8.0* 8.1* 8.1*  MG 1.8 1.7 1.8 1.7  --   --    GFR: Estimated Creatinine Clearance: 46.2 mL/min (A) (by C-G formula based on SCr of 1.39 mg/dL (H)). Liver Function Tests:  Recent Labs Lab 08/19/16 1016 08/22/16 0613  AST  --  22  ALT  --  11*  ALKPHOS  --  94  BILITOT  --  0.5  PROT 5.2* 5.4*  ALBUMIN  --  1.6*    Recent Labs Lab 08/22/16 0613  LIPASE 21   No results for input(s): AMMONIA in the last 168 hours. Coagulation Profile:  Recent Labs Lab 08/21/16 1312 08/25/16 0609  INR 1.59 1.49   Cardiac Enzymes: No results for input(s): CKTOTAL, CKMB, CKMBINDEX, TROPONINI in the last 168 hours. BNP (last 3 results) No results for input(s): PROBNP in the last 8760 hours. HbA1C: No results for input(s): HGBA1C in the last 72 hours. CBG:  Recent Labs Lab 08/24/16 1125 08/24/16 1653 08/24/16 2110 08/25/16 0749 08/25/16 1215  GLUCAP 152* 161* 76 92 164*   Lipid Profile: No results for input(s): CHOL, HDL, LDLCALC, TRIG,  CHOLHDL, LDLDIRECT in the last 72 hours. Thyroid Function Tests: No results for input(s): TSH, T4TOTAL, FREET4, T3FREE, THYROIDAB in the last 72 hours. Anemia Panel: No results for input(s): VITAMINB12, FOLATE, FERRITIN, TIBC, IRON, RETICCTPCT in the last 72 hours. Sepsis Labs: No results for input(s): PROCALCITON, LATICACIDVEN in the last 168 hours.  Recent Results (from the past 240 hour(s))  Culture, body fluid-bottle     Status: None   Collection Time: 08/15/16  1:40 PM  Result Value Ref Range Status   Specimen Description PLEURAL LEFT  Final   Special Requests BOTTLES DRAWN AEROBIC AND ANAEROBIC  Final   Culture   Final    NO GROWTH 5 DAYS Performed at [facility], [place] [address], [place], [place] [address]    Report Status 08/20/2016 FINAL  Final  Gram stain     Status: None   Collection Time: 08/15/16  1:40 PM  Result Value Ref Range Status   Specimen Description PLEURAL LEFT  Final   Special Requests NONE  Final   Gram Stain   Final    FEW WBC PRESENT,BOTH PMN AND MONONUCLEAR NO ORGANISMS SEEN Performed at [facility], [place] [address], [place], [place] [address]    Report Status 08/16/2016 FINAL  Final  MRSA PCR Screening     Status: None   Collection Time: 08/16/16 10:33 PM  Result Value Ref Range Status   MRSA by PCR NEGATIVE NEGATIVE Final    Comment:        The GeneXpert MRSA Assay (FDA approved for NASAL specimens only), is one component of a comprehensive MRSA colonization surveillance program. It is not intended to diagnose MRSA infection nor to guide or monitor treatment for MRSA infections.          Radiology Studies: Ir Lenise Arena W Catheter Placement  Result Date: 08/25/2016 INDICATION: Recurrent malignant left pleural effusion EXAM: ULTRASOUND AND FLUOROSCOPIC LEFT TUNNELED PLEURAL DRAIN (PLEURX CATHETER) MEDICATIONS: Ancef 2 g, administered within 1 hour of the procedure ANESTHESIA/SEDATION: Fentanyl 100 mcg IV; Versed 2.0 mg  IV Moderate Sedation Time:  20 minutes The patient was continuously monitored during the procedure by the interventional radiology nurse under my direct supervision. COMPLICATIONS: None immediate. PROCEDURE: Informed written consent was obtained from the patient after a thorough discussion of the procedural risks, benefits and alternatives. All questions were addressed. Maximal Sterile Barrier Technique was utilized including caps, mask, sterile gowns, sterile gloves, sterile drape, hand hygiene and skin antiseptic. A timeout was performed prior to the initiation of the procedure. Previous imaging reviewed. Right lower chest marked in the mid axillary line through a lower intercostal space. Under sterile conditions and local anesthesia, ultrasound percutaneous access performed of  the left pleural space. Needle position confirmed with ultrasound. Images obtained for documentation. There was return of bloody pleural fluid. Amplatz guidewire inserted. In the adjacent soft tissues, a subcutaneous tunnel was created. The PleurX catheter was tunneled subcutaneously to the puncture site and advanced into the left chest through the valved pleural sheath. Position confirmed with fluoroscopy. Thoracentesis performed yielding 5 minutes 50 cc bloody pleural fluid. Catheter secured with a Prolene suture. Entry site closed with subcuticular Vicryl suture and derma bond. Sterile dressing applied. No immediate complication. Patient tolerated the procedure well. IMPRESSION: Successful ultrasound and fluoroscopic 15.5 French left tunneled pleural drain (PleurX catheter). 550 cc left thoracentesis performed after insertion. Electronically Signed   By: Jerilynn Mages.  Shick M.D.   On: 08/25/2016 10:44        Scheduled Meds: . feeding supplement  1 Container Oral TID BM  . fentaNYL      . insulin aspart  0-9 Units Subcutaneous TID WC  . insulin glargine  10 Units Subcutaneous Daily  . lidocaine-EPINEPHrine      . mouth rinse  15 mL  Mouth Rinse BID  . midazolam      . mirtazapine  15 mg Oral QHS  . pantoprazole  40 mg Oral QODAY  . rosuvastatin  10 mg Oral QHS  . sodium chloride flush  3 mL Intravenous Q12H   Continuous Infusions:   LOS: 11 days        Laura August, MD [facility] Pager [phone]  If 7PM-7AM, please contact night-coverage [email or web address] Password [email or web address] 08/25/2016, 1:20 PM

## 2016-08-25 NOTE — Procedures (Signed)
Recurrent malignant left pleural effusion, shortness of breath  Status post left Pleurx drain  550 mL left thoracentesis performed after insertion  No immediate complication   EBL 0  Full report in PACs

## 2016-08-25 NOTE — Progress Notes (Signed)
Nutrition Follow-up  DOCUMENTATION CODES:   Severe malnutrition in context of chronic illness  INTERVENTION:   - Continue Boost Breeze po TID, each supplement provides 250 kcal and 9 grams of protein - Continue to encourage PO intakes of meals, supplements, and beverages. - RD will continue to monitor for additional needs.  NUTRITION DIAGNOSIS:   Malnutrition (Severe) related to chronic illness, cancer and cancer related treatments as evidenced by percent weight loss, energy intake < or equal to 75% for > or equal to 1 month.  Ongoing.  GOAL:   Patient will meet greater than or equal to 90% of their needs  Progressing.  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 Patient reported symptoms to her oncologist who recommended to stop her medications and report to the emergency department for an operation. In the ED she was found to be dehydrated with creatinine of 2.9 BUN of 34. Chest x-ray was done which showed moderate to large pleural effusion worsened from prior studies and a lactic acid level of 2.24. Patient admitted for IV hydration and further evaluation.  Patient currently consuming 50-80% of meals prior to diet changing to NPO for pending pleuryx catheter placement. Patient is drinking Boost Breeze supplements.   Labs reviewed. Medications: Remeron tablet daily, Protonix tablet every other day  Diet Order:  Diet NPO time specified Except for: Sips with Meds  Skin:  Wound (see comment) (Stage 2 sacral and R buttocks pressure injuries)  Last BM:  5/28  Height:   Ht Readings from Last 1 Encounters:  08/16/16 5\' 8"  (1.727 m)    Weight:   Wt Readings from Last 1 Encounters:   08/19/16 199 lb 8.3 oz (90.5 kg)    Ideal Body Weight:  63.6 kg  BMI:  Body mass index is 30.34 kg/m.  Estimated Nutritional Needs:   Kcal:  2000-2200  Protein:  95-105g  Fluid:  2L/day  EDUCATION NEEDS:   No education needs identified at this time  Clayton Bibles, MS, RD, LDN Pager: [phone] After Hours Pager: [phone]

## 2016-08-26 LAB — BASIC METABOLIC PANEL
Anion gap: 8 (ref 5–15)
BUN: 15 mg/dL (ref 6–20)
CO2: 19 mmol/L — ABNORMAL LOW (ref 22–32)
Calcium: 8.1 mg/dL — ABNORMAL LOW (ref 8.9–10.3)
Chloride: 115 mmol/L — ABNORMAL HIGH (ref 101–111)
Creatinine, Ser: 1.56 mg/dL — ABNORMAL HIGH (ref 0.44–1.00)
GFR calc Af Amer: 39 mL/min — ABNORMAL LOW (ref >60)
GFR calc non Af Amer: 33 mL/min — ABNORMAL LOW (ref >60)
Glucose, Bld: 104 mg/dL — ABNORMAL HIGH (ref 65–99)
Potassium: 3.9 mmol/L (ref 3.5–5.1)
Sodium: 142 mmol/L (ref 135–145)

## 2016-08-26 LAB — CBC WITH DIFFERENTIAL/PLATELET
Basophils Absolute: 0 10*3/uL (ref 0.0–0.1)
Basophils Relative: 0 %
Eosinophils Absolute: 0.1 10*3/uL (ref 0.0–0.7)
Eosinophils Relative: 1 %
HCT: 25.6 % — ABNORMAL LOW (ref 36.0–46.0)
Hemoglobin: 7.8 g/dL — ABNORMAL LOW (ref 12.0–15.0)
Lymphocytes Relative: 16 %
Lymphs Abs: 1.8 10*3/uL (ref 0.7–4.0)
MCH: 28.8 pg (ref 26.0–34.0)
MCHC: 30.5 g/dL (ref 30.0–36.0)
MCV: 94.5 fL (ref 78.0–100.0)
Monocytes Absolute: 1 10*3/uL (ref 0.1–1.0)
Monocytes Relative: 9 %
Neutro Abs: 8.3 10*3/uL — ABNORMAL HIGH (ref 1.7–7.7)
Neutrophils Relative %: 74 %
Platelets: 420 10*3/uL — ABNORMAL HIGH (ref 150–400)
RBC: 2.71 MIL/uL — ABNORMAL LOW (ref 3.87–5.11)
RDW: 18.9 % — ABNORMAL HIGH (ref 11.5–15.5)
WBC: 11.2 10*3/uL — ABNORMAL HIGH (ref 4.0–10.5)

## 2016-08-26 LAB — MAGNESIUM: Magnesium: 1.6 mg/dL — ABNORMAL LOW (ref 1.7–2.4)

## 2016-08-26 LAB — GLUCOSE, CAPILLARY
GLUCOSE-CAPILLARY: 93 mg/dL (ref 65–99)
GLUCOSE-CAPILLARY: 119 mg/dL — AB (ref 65–99)
GLUCOSE-CAPILLARY: 112 mg/dL — AB (ref 65–99)
Glucose-Capillary: 132 mg/dL — ABNORMAL HIGH (ref 65–99)

## 2016-08-26 NOTE — Progress Notes (Signed)
Referring Physician(s): Alekh,K  Supervising Physician: Arne Cleveland  Patient Status:  [facility] - In-pt  Chief Complaint:  Recurrent left pleural effusion, metastatic renal cell cancer  Subjective:  Pt eating breakfast; states breathing improved since pleurx placed yesterday; has some chest discomfort with deep breaths but no acute changes  Allergies: Codeine  Medications: Prior to Admission medications   Medication Sig Start Date End Date Taking? Authorizing Provider  acetaminophen (TYLENOL) 325 MG tablet Take 2 tablets (650 mg total) by mouth every 6 (six) hours as needed for mild pain (or Fever >/= 101). 04/27/16  Yes Perkins, Alexzandrew L, PA-C  apixaban (ELIQUIS) 5 MG TABS tablet Take 1 tablet (5 mg total) by mouth 2 (two) times daily. 06/03/16  Yes Everrett Coombe, MD  aspirin EC 81 MG tablet Take 81 mg by mouth daily.   Yes [provider]  BYETTA 10 MCG PEN 10 MCG/0.04ML SOPN injection Inject 10 mg into the skin 2 (two) times daily. 05/13/15  Yes [provider]  fluticasone (FLONASE) 50 MCG/ACT nasal spray Place 2 sprays into the nose daily. Patient taking differently: Place 2 sprays into the nose daily as needed for allergies.  11/26/12  Yes Robyn Haber, MD  furosemide (LASIX) 20 MG tablet Take 20 mg by mouth daily as needed for fluid or edema.  10/23/14  Yes [provider]  hydrochlorothiazide (MICROZIDE) 12.5 MG capsule Take 1 capsule (12.5 mg total) by mouth daily. 05/28/16  Yes Everrett Coombe, MD  insulin glargine (LANTUS) 100 UNIT/ML injection Inject 30 Units into the skin at bedtime.    Yes [provider]  loperamide (IMODIUM) 2 MG capsule Take 2 mg by mouth as needed for diarrhea or loose stools (side effects of Votrient.).   Yes [provider]  losartan (COZAAR) 50 MG tablet Take 1 tablet (50 mg total) by mouth daily. 05/28/16  Yes Everrett Coombe, MD  methocarbamol (ROBAXIN) 500 MG tablet Take 1 tablet (500 mg total) by mouth  every 6 (six) hours as needed for muscle spasms. 04/27/16  Yes Perkins, Alexzandrew L, PA-C  mirtazapine (REMERON) 15 MG tablet Take 1 tablet (15 mg total) by mouth at bedtime. 07/28/16  Yes Wyatt Portela, MD  pantoprazole (PROTONIX) 40 MG tablet Take 1 tablet (40 mg total) by mouth daily. Patient taking differently: Take 40 mg by mouth every other day.  06/16/16  Yes Esterwood, Amy S, PA-C  pazopanib (VOTRIENT) 200 MG tablet Take 3 tablets (600 mg total) by mouth daily. Take on an empty stomach. 08/14/16  Yes Wyatt Portela, MD  prochlorperazine (COMPAZINE) 10 MG tablet Take 1 tablet (10 mg total) by mouth every 6 (six) hours as needed for nausea or vomiting. 08/14/16  Yes Wyatt Portela, MD  rosuvastatin (CRESTOR) 10 MG tablet Take 10 mg by mouth at bedtime.  11/18/15  Yes [provider]  traMADol (ULTRAM) 50 MG tablet Take 1-2 tablets (50-100 mg total) by mouth every 6 (six) hours as needed (mild pain). 04/27/16  Yes Perkins, Alexzandrew L, PA-C     Vital Signs: BP 102/62 (BP Location: Right Arm)   Pulse (!) 101   Temp 98.6 F (37 C) (Oral)   Resp 20   Ht 5\' 8"  (1.727 m)   Wt 199 lb 8.3 oz (90.5 kg)   SpO2 94%   BMI 30.34 kg/m   Physical Exam left pleurx cath intact, dressing with small amt old blood on gauze, site mildly tender   Imaging: Ir Guided  Drain W Catheter Placement  Result Date: 08/25/2016 INDICATION: Recurrent malignant left pleural effusion EXAM: ULTRASOUND AND FLUOROSCOPIC LEFT TUNNELED PLEURAL DRAIN (PLEURX CATHETER) MEDICATIONS: Ancef 2 g, administered within 1 hour of the procedure ANESTHESIA/SEDATION: Fentanyl 100 mcg IV; Versed 2.0 mg IV Moderate Sedation Time:  20 minutes The patient was continuously monitored during the procedure by the interventional radiology nurse under my direct supervision. COMPLICATIONS: None immediate. PROCEDURE: Informed written consent was obtained from the patient after a thorough discussion of the procedural risks, benefits and  alternatives. All questions were addressed. Maximal Sterile Barrier Technique was utilized including caps, mask, sterile gowns, sterile gloves, sterile drape, hand hygiene and skin antiseptic. A timeout was performed prior to the initiation of the procedure. Previous imaging reviewed. Right lower chest marked in the mid axillary line through a lower intercostal space. Under sterile conditions and local anesthesia, ultrasound percutaneous access performed of the left pleural space. Needle position confirmed with ultrasound. Images obtained for documentation. There was return of bloody pleural fluid. Amplatz guidewire inserted. In the adjacent soft tissues, a subcutaneous tunnel was created. The PleurX catheter was tunneled subcutaneously to the puncture site and advanced into the left chest through the valved pleural sheath. Position confirmed with fluoroscopy. Thoracentesis performed yielding 5 minutes 50 cc bloody pleural fluid. Catheter secured with a Prolene suture. Entry site closed with subcuticular Vicryl suture and derma bond. Sterile dressing applied. No immediate complication. Patient tolerated the procedure well. IMPRESSION: Successful ultrasound and fluoroscopic 15.5 French left tunneled pleural drain (PleurX catheter). 550 cc left thoracentesis performed after insertion. Electronically Signed   By: Jerilynn Mages.  Shick M.D.   On: 08/25/2016 10:44    Labs:  CBC:  Recent Labs  08/23/16 0650 08/24/16 0640 08/25/16 0609 08/26/16 0701  WBC 10.8* 10.8* 12.2* 11.2*  HGB 7.9* 8.2* 8.3* 7.8*  HCT 25.2* 26.3* 27.3* 25.6*  PLT 466* 463* 481* 420*    COAGS:  Recent Labs  04/17/16 1408  08/14/16 2336 08/15/16 0201 08/15/16 1018 08/15/16 2230 08/21/16 1312 08/25/16 0609  INR 0.95  < > 1.96 1.83  --   --  1.59 1.49  APTT 33  --  50*  --  145* 142*  --   --   < > = values in this interval not displayed.  BMP:  Recent Labs  08/23/16 0650 08/24/16 0640 08/25/16 0609 08/26/16 0701  NA 139 138  139 142  K 5.4* 4.9 4.3 3.9  CL 114* 113* 113* 115*  CO2 18* 17* 17* 19*  GLUCOSE 79 115* 94 104*  BUN 15 15 15 15   CALCIUM 8.0* 8.1* 8.1* 8.1*  CREATININE 1.40* 1.34* 1.39* 1.56*  GFRNONAA 38* 40* 38* 33*  GFRAA 44* 46* 44* 39*    LIVER FUNCTION TESTS:  Recent Labs  07/27/16 1308 07/28/16 1244 08/14/16 1635 08/19/16 1016 08/22/16 0613  BILITOT 0.4 0.54 0.7  --  0.5  AST 11 10 18   --  22  ALT 5 <6 9*  --  11*  ALKPHOS 122* 111 92  --  94  PROT 6.4 6.6 7.4 5.2* 5.4*  ALBUMIN 3.2* 2.4* 2.7*  --  1.6*    Assessment and Plan: Patient with history of metastatic renal cell carcinoma with recurrent, symptomatic, most likely malignant left pleural effusion; last thoracentesis by IR done 08/15/16 yielded 2 L of bloody fluid; s/p left pleurx cath placement 5/29 with 550 cc removed; drain effusion as needed depending upon pt's symptoms; other plans as per primary team.  Electronically Signed: D. Rowe Robert, PA-C 08/26/2016, 10:46 AM   I spent a total of 15 minutes  at the the patient's bedside AND on the patient's hospital floor or unit, greater than 50% of which was counseling/coordinating care for left pleurx catheter     Patient ID: Laura Neal, female   DOB: 10-05-1950, 66 y.o.   MRN: [id number]

## 2016-08-26 NOTE — Progress Notes (Signed)
Physical Therapy Treatment Patient Details Name: Laura Neal MRN: [id number] DOB: [birth year] Today's Date: 08/26/2016    History of Present Illness  66 year old female w/ h/o RCC (on pazopanib) w/ known mets to left lung and adrenals (had stable disease as of March 2018), DMII, R THA 04/22/16, GI bleed, and admitted 5/18 w/ cc: ~ 1 week h/o N/V and abd discomfort. In ER found to be dehydrated, renal fxn had decline and found to have large to mod sized left effusion. S/P left thoracentesis on 5/19. IR guided Pleurx catheter placed on 08/25/2016    PT Comments    Pt ambulated short distance in hallway and reports SOB improved however still fatigues quickly.  Follow Up Recommendations  Home health PT;Supervision for mobility/OOB     Equipment Recommendations  None recommended by PT    Recommendations for Other Services       Precautions / Restrictions Precautions Precautions: Fall    Mobility  Bed Mobility Overal bed mobility: Needs Assistance Bed Mobility: Sit to Supine;Supine to Sit     Supine to sit: Supervision Sit to supine: Min assist   General bed mobility comments: increased time, assist for LEs onto bed due to L flank pain  Transfers Overall transfer level: Needs assistance Equipment used: Rolling walker (2 wheeled) Transfers: Sit to/from Stand Sit to Stand: Min guard         General transfer comment: min/guard for safety  Ambulation/Gait Ambulation/Gait assistance: Min guard Ambulation Distance (Feet): 40 Feet Assistive device: Rolling walker (2 wheeled) Gait Pattern/deviations: Step-through pattern;Decreased stance time - right;Antalgic Gait velocity: decreased   General Gait Details: reports less SOB today however distance limited by fatigue and sore L flank     Stairs            Wheelchair Mobility    Modified Rankin (Stroke Patients Only)       Balance                                            Cognition  Arousal/Alertness: Awake/alert Behavior During Therapy: WFL for tasks assessed/performed Overall Cognitive Status: Within Functional Limits for tasks assessed                                        Exercises      General Comments        Pertinent Vitals/Pain Pain Assessment: 0-10 Pain Score: 4  Pain Location: L flank Pain Descriptors / Indicators: Grimacing;Sore Pain Intervention(s): Limited activity within patient's tolerance;Monitored during session;Repositioned    Home Living                      Prior Function            PT Goals (current goals can now be found in the care plan section) Progress towards PT goals: Progressing toward goals    Frequency    Min 3X/week      PT Plan Current plan remains appropriate    Co-evaluation              AM-PAC PT "6 Clicks" Daily Activity  Outcome Measure  Difficulty turning over in bed (including adjusting bedclothes, sheets and blankets)?: None Difficulty moving from lying on back to sitting on the side of  the bed? : A Little Difficulty sitting down on and standing up from a chair with arms (e.g., wheelchair, bedside commode, etc,.)?: A Little Help needed moving to and from a bed to chair (including a wheelchair)?: A Little Help needed walking in hospital room?: A Little Help needed climbing 3-5 steps with a railing? : A Little 6 Click Score: 19    End of Session   Activity Tolerance: Patient limited by fatigue Patient left: with call bell/phone within reach;in bed;with family/visitor present   PT Visit Diagnosis: Other abnormalities of gait and mobility (R26.89)     Time: 6578-4696 PT Time Calculation (min) (ACUTE ONLY): 15 min  Charges:  $Gait Training: 8-22 mins                    G Codes:       Carmelia Bake, PT, DPT 08/26/2016 Pager: [phone]    York Ram E 08/26/2016, 4:03 PM

## 2016-08-26 NOTE — Progress Notes (Signed)
Family members that will be taking care of patient, are unavailable to meet today to go over pleurex catheter education.  Time set up at 3 pm tomorrow to be able to educate the 3 family members that will be the active caregivers.

## 2016-08-26 NOTE — Progress Notes (Signed)
PROGRESS NOTE    Laura Neal  OAC:[id number] DOB: 1951-02-26 DOA: 08/14/2016 PCP: Bernerd Limbo, MD   Brief Narrative: Laura Neal is a 66 y.o. 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 Patient reported symptoms to her oncologist who recommended to stop her medications and report to the emergency department. In the ED she was found to be dehydrated with creatinine of 2.9 BUN of 34. Chest x-ray was done which showed moderate to large pleural effusion worsened from prior studies and a lactic acid level of 2.24. Patient was admitted for IV hydration and further evaluation. Patient had thoracentesison 08/15/16 which yielded 2 L of bloody fluids. Subsequently Hgb dropped and effusion worsened. CCM consulted. Pleural fluid cytology was suspicious for malignant cells. She had a repeat thoracentesis done at bedside on 05/23/2018at bedside by PCCM, which was negative for malignant cells. PCCM recommended cardiothoracic surgery evaluation for needs for possible chest tube versus Pleurx catheter versus VATS. Cardiothoracic surgery was consulted on phone, Dr. Servando Snare reviewed the images and recommended interventional radiology guided Pleurx catheter placement. Patient required repeat ultrasound-guided thoracentesis on 08/21/2016 and 1.5 L of fluid was drained. Patient had IR guided Pleurx catheter placedon 08/25/2016.   Assessment & Plan:   Principal Problem:   AKI (acute kidney injury) ([place]) Active Problems:   Type 2 diabetes mellitus with stage 3 chronic kidney disease, without long-term current use of insulin (HCC)   Pulmonary embolus (HCC)   Malignant neoplasm metastatic to left lung (HCC)   Moderate protein-calorie malnutrition (HCC)   Pleural effusion on left   Acute kidney injury ([place])   Pressure injury of skin  Pleural effusion   Atelectasis   Hypoxemia   Recurrently left pleural effusion Likely secondary to metastatic renal cell carcinoma to the lungs. Initial pleural fluid cytology concerning for malignant cells with repeat cytology negative for malignant cells. Pleurx cath placed on 5/29 by interventional radiology.  Type 2 diabetes mellitus -Continue Lantus -Continue SSI  History of DVT/PE Patient previously on Eliquis which was discontinued secondary to recurrent hemothorax. Plans for patient's oncologist to reevaluate for restarting anticoagulation.  Moderate protein calorie malnutrition Secondary to chronic disease. -continue nutrition supplementation  Renal cell carcinoma with lung metastasis Patient follows up with Dr. Alen Blew as an outpatient  Leukocytosis Stable. Likely reactive secondary to underlying malignancy.  Thrombocytosis Stable. Likely reactive secondary to underlying malignancy  Stage II sacral decubitus ulcer Present on admission. -continue wound care  Acute kidney injury on CKD III Creatinine worsening again likely secondary to lasix -discontinue lasix    DVT prophylaxis: SCDs Code Status: Full code Family Communication: None at bedside Disposition Plan: Discharge likely in 24 hours   Consultants:   Interventional radiology  PCCM  Procedures:   Ultrasound-guided thoracentesis 08/15/2016.Bedside thoracentesis on 08/19/2016  Repeat ultrasound-guided thoracentesis on 08/21/2016  IR Pleurx catheter placement on 08/25/2016  Antimicrobials:  None    Subjective: Patient reports some dyspnea with exertion. Otherwise, she has some mild coughing since the catheter placement.  Objective: Vitals:   08/25/16 1301 08/25/16 2124 08/26/16 0540 08/26/16 0834  BP: (!) 106/57 111/65 102/66 102/62  Pulse: 96 (!) 103 (!) 105 (!) 101  Resp:  20 14 20   Temp: 97.4 F (36.3 C) 98.7 F (37.1 C) 98.6 F (37 C)   TempSrc: Oral Oral Oral   SpO2: 95% 95%  93% 94%  Weight:      Height:        Intake/Output Summary (Last 24 hours) at 08/26/16 1157 Last data filed at 08/25/16 1802  Gross per 24 hour  Intake              300 ml  Output              600 ml  Net             -300 ml   Filed Weights   08/14/16 2334 08/16/16 2200 08/19/16 0400  Weight: 82.6 kg (182 lb 1.6 oz) 89 kg (196 lb 3.4 oz) 90.5 kg (199 lb 8.3 oz)    Examination:  General exam: Appears calm and comfortable Respiratory system:  Could not hear any appreciable breath sounds in lower left lobe. Respiratory effort normal. Cardiovascular system: S1 & S2 heard, RRR. No murmurs, rubs, gallops or clicks. Gastrointestinal system: Abdomen is nondistended, soft and nontender. No organomegaly or masses felt. Normal bowel sounds heard. Central nervous system: Alert and oriented. No focal neurological deficits. Extremities: No edema. No calf tenderness Skin: No cyanosis. No rashes Psychiatry: Judgement and insight appear normal. Mood & affect appropriate.     Data Reviewed: I have personally reviewed following labs and imaging studies  CBC:  Recent Labs Lab 08/22/16 0613 08/23/16 0650 08/24/16 0640 08/25/16 0609 08/26/16 0701  WBC 10.3 10.8* 10.8* 12.2* 11.2*  NEUTROABS 7.2 7.5 7.8* 8.9* 8.3*  HGB 8.1* 7.9* 8.2* 8.3* 7.8*  HCT 26.1* 25.2* 26.3* 27.3* 25.6*  MCV 93.9 94.4 94.9 95.5 94.5  PLT 475* 466* 463* 481* 025*   Basic Metabolic Panel:  Recent Labs Lab 08/20/16 0256 08/21/16 0607 08/22/16 0613 08/23/16 0650 08/24/16 0640 08/25/16 0609 08/26/16 0701  NA 137 137 139 139 138 139 142  K 4.4 4.7 4.7 5.4* 4.9 4.3 3.9  CL 111 112* 113* 114* 113* 113* 115*  CO2 18* 13* 20* 18* 17* 17* 19*  GLUCOSE 87 83 104* 79 115* 94 104*  BUN 13 14 16 15 15 15 15   CREATININE 1.27* 1.40* 1.44* 1.40* 1.34* 1.39* 1.56*  CALCIUM 8.3* 8.4* 8.2* 8.0* 8.1* 8.1* 8.1*  MG 1.8 1.7 1.8 1.7  --   --  1.6*   GFR: Estimated Creatinine Clearance: 41.2 mL/min (A) (by C-G formula  based on SCr of 1.56 mg/dL (H)). Liver Function Tests:  Recent Labs Lab 08/22/16 0613  AST 22  ALT 11*  ALKPHOS 94  BILITOT 0.5  PROT 5.4*  ALBUMIN 1.6*    Recent Labs Lab 08/22/16 0613  LIPASE 21   No results for input(s): AMMONIA in the last 168 hours. Coagulation Profile:  Recent Labs Lab 08/21/16 1312 08/25/16 0609  INR 1.59 1.49   Cardiac Enzymes: No results for input(s): CKTOTAL, CKMB, CKMBINDEX, TROPONINI in the last 168 hours. BNP (last 3 results) No results for input(s): PROBNP in the last 8760 hours. HbA1C: No results for input(s): HGBA1C in the last 72 hours. CBG:  Recent Labs Lab 08/25/16 0749 08/25/16 1215 08/25/16 1606 08/25/16 2122 08/26/16 0731  GLUCAP 92 164* 146* 131* 93   Lipid Profile: No results for input(s): CHOL, HDL, LDLCALC, TRIG, CHOLHDL, LDLDIRECT in the last 72 hours. Thyroid Function Tests: No results for input(s): TSH, T4TOTAL, FREET4, T3FREE, THYROIDAB in the last 72 hours. Anemia Panel: No results for input(s): VITAMINB12, FOLATE, FERRITIN, TIBC, IRON, RETICCTPCT in the last 72 hours. Sepsis Labs: No results for input(s): PROCALCITON, LATICACIDVEN in the last  168 hours.  Recent Results (from the past 240 hour(s))  MRSA PCR Screening     Status: None   Collection Time: 08/16/16 10:33 PM  Result Value Ref Range Status   MRSA by PCR NEGATIVE NEGATIVE Final    Comment:        The GeneXpert MRSA Assay (FDA approved for NASAL specimens only), is one component of a comprehensive MRSA colonization surveillance program. It is not intended to diagnose MRSA infection nor to guide or monitor treatment for MRSA infections.          Radiology Studies: Ir Lenise Arena W Catheter Placement  Result Date: 08/25/2016 INDICATION: Recurrent malignant left pleural effusion EXAM: ULTRASOUND AND FLUOROSCOPIC LEFT TUNNELED PLEURAL DRAIN (PLEURX CATHETER) MEDICATIONS: Ancef 2 g, administered within 1 hour of the procedure  ANESTHESIA/SEDATION: Fentanyl 100 mcg IV; Versed 2.0 mg IV Moderate Sedation Time:  20 minutes The patient was continuously monitored during the procedure by the interventional radiology nurse under my direct supervision. COMPLICATIONS: None immediate. PROCEDURE: Informed written consent was obtained from the patient after a thorough discussion of the procedural risks, benefits and alternatives. All questions were addressed. Maximal Sterile Barrier Technique was utilized including caps, mask, sterile gowns, sterile gloves, sterile drape, hand hygiene and skin antiseptic. A timeout was performed prior to the initiation of the procedure. Previous imaging reviewed. Right lower chest marked in the mid axillary line through a lower intercostal space. Under sterile conditions and local anesthesia, ultrasound percutaneous access performed of the left pleural space. Needle position confirmed with ultrasound. Images obtained for documentation. There was return of bloody pleural fluid. Amplatz guidewire inserted. In the adjacent soft tissues, a subcutaneous tunnel was created. The PleurX catheter was tunneled subcutaneously to the puncture site and advanced into the left chest through the valved pleural sheath. Position confirmed with fluoroscopy. Thoracentesis performed yielding 5 minutes 50 cc bloody pleural fluid. Catheter secured with a Prolene suture. Entry site closed with subcuticular Vicryl suture and derma bond. Sterile dressing applied. No immediate complication. Patient tolerated the procedure well. IMPRESSION: Successful ultrasound and fluoroscopic 15.5 French left tunneled pleural drain (PleurX catheter). 550 cc left thoracentesis performed after insertion. Electronically Signed   By: Jerilynn Mages.  Shick M.D.   On: 08/25/2016 10:44        Scheduled Meds: . feeding supplement  1 Container Oral TID BM  . insulin aspart  0-9 Units Subcutaneous TID WC  . insulin glargine  10 Units Subcutaneous Daily  . mouth rinse   15 mL Mouth Rinse BID  . mirtazapine  15 mg Oral QHS  . pantoprazole  40 mg Oral QODAY  . rosuvastatin  10 mg Oral QHS  . sodium chloride flush  3 mL Intravenous Q12H   Continuous Infusions:   LOS: 12 days     Cordelia Poche, MD [facility] 08/26/2016, 11:57 AM Pager: [phone]  If 7PM-7AM, please contact night-coverage [email or web address] Password [email or web address] 08/26/2016, 11:57 AM

## 2016-08-27 DIAGNOSIS — Z9889 Other specified postprocedural states: Secondary | ICD-10-CM

## 2016-08-27 LAB — BASIC METABOLIC PANEL
Anion gap: 8 (ref 5–15)
BUN: 14 mg/dL (ref 6–20)
CALCIUM: 8.1 mg/dL — AB (ref 8.9–10.3)
CO2: 20 mmol/L — ABNORMAL LOW (ref 22–32)
CREATININE: 1.47 mg/dL — AB (ref 0.44–1.00)
Chloride: 112 mmol/L — ABNORMAL HIGH (ref 101–111)
GFR calc Af Amer: 41 mL/min — ABNORMAL LOW (ref >60)
GFR, EST NON AFRICAN AMERICAN: 36 mL/min — AB (ref >60)
GLUCOSE: 116 mg/dL — AB (ref 65–99)
POTASSIUM: 3.4 mmol/L — AB (ref 3.5–5.1)
SODIUM: 140 mmol/L (ref 135–145)

## 2016-08-27 LAB — GLUCOSE, CAPILLARY
Glucose-Capillary: 111 mg/dL — ABNORMAL HIGH (ref 65–99)
Glucose-Capillary: 124 mg/dL — ABNORMAL HIGH (ref 65–99)
Glucose-Capillary: 113 mg/dL — ABNORMAL HIGH (ref 65–99)

## 2016-08-27 MED ORDER — BYETTA 10 MCG PEN 10 MCG/0.04ML ~~LOC~~ SOPN: 10.0000 ug | PEN_INJECTOR | Freq: Two times a day (BID) | SUBCUTANEOUS | Status: DC

## 2016-08-27 MED ORDER — INSULIN GLARGINE 100 UNIT/ML ~~LOC~~ SOLN: 15.0000 [IU] | Freq: Every day | SUBCUTANEOUS | Status: AC

## 2016-08-27 MED ORDER — BOOST / RESOURCE BREEZE PO LIQD: 1.0000 | Freq: Three times a day (TID) | ORAL | 0 refills | Status: DC

## 2016-08-27 NOTE — Progress Notes (Signed)
Patient given discharge instructions, and verbalized an understanding of all discharge instructions.  Patient's pleurex drained, and family educated on how to drain, and change dressing.  150 cc bloody drainage emptied.  Patient agrees with discharge plan, and is being discharged in stable medical condition.  Patient given transportation via wheelchair.

## 2016-08-27 NOTE — Discharge Summary (Signed)
Physician Discharge Summary  Laura Neal MLY:[id number] DOB: 1950-10-27 DOA: 08/14/2016  PCP: Bernerd Limbo, MD  Admit date: 08/14/2016 Discharge date: 08/27/2016  Admitted From: Home Disposition: Home  Recommendations for Outpatient Follow-up:  1. Follow up with PCP in 1 week 2. Follow up with oncologist in 1 week  Home Health: Physical therapy, nursing Equipment/Devices: Pleurx catheter  Discharge Condition: Guarded CODE STATUS: Full code Diet recommendation: Carb modified   Brief/Interim Summary:  Laura Neal is a 66 y.o. 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 Patient reported symptoms to her oncologist who recommended to stop her medications and report to the emergency department. In the ED she was found to be dehydrated with creatinine of 2.9 BUN of 34. Chest x-ray was done which showed moderate to large pleural effusion worsened from prior studies and a lactic acid level of 2.24. Patient was admitted for IV hydration and further evaluation. Patient had thoracentesison 08/15/16 which yielded 2 L of bloody fluids. Subsequently Hgb dropped and effusion worsened. CCM consulted. Pleural fluid cytology was suspicious for malignant cells. She had a repeat thoracentesis done at bedside on 05/23/2018at bedside by PCCM, which was negative for malignant cells. PCCM recommended cardiothoracic surgery evaluation for needs for possible chest tube versus Pleurx catheter versus VATS. Cardiothoracic surgery was consulted on phone, Dr. Servando Snare reviewed the images and recommended interventional radiology guided Pleurx catheter placement. Patient required repeat ultrasound-guided thoracentesis on 08/21/2016 and 1.5 L of fluid was drained. Patient hadIR guided Pleurx catheter placedon 08/25/2016.   Hospital  course:  Recurrently left pleural effusion Likely secondary to metastatic renal cell carcinoma to the lungs. Initial pleural fluid cytology concerning for malignant cells with repeat cytology negative for malignant cells. Pleurx cath placed on 5/29 by interventional radiology. Patient to drain as needed for symptoms. Would benefit from palliative care referral as an outpatient per oncologist.  Type 2 diabetes mellitus Lantus and sliding scale insulin.  History of DVT/PE Patient previously on Eliquis which was discontinued secondary to recurrent hemothorax. Plans for patient's oncologist to reevaluate for restarting anticoagulation.  Moderate protein calorie malnutrition Secondary to chronic disease. Nutrition supplementation.  Renal cell carcinoma with lung metastasis Patient follows up with Dr. Alen Blew as an outpatient. Votrient stopped on admission. To be considered for restart by her oncologist.  Leukocytosis Stable. Likely reactive secondary to underlying malignancy.  Thrombocytosis Stable. Likely reactive secondary to underlying malignancy.  Stage II sacral decubitus ulcer Present on admission. Wound care  Acute kidney injury on CKD III Creatinine worsened likely secondary to intravascular depletion in setting of lasix diuresis. Lasix discontinued and creatinine remained stable.   Discharge Diagnoses:  Principal Problem:   AKI (acute kidney injury) ([place]) Active Problems:   Type 2 diabetes mellitus with stage 3 chronic kidney disease, without long-term current use of insulin (HCC)   Pulmonary embolus (HCC)   Malignant neoplasm metastatic to left lung (HCC)   Moderate protein-calorie malnutrition (HCC)   Pleural effusion on left   Acute kidney injury ([place])   Pressure injury of skin   Pleural effusion   Atelectasis   Hypoxemia    Discharge Instructions  Discharge Instructions    Diet - low sodium heart healthy    Complete by:  As directed    Increase  activity slowly    Complete by:  As directed  Allergies as of 08/27/2016      Reactions   Codeine Rash      Medication List    STOP taking these medications   apixaban 5 MG Tabs tablet Commonly known as:  ELIQUIS   hydrochlorothiazide 12.5 MG capsule Commonly known as:  MICROZIDE   loperamide 2 MG capsule Commonly known as:  IMODIUM   losartan 50 MG tablet Commonly known as:  COZAAR   pazopanib 200 MG tablet Commonly known as:  VOTRIENT     TAKE these medications   acetaminophen 325 MG tablet Commonly known as:  TYLENOL Take 2 tablets (650 mg total) by mouth every 6 (six) hours as needed for mild pain (or Fever >/= 101).   aspirin EC 81 MG tablet Take 81 mg by mouth daily.   BYETTA 10 MCG PEN 10 MCG/0.04ML Sopn injection Generic drug:  exenatide Inject 0.04 mLs (10 mcg total) into the skin 2 (two) times daily. What changed:  how much to take   feeding supplement Liqd Take 1 Container by mouth 3 (three) times daily between meals.   fluticasone 50 MCG/ACT nasal spray Commonly known as:  FLONASE Place 2 sprays into the nose daily. What changed:  when to take this  reasons to take this   furosemide 20 MG tablet Commonly known as:  LASIX Take 20 mg by mouth daily as needed for fluid or edema.   insulin glargine 100 UNIT/ML injection Commonly known as:  LANTUS Inject 0.15 mLs (15 Units total) into the skin daily. Start taking on:  08/28/2016 What changed:  how much to take  when to take this   methocarbamol 500 MG tablet Commonly known as:  ROBAXIN Take 1 tablet (500 mg total) by mouth every 6 (six) hours as needed for muscle spasms.   mirtazapine 15 MG tablet Commonly known as:  REMERON Take 1 tablet (15 mg total) by mouth at bedtime.   pantoprazole 40 MG tablet Commonly known as:  PROTONIX Take 1 tablet (40 mg total) by mouth daily. What changed:  when to take this   prochlorperazine 10 MG tablet Commonly known as:  COMPAZINE Take 1  tablet (10 mg total) by mouth every 6 (six) hours as needed for nausea or vomiting.   rosuvastatin 10 MG tablet Commonly known as:  CRESTOR Take 10 mg by mouth at bedtime.   traMADol 50 MG tablet Commonly known as:  ULTRAM Take 1-2 tablets (50-100 mg total) by mouth every 6 (six) hours as needed (mild pain).      Follow-up Information    Bernerd Limbo, MD. Schedule an appointment as soon as possible for a visit in 1 week(s).   Specialty:  Family Medicine Contact information: [address] [place] [address] [phone]        Wyatt Portela, MD. Schedule an appointment as soon as possible for a visit in 1 week(s).   Specialty:  Oncology Contact information: [address] [place] [address] [phone]          Allergies  Allergen Reactions  . Codeine Rash    Consultations:  PCCM  Cardiothoracic surgery  Interventional radiology   Procedures/Studies: Dg Chest 1 View  Result Date: 08/15/2016 CLINICAL DATA:  Status post left thoracentesis. EXAM: CHEST 1 VIEW COMPARISON:  Aug 14, 2016 FINDINGS: The left-sided pleural effusion remains but is smaller. No pneumothorax. No other changes. IMPRESSION: Decreased size of left-sided pleural effusion after thoracentesis. No pneumothorax. Electronically Signed   By: [name]  Jimmye Norman III M.D   On: 08/15/2016 14:09   Dg Chest 2 View  Result Date: 08/14/2016 CLINICAL DATA:  PT c/o SOB upon exertion recently, as well as decrease in appetite. Pt was taken off of chemo pills today due to health reasons. Pt has cancer of right kidney. H/o PNA, Pulmonary Embolism, Type II Diabetes. Former smoker. EXAM: CHEST  2 VIEW COMPARISON:  05/26/2016 FINDINGS: Moderate to large pleural effusion obscures hemidiaphragm and left heart border, significantly increased from the prior exam. No right pleural effusion. No evidence of pneumonia or pulmonary edema. Cardiac silhouette is partly obscured but grossly normal in size.  No mediastinal or hilar masses. No convincing adenopathy. No pneumothorax. Skeletal structures are intact. IMPRESSION: 1. Moderate to large left pleural effusion. This has significantly increased in size from the prior study. 2. No right pleural effusion. No evidence of pneumonia or pulmonary edema. Electronically Signed   By: Lajean Manes M.D.   On: 08/14/2016 18:05   Ct Chest Wo Contrast  Result Date: 08/16/2016 CLINICAL DATA:  Drop in hemoglobin post thoracentesis. EXAM: CT CHEST WITHOUT CONTRAST TECHNIQUE: Multidetector CT imaging of the chest was performed following the standard protocol without IV contrast. COMPARISON:  06/17/2016 FINDINGS: Cardiovascular: Extensive aortic and coronary atheromatous calcifications. No aneurysm. Small pericardial effusion. Mediastinum/Nodes: 18 mm enlarging left suprahilar node, previously 14 mm. 1 cm pretracheal node, previously 7 mm. No axillary adenopathy. Lungs/Pleura: No pneumothorax. Small layering right pleural effusion. Moderately large hyperdense residual/recurrent left pleural effusion probably hemorrhagic. compressive atelectasis throughout the basilar segments of left lower lobe and inferior lingula. Bilateral pulmonary nodules with slight progression. Index right lower lobe nodule 19 mm image 99/5, previously 17 mm. Posterior 13 mm left upper lobe nodule image 50/5, previously 11 mm by my measurement. Additional smaller nodules bilaterally, several cavitary. Upper Abdomen: Previous right nephrectomy. Mixed attenuation left renal mass, incompletely visualized. Progressive inflammatory/edematous changes around pancreatic head and body, incompletely seen. Stable left adrenal enlargement. Musculoskeletal: Negative for fracture. IMPRESSION: 1. Moderately large  residual/recurrent hemorrhagic left effusion. 2. Progression of bilateral pulmonary nodules and mediastinal adenopathy. 3. Small pericardial and right pleural effusions. 4. New peripancreatic  inflammatory/edematous changes suggesting pancreatitis. 5. Aortic and coronary atherosclerosis. 6. Left renal and left adrenal masses again noted. Electronically Signed   By: Lucrezia Europe M.D.   On: 08/16/2016 10:46   Ir Guided Niel Hummer W Catheter Placement  Result Date: 08/25/2016 INDICATION: Recurrent malignant left pleural effusion EXAM: ULTRASOUND AND FLUOROSCOPIC LEFT TUNNELED PLEURAL DRAIN (PLEURX CATHETER) MEDICATIONS: Ancef 2 g, administered within 1 hour of the procedure ANESTHESIA/SEDATION: Fentanyl 100 mcg IV; Versed 2.0 mg IV Moderate Sedation Time:  20 minutes The patient was continuously monitored during the procedure by the interventional radiology nurse under my direct supervision. COMPLICATIONS: None immediate. PROCEDURE: Informed written consent was obtained from the patient after a thorough discussion of the procedural risks, benefits and alternatives. All questions were addressed. Maximal Sterile Barrier Technique was utilized including caps, mask, sterile gowns, sterile gloves, sterile drape, hand hygiene and skin antiseptic. A timeout was performed prior to the initiation of the procedure. Previous imaging reviewed. Right lower chest marked in the mid axillary line through a lower intercostal space. Under sterile conditions and local anesthesia, ultrasound percutaneous access performed of the left pleural space. Needle position confirmed with ultrasound. Images obtained for documentation. There was return of bloody pleural fluid. Amplatz guidewire inserted. In the adjacent soft tissues, a subcutaneous tunnel was created. The PleurX catheter was tunneled subcutaneously to the puncture  site and advanced into the left chest through the valved pleural sheath. Position confirmed with fluoroscopy. Thoracentesis performed yielding 5 minutes 50 cc bloody pleural fluid. Catheter secured with a Prolene suture. Entry site closed with subcuticular Vicryl suture and derma bond. Sterile dressing applied. No  immediate complication. Patient tolerated the procedure well. IMPRESSION: Successful ultrasound and fluoroscopic 15.5 French left tunneled pleural drain (PleurX catheter). 550 cc left thoracentesis performed after insertion. Electronically Signed   By: Jerilynn Mages.  Shick M.D.   On: 08/25/2016 10:44   Dg Chest Port 1 View  Result Date: 08/21/2016 CLINICAL DATA:  Post thoracentesis EXAM: PORTABLE CHEST 1 VIEW COMPARISON:  08/21/2016 FINDINGS: Decreased left pleural effusion with moderate residual. Improved aeration of left upper lobe. No definitive pneumothorax. Small right pleural effusion. Persistent bibasilar left greater than right atelectasis or pneumonia. Mild cardiomegaly. Aortic atherosclerosis. IMPRESSION: 1. Decreased pleural effusion on the left, no pneumothorax is seen. Moderate residual left pleural effusion. 2. Otherwise no change in small right pleural effusion and dense left greater than right bilateral lower lung consolidations which may reflect atelectasis or pneumonia Electronically Signed   By: Donavan Foil M.D.   On: 08/21/2016 14:43   Dg Chest Port 1 View  Result Date: 08/21/2016 CLINICAL DATA:  Dyspnea EXAM: PORTABLE CHEST 1 VIEW COMPARISON:  08/19/2016 chest radiograph. FINDINGS: Stable cardiomediastinal silhouette with mild cardiomegaly and aortic atherosclerosis. No pneumothorax. Small right pleural effusion appears slightly increased. Large left pleural effusion appears stable. Stable near complete opacification of the left lung with minimal preserved aeration in the left upper lobe. Worsening patchy right lung base opacity. IMPRESSION: 1. Stable large left pleural effusion. Increased small right pleural effusion . 2. Stable near complete left lung opacification with minimal preserved aeration in the left upper lobe. Worsening patchy right lung base opacity. Findings could represent atelectasis, with pneumonia or aspiration not excluded. 3. Stable cardiomegaly. 4. Aortic atherosclerosis.  Electronically Signed   By: Ilona Sorrel M.D.   On: 08/21/2016 11:53   Dg Chest Port 1 View  Result Date: 08/19/2016 CLINICAL DATA:  Post left thoracentesis EXAM: PORTABLE CHEST 1 VIEW COMPARISON:  08/19/2016 FINDINGS: Continued large left pleural effusion, slightly decreased since prior study with slight increase in aeration of the left lung. No pneumothorax. Small right pleural effusion with right lower lobe atelectasis or infiltrate is stable. IMPRESSION: Slight decreased size of the large left pleural effusion following thoracentesis. No pneumothorax. Otherwise no change. Electronically Signed   By: Rolm Baptise M.D.   On: 08/19/2016 11:46   Dg Chest Port 1 View  Result Date: 08/19/2016 CLINICAL DATA:  Pleural effusion. EXAM: PORTABLE CHEST 1 VIEW COMPARISON:  08/18/2016.  CT 08/16/2016. FINDINGS: Heart size stable. Persistent large left pleural effusion, unchanged from prior exam. Small right pleural effusion again noted unchanged. Basilar atelectasis. Pulmonary nodules present best identified by prior CT. No pneumothorax. IMPRESSION: 1. Stable chest with large left pleural effusion and small right pleural effusion. 2. Basilar atelectasis. Pulmonary nodules present best identified by prior CT of 05/ 20/2018 . Electronically Signed   By: Marcello Moores  Register   On: 08/19/2016 07:16   Dg Chest Port 1 View  Result Date: 08/18/2016 CLINICAL DATA:  Pleural effusion. EXAM: PORTABLE CHEST 1 VIEW COMPARISON:  08/17/2016.  CT 08/16/2016. FINDINGS: Heart size stable Persistent large left pleural effusion, unchanged from prior exam. Small right pleural effusion again noted without interim change . Bibasilar atelectasis. Pulmonary nodules best identified by prior CT of 08/16/2016 . No pneumothorax. IMPRESSION: 1. Persistent  large left pleural effusion. Small right pleural effusion. 2. Bibasilar atelectasis. Pulmonary nodules best identified by prior CT of 08/16/2016. Electronically Signed   By: Marcello Moores  Register    On: 08/18/2016 06:56   Dg Chest Port 1 View  Result Date: 08/17/2016 CLINICAL DATA:  66 year old female left-sided pleural effusion. Subsequent encounter. EXAM: PORTABLE CHEST 1 VIEW COMPARISON:  08/16/2016 CT and chest x-ray. FINDINGS: Large left-sided and small right-sided pleural effusion. Almost complete opacification left hemithorax. Left-sided pleural effusion has increased in size since prior exam. CT detected pulmonary nodules and adenopathy not as well delineated on present plain film exam. IMPRESSION: Increase in size of large left-sided pleural effusion. Almost complete opacification left hemithorax. Small right-sided pleural effusion. Electronically Signed   By: Genia Del M.D.   On: 08/17/2016 07:21   Dg Chest Port 1 View  Result Date: 08/16/2016 CLINICAL DATA:  Pleural effusion EXAM: PORTABLE CHEST 1 VIEW COMPARISON:  08/15/2016 FINDINGS: Moderate left pleural effusion again noted with left lower lobe atelectasis. Increasing left upper lobe airspace disease. Right basilar atelectasis or infiltrate. Mild cardiomegaly. IMPRESSION: Continued moderate left pleural effusion with left lower lobe atelectasis or infiltrate and right basilar opacity. Increasing left upper lobe airspace opacity. Electronically Signed   By: Rolm Baptise M.D.   On: 08/16/2016 08:43   US Thoracentesis Asp Pleural Space W/img Guide  Result Date: 08/21/2016 INDICATION: Metastatic renal cell carcinoma, dyspnea, recurrent left pleural effusion; request made for therapeutic left thoracentesis. EXAM: ULTRASOUND GUIDED THERAPEUTIC LEFT THORACENTESIS MEDICATIONS: None. COMPLICATIONS: None immediate. PROCEDURE: An ultrasound guided thoracentesis was thoroughly discussed with the patient and questions answered. The benefits, risks, alternatives and complications were also discussed. The patient understands and wishes to proceed with the procedure. Written consent was obtained. Ultrasound was performed to localize and mark an  adequate pocket of fluid in the left chest. The area was then prepped and draped in the normal sterile fashion. 1% Lidocaine was used for local anesthesia. Under ultrasound guidance a Safe-T-Centesis catheter was introduced. Thoracentesis was performed. The catheter was removed and a dressing applied. FINDINGS: A total of approximately 1.5 liters of bloody fluid was removed. Only the above amount of fluid was removed at this time secondary to persistent patient coughing and chest discomfort. IMPRESSION: Successful ultrasound guided therapeutic left thoracentesis yielding 1.5 liters of pleural fluid. Read by: Rowe Robert, PA-C Electronically Signed   By: Sandi Mariscal M.D.   On: 08/21/2016 14:43   US Thoracentesis Asp Pleural Space W/img Guide  Result Date: 08/15/2016 INDICATION: SOB upon exertion. Cancer of the right kidney. Left pleural effusion. EXAM: ULTRASOUND GUIDED LEFT THORACENTESIS MEDICATIONS: 1% Lidocaine = 10 mL. COMPLICATIONS: None immediate. PROCEDURE: An ultrasound guided thoracentesis was thoroughly discussed with the patient and questions answered. The benefits, risks, alternatives and complications were also discussed. The patient understands and wishes to proceed with the procedure. Written consent was obtained. Ultrasound was performed to localize and mark an adequate pocket of fluid in the left chest. The area was then prepped and draped in the normal sterile fashion. 1% Lidocaine was used for local anesthesia. Under ultrasound guidance a catheter was introduced. Thoracentesis was performed. The catheter was removed and a dressing applied. FINDINGS: A total of approximately 2 liters of bloody fluid was removed. Samples were sent to the laboratory as requested by the clinical team. IMPRESSION: Successful ultrasound guided left thoracentesis yielding 2 liters of pleural fluid. Post procedure radiograph shows no pneumothorax. Read by:  Gareth Eagle, PA-C Electronically Signed   By:  Lucrezia Europe M.D.    On: 08/15/2016 13:52     Subjective: Patient reports minimal dyspnea today. No chest pain.  Discharge Exam: Vitals:   08/27/16 0748 08/27/16 1353  BP: 110/68 113/63  Pulse: (!) 107 (!) 109  Resp: 18 18  Temp: 97.9 F (36.6 C) 98.6 F (37 C)   Vitals:   08/26/16 2009 08/27/16 0624 08/27/16 0748 08/27/16 1353  BP: (!) 101/59 103/64 110/68 113/63  Pulse: (!) 105 (!) 107 (!) 107 (!) 109  Resp: 18 18 18 18   Temp: 98.9 F (37.2 C) 97.9 F (36.6 C) 97.9 F (36.6 C) 98.6 F (37 C)  TempSrc: Oral Oral Oral Oral  SpO2: 96% 94% 93%   Weight:      Height:        General: Pt is alert, awake, not in acute distress Cardiovascular: RRR, S1/S2 +, no rubs, no gallops Respiratory: CTA on right side with no significant breath sounds in left lower lung areas Abdominal: Soft, NT, ND, bowel sounds + Extremities: no edema, no cyanosis    The results of significant diagnostics from this hospitalization (including imaging, microbiology, ancillary and laboratory) are listed below for reference.     Labs: BNP (last 3 results)  Recent Labs  05/26/16 1145  BNP 31.5   Basic Metabolic Panel:  Recent Labs Lab 08/21/16 0607 08/22/16 0613 08/23/16 0650 08/24/16 0640 08/25/16 0609 08/26/16 0701 08/27/16 0800  NA 137 139 139 138 139 142 140  K 4.7 4.7 5.4* 4.9 4.3 3.9 3.4*  CL 112* 113* 114* 113* 113* 115* 112*  CO2 13* 20* 18* 17* 17* 19* 20*  GLUCOSE 83 104* 79 115* 94 104* 116*  BUN 14 16 15 15 15 15 14   CREATININE 1.40* 1.44* 1.40* 1.34* 1.39* 1.56* 1.47*  CALCIUM 8.4* 8.2* 8.0* 8.1* 8.1* 8.1* 8.1*  MG 1.7 1.8 1.7  --   --  1.6*  --    Liver Function Tests:  Recent Labs Lab 08/22/16 0613  AST 22  ALT 11*  ALKPHOS 94  BILITOT 0.5  PROT 5.4*  ALBUMIN 1.6*    Recent Labs Lab 08/22/16 0613  LIPASE 21   CBC:  Recent Labs Lab 08/22/16 0613 08/23/16 0650 08/24/16 0640 08/25/16 0609 08/26/16 0701  WBC 10.3 10.8* 10.8* 12.2* 11.2*  NEUTROABS 7.2 7.5 7.8*  8.9* 8.3*  HGB 8.1* 7.9* 8.2* 8.3* 7.8*  HCT 26.1* 25.2* 26.3* 27.3* 25.6*  MCV 93.9 94.4 94.9 95.5 94.5  PLT 475* 466* 463* 481* 420*   CBG:  Recent Labs Lab 08/26/16 1156 08/26/16 1643 08/26/16 2143 08/27/16 0737 08/27/16 1156  GLUCAP 132* 119* 112* 111* 124*   Urinalysis    Component Value Date/Time   COLORURINE YELLOW 08/14/2016 2255   APPEARANCEUR CLEAR 08/14/2016 2255   LABSPEC 1.011 08/14/2016 2255   PHURINE 7.0 08/14/2016 2255   GLUCOSEU NEGATIVE 08/14/2016 2255   HGBUR NEGATIVE 08/14/2016 2255   BILIRUBINUR NEGATIVE 08/14/2016 2255   BILIRUBINUR negative 07/27/2016 1311   BILIRUBINUR neg 12/09/2014 1458   KETONESUR NEGATIVE 08/14/2016 2255   PROTEINUR NEGATIVE 08/14/2016 2255   UROBILINOGEN 0.2 07/27/2016 1311   NITRITE NEGATIVE 08/14/2016 2255   LEUKOCYTESUR NEGATIVE 08/14/2016 2255    Time coordinating discharge: Over 30 minutes  SIGNED:   Cordelia Poche, MD [facility] 08/27/2016, 4:12 PM Pager [phone]  If 7PM-7AM, please contact night-coverage [email or web address] Password [email or web address]

## 2016-08-27 NOTE — Care Management Note (Addendum)
Case Management Note  Patient Details  Name: Laura Neal MRN: [id number] Date of Birth: 1950-08-17  Subjective/Objective:                    Action/Plan: Date: Aug 27, 2016 Discharge orders review for case management needs.  HHC-RN AND pt Services will be through [facility] information called to intake and faxed to [place], BSN, [place], CCM: [phone] Expected Discharge Date:  08/27/16               Expected Discharge Plan:  Home/Self Care  In-House Referral:     Discharge planning Services  CM Consult  Post Acute Care Choice:    Choice offered to:     DME Arranged:    DME Agency:     HH Arranged:    HH Agency:     Status of Service:  Completed, signed off  If discussed at [employer] of Stay Meetings, dates discussed:    Additional Comments:  Leeroy Cha, RN 08/27/2016, 3:23 PM

## 2016-08-27 NOTE — Care Management Important Message (Signed)
Important Message  Patient Details  Name: SUZANNE KHO MRN: [id number] Date of Birth: October 07, 1950   Medicare Important Message Given:  Yes    Kerin Salen 08/27/2016, 11:11 AMImportant Message  Patient Details  Name: MAY MANRIQUE MRN: [id number] Date of Birth: [birth year]   Medicare Important Message Given:  Yes    Kerin Salen 08/27/2016, 11:11 AM

## 2016-08-28 ENCOUNTER — Encounter (HOSPITAL_COMMUNITY): Payer: Self-pay

## 2016-08-28 ENCOUNTER — Emergency Department (HOSPITAL_COMMUNITY): Payer: Medicare Other

## 2016-08-28 ENCOUNTER — Inpatient Hospital Stay (HOSPITAL_COMMUNITY)
Admission: EM | Admit: 2016-08-28 | Discharge: 2016-08-31 | DRG: 919 | Disposition: A | Discharge status: Home / Self Care | Payer: Medicare Other | Attending: Internal Medicine | Admitting: Internal Medicine

## 2016-08-28 DIAGNOSIS — Z905 Acquired absence of kidney: Secondary | ICD-10-CM

## 2016-08-28 DIAGNOSIS — D72829 Elevated white blood cell count, unspecified: Secondary | ICD-10-CM | POA: Diagnosis present

## 2016-08-28 DIAGNOSIS — N183 Chronic kidney disease, stage 3 unspecified: Secondary | ICD-10-CM | POA: Diagnosis present

## 2016-08-28 DIAGNOSIS — K219 Gastro-esophageal reflux disease without esophagitis: Secondary | ICD-10-CM | POA: Diagnosis present

## 2016-08-28 DIAGNOSIS — Z515 Encounter for palliative care: Secondary | ICD-10-CM | POA: Diagnosis not present

## 2016-08-28 DIAGNOSIS — D630 Anemia in neoplastic disease: Secondary | ICD-10-CM | POA: Diagnosis present

## 2016-08-28 DIAGNOSIS — J910 Malignant pleural effusion: Secondary | ICD-10-CM | POA: Diagnosis present

## 2016-08-28 DIAGNOSIS — Z6829 Body mass index (BMI) 29.0-29.9, adult: Secondary | ICD-10-CM | POA: Diagnosis not present

## 2016-08-28 DIAGNOSIS — Z66 Do not resuscitate: Secondary | ICD-10-CM | POA: Diagnosis present

## 2016-08-28 DIAGNOSIS — R0600 Dyspnea, unspecified: Secondary | ICD-10-CM

## 2016-08-28 DIAGNOSIS — E1122 Type 2 diabetes mellitus with diabetic chronic kidney disease: Secondary | ICD-10-CM | POA: Diagnosis present

## 2016-08-28 DIAGNOSIS — Z85528 Personal history of other malignant neoplasm of kidney: Secondary | ICD-10-CM

## 2016-08-28 DIAGNOSIS — C641 Malignant neoplasm of right kidney, except renal pelvis: Secondary | ICD-10-CM | POA: Diagnosis present

## 2016-08-28 DIAGNOSIS — R7989 Other specified abnormal findings of blood chemistry: Secondary | ICD-10-CM | POA: Diagnosis present

## 2016-08-28 DIAGNOSIS — C7802 Secondary malignant neoplasm of left lung: Secondary | ICD-10-CM | POA: Diagnosis present

## 2016-08-28 DIAGNOSIS — E43 Unspecified severe protein-calorie malnutrition: Secondary | ICD-10-CM | POA: Diagnosis present

## 2016-08-28 DIAGNOSIS — I10 Essential (primary) hypertension: Secondary | ICD-10-CM | POA: Diagnosis present

## 2016-08-28 DIAGNOSIS — Y838 Other surgical procedures as the cause of abnormal reaction of the patient, or of later complication, without mention of misadventure at the time of the procedure: Secondary | ICD-10-CM | POA: Diagnosis present

## 2016-08-28 DIAGNOSIS — Z79899 Other long term (current) drug therapy: Secondary | ICD-10-CM | POA: Diagnosis not present

## 2016-08-28 DIAGNOSIS — E440 Moderate protein-calorie malnutrition: Secondary | ICD-10-CM | POA: Diagnosis present

## 2016-08-28 DIAGNOSIS — Z7982 Long term (current) use of aspirin: Secondary | ICD-10-CM | POA: Diagnosis not present

## 2016-08-28 DIAGNOSIS — E785 Hyperlipidemia, unspecified: Secondary | ICD-10-CM | POA: Diagnosis present

## 2016-08-28 DIAGNOSIS — T85618A Breakdown (mechanical) of other specified internal prosthetic devices, implants and grafts, initial encounter: Principal | ICD-10-CM | POA: Diagnosis present

## 2016-08-28 DIAGNOSIS — Z96643 Presence of artificial hip joint, bilateral: Secondary | ICD-10-CM | POA: Diagnosis present

## 2016-08-28 DIAGNOSIS — L89152 Pressure ulcer of sacral region, stage 2: Secondary | ICD-10-CM | POA: Diagnosis present

## 2016-08-28 DIAGNOSIS — R0602 Shortness of breath: Secondary | ICD-10-CM

## 2016-08-28 DIAGNOSIS — Z86718 Personal history of other venous thrombosis and embolism: Secondary | ICD-10-CM | POA: Diagnosis not present

## 2016-08-28 DIAGNOSIS — J90 Pleural effusion, not elsewhere classified: Secondary | ICD-10-CM

## 2016-08-28 LAB — BASIC METABOLIC PANEL
Anion gap: 8 (ref 5–15)
BUN: 15 mg/dL (ref 6–20)
CALCIUM: 8.2 mg/dL — AB (ref 8.9–10.3)
CO2: 21 mmol/L — ABNORMAL LOW (ref 22–32)
Chloride: 112 mmol/L — ABNORMAL HIGH (ref 101–111)
Creatinine, Ser: 1.43 mg/dL — ABNORMAL HIGH (ref 0.44–1.00)
GFR, EST AFRICAN AMERICAN: 43 mL/min — AB (ref >60)
GFR, EST NON AFRICAN AMERICAN: 37 mL/min — AB (ref >60)
GLUCOSE: 142 mg/dL — AB (ref 65–99)
Potassium: 3.7 mmol/L (ref 3.5–5.1)
SODIUM: 141 mmol/L (ref 135–145)

## 2016-08-28 LAB — CBC WITH DIFFERENTIAL/PLATELET
BASOS PCT: 0 %
Basophils Absolute: 0 10*3/uL (ref 0.0–0.1)
EOS ABS: 0.1 10*3/uL (ref 0.0–0.7)
EOS PCT: 1 %
HCT: 26.1 % — ABNORMAL LOW (ref 36.0–46.0)
Hemoglobin: 8.1 g/dL — ABNORMAL LOW (ref 12.0–15.0)
LYMPHS ABS: 1.5 10*3/uL (ref 0.7–4.0)
Lymphocytes Relative: 10 %
MCH: 29.1 pg (ref 26.0–34.0)
MCHC: 31 g/dL (ref 30.0–36.0)
MCV: 93.9 fL (ref 78.0–100.0)
MONO ABS: 1 10*3/uL (ref 0.1–1.0)
MONOS PCT: 7 %
Neutro Abs: 12.3 10*3/uL — ABNORMAL HIGH (ref 1.7–7.7)
Neutrophils Relative %: 82 %
PLATELETS: 534 10*3/uL — AB (ref 150–400)
RBC: 2.78 MIL/uL — ABNORMAL LOW (ref 3.87–5.11)
RDW: 18.7 % — AB (ref 11.5–15.5)
WBC: 14.9 10*3/uL — ABNORMAL HIGH (ref 4.0–10.5)

## 2016-08-28 LAB — GLUCOSE, CAPILLARY: GLUCOSE-CAPILLARY: 95 mg/dL (ref 65–99)

## 2016-08-28 MED ORDER — ONDANSETRON HCL 4 MG PO TABS: 4.0000 mg | ORAL_TABLET | Freq: Four times a day (QID) | ORAL | Status: DC | PRN

## 2016-08-28 MED ORDER — OXYCODONE HCL 5 MG PO TABS
5.0000 mg | ORAL_TABLET | Freq: Four times a day (QID) | ORAL | Status: DC | PRN
  Administered 2016-08-29 – 2016-08-30 (×2): 5 mg via ORAL
  Filled 2016-08-28 (×2): qty 1

## 2016-08-28 MED ORDER — PANTOPRAZOLE SODIUM 40 MG PO TBEC
40.0000 mg | DELAYED_RELEASE_TABLET | Freq: Every day | ORAL | Status: DC
  Administered 2016-08-29 – 2016-08-31 (×3): 40 mg via ORAL
  Filled 2016-08-28 (×3): qty 1

## 2016-08-28 MED ORDER — ACETAMINOPHEN 650 MG RE SUPP: 650.0000 mg | Freq: Four times a day (QID) | RECTAL | Status: DC | PRN

## 2016-08-28 MED ORDER — INSULIN ASPART 100 UNIT/ML ~~LOC~~ SOLN: 0.0000 [IU] | Freq: Three times a day (TID) | SUBCUTANEOUS | Status: DC

## 2016-08-28 MED ORDER — ACETAMINOPHEN 325 MG PO TABS: 650.0000 mg | ORAL_TABLET | Freq: Four times a day (QID) | ORAL | Status: DC | PRN

## 2016-08-28 MED ORDER — MIRTAZAPINE 7.5 MG PO TABS
15.0000 mg | ORAL_TABLET | Freq: Every day | ORAL | Status: DC
  Administered 2016-08-28 – 2016-08-30 (×3): 15 mg via ORAL
  Filled 2016-08-28 (×3): qty 2

## 2016-08-28 MED ORDER — SODIUM CHLORIDE 0.9% FLUSH
3.0000 mL | Freq: Two times a day (BID) | INTRAVENOUS | Status: DC
  Administered 2016-08-29 – 2016-08-31 (×3): 3 mL via INTRAVENOUS

## 2016-08-28 MED ORDER — ASPIRIN EC 81 MG PO TBEC
81.0000 mg | DELAYED_RELEASE_TABLET | Freq: Every day | ORAL | Status: DC
  Administered 2016-08-29 – 2016-08-31 (×3): 81 mg via ORAL
  Filled 2016-08-28 (×3): qty 1

## 2016-08-28 MED ORDER — SODIUM CHLORIDE 0.9 % IV SOLN: 250.0000 mL | INTRAVENOUS | Status: DC | PRN

## 2016-08-28 MED ORDER — ROSUVASTATIN CALCIUM 10 MG PO TABS
10.0000 mg | ORAL_TABLET | Freq: Every day | ORAL | Status: DC
  Administered 2016-08-28 – 2016-08-30 (×3): 10 mg via ORAL
  Filled 2016-08-28 (×3): qty 1

## 2016-08-28 MED ORDER — SODIUM CHLORIDE 0.9% FLUSH: 3.0000 mL | INTRAVENOUS | Status: DC | PRN

## 2016-08-28 MED ORDER — INSULIN GLARGINE 100 UNIT/ML ~~LOC~~ SOLN
15.0000 [IU] | Freq: Every day | SUBCUTANEOUS | Status: DC
  Administered 2016-08-29 – 2016-08-31 (×3): 15 [IU] via SUBCUTANEOUS
  Filled 2016-08-28 (×3): qty 0.15

## 2016-08-28 MED ORDER — ONDANSETRON HCL 4 MG/2ML IJ SOLN: 4.0000 mg | Freq: Four times a day (QID) | INTRAMUSCULAR | Status: DC | PRN

## 2016-08-28 MED ORDER — TRAMADOL HCL 50 MG PO TABS
50.0000 mg | ORAL_TABLET | Freq: Four times a day (QID) | ORAL | Status: DC | PRN
  Administered 2016-08-31: 100 mg via ORAL
  Filled 2016-08-28: qty 2

## 2016-08-28 MED ORDER — SODIUM CHLORIDE 0.9% FLUSH
3.0000 mL | Freq: Two times a day (BID) | INTRAVENOUS | Status: DC
  Administered 2016-08-28 – 2016-08-31 (×3): 3 mL via INTRAVENOUS

## 2016-08-28 MED ORDER — INSULIN ASPART 100 UNIT/ML ~~LOC~~ SOLN: 0.0000 [IU] | Freq: Every day | SUBCUTANEOUS | Status: DC

## 2016-08-28 MED ORDER — POLYETHYLENE GLYCOL 3350 17 G PO PACK: 17.0000 g | PACK | Freq: Every day | ORAL | Status: DC | PRN

## 2016-08-28 NOTE — ED Provider Notes (Signed)
[facility] DEPT Provider Note   CSN: [id number] Arrival date & time: 08/28/16  1118     History   Chief Complaint Chief Complaint  Patient presents with  . Shortness of Breath    HPI Laura Neal is a 66 y.o. female.  Patient is a 66 year old female with past medical history of metastatic renal cell carcinoma who presents with complaints of shortness of breath. She was recently admitted with similar complaints and found to have a large left-sided pleural effusion. She had a Pleurx catheter placed, however this does not appear to be draining at home. She denies any fevers or chills. She denies any productive cough, chest pain, or fever.   The history is provided by the patient.  Shortness of Breath  This is a recurrent problem. The current episode started yesterday. The problem has been gradually worsening. Pertinent negatives include no fever, no cough, no sputum production, no chest pain and no leg swelling. Treatments tried: Drainage of Pleurx catheter. The treatment provided no relief. Associated medical issues do not include asthma or COPD.    Past Medical History:  Diagnosis Date  . Allergy   . Anemia   . Arthritis    "it was probably in my right hip" (05/26/2016)  . Cancer of right kidney ([place]) 2000   on PO chemotherapy /notes 05/26/2016  . GERD (gastroesophageal reflux disease)   . History of blood transfusion 03/2016   "w/hip replacement"  . History of stomach ulcers 1970s  . Hyperlipidemia   . Hypertension   . Pneumonia    "it's been awhile" (05/26/2016)  . Pulmonary embolism ([place]) 05/26/2016  . Seasonal allergies   . Seizures ([place])    "when I was young" (05/26/2016), none since childhood 07-08-16  . Type II diabetes mellitus ([place])   . Ulcer     Patient Active Problem List   Diagnosis Date Noted  . Pleural effusion   . Atelectasis   . Hypoxemia   . Pressure injury of skin 08/17/2016  . Moderate protein-calorie malnutrition ([place]) 08/14/2016  . AKI  (acute kidney injury) ([place]) 08/14/2016  . Pleural effusion on left 08/14/2016  . Acute kidney injury ([place]) 08/14/2016  . S/P hip replacement, right 06/16/2016  . Hx of adenomatous colonic polyps 06/16/2016  . Chest pain   . Shortness of breath   . Renal cell carcinoma of right kidney ([place])   . Malignant neoplasm metastatic to left lung ([place])   . Pulmonary embolus ([place]) 05/26/2016  . OA (osteoarthritis) of hip 04/22/2016  . Type 2 diabetes mellitus with stage 3 chronic kidney disease, without long-term current use of insulin ([place]) 12/09/2014    Past Surgical History:  Procedure Laterality Date  . ABDOMINAL HYSTERECTOMY  1987   "left an ovary"  . BACK SURGERY    . COLONOSCOPY    . ECTOPIC PREGNANCY SURGERY    . IR GUIDED DRAIN W CATHETER PLACEMENT  08/25/2016  . JOINT REPLACEMENT    . [place] SURGERY  1990s  . NEPHRECTOMY Right 2000   for cancer  . TOTAL HIP ARTHROPLASTY Right 04/22/2016   Procedure: RIGHT TOTAL HIP ARTHROPLASTY ANTERIOR APPROACH;  Surgeon: Gaynelle Arabian, MD;  Location: [facility] ORS;  Service: Orthopedics;  Laterality: Right;  . UPPER GASTROINTESTINAL ENDOSCOPY      OB History    No data available       Home Medications    Prior to Admission medications   Medication Sig Start Date End Date Taking? Authorizing Provider  acetaminophen (TYLENOL) 325 MG tablet Take 2 tablets (650 mg total) by mouth every 6 (six) hours as needed for mild pain (or Fever >/= 101). 04/27/16  Yes Perkins, Alexzandrew L, PA-C  BYETTA 10 MCG PEN 10 MCG/0.04ML SOPN injection Inject 0.04 mLs (10 mcg total) into the skin 2 (two) times daily. 08/27/16  Yes Mariel Aloe, MD  feeding supplement (BOOST / RESOURCE BREEZE) LIQD Take 1 Container by mouth 3 (three) times daily between meals. 08/27/16  Yes Mariel Aloe, MD  fluticasone (FLONASE) 50 MCG/ACT nasal spray Place 2 sprays into the nose daily. Patient taking differently: Place 2 sprays into the nose daily as needed for allergies.   11/26/12  Yes Robyn Haber, MD  insulin glargine (LANTUS) 100 UNIT/ML injection Inject 0.15 mLs (15 Units total) into the skin daily. 08/28/16  Yes Mariel Aloe, MD  methocarbamol (ROBAXIN) 500 MG tablet Take 1 tablet (500 mg total) by mouth every 6 (six) hours as needed for muscle spasms. 04/27/16  Yes Perkins, Alexzandrew L, PA-C  mirtazapine (REMERON) 15 MG tablet Take 1 tablet (15 mg total) by mouth at bedtime. 07/28/16  Yes Wyatt Portela, MD  pantoprazole (PROTONIX) 40 MG tablet Take 1 tablet (40 mg total) by mouth daily. Patient taking differently: Take 40 mg by mouth every other day.  06/16/16  Yes E[place], Amy S, PA-C  aspirin EC 81 MG tablet Take 81 mg by mouth daily.    [provider]  furosemide (LASIX) 20 MG tablet Take 20 mg by mouth daily as needed for fluid or edema.  10/23/14   [provider]  prochlorperazine (COMPAZINE) 10 MG tablet Take 1 tablet (10 mg total) by mouth every 6 (six) hours as needed for nausea or vomiting. 08/14/16   Wyatt Portela, MD  rosuvastatin (CRESTOR) 10 MG tablet Take 10 mg by mouth at bedtime.  11/18/15   [provider]  traMADol (ULTRAM) 50 MG tablet Take 1-2 tablets (50-100 mg total) by mouth every 6 (six) hours as needed (mild pain). 04/27/16   Perkins, Alexzandrew L, PA-C    Family History Family History  Problem Relation Age of Onset  . Hypertension Mother   . Hypertension Father   . Diabetes Sister   . Cancer Sister   . Diabetes Brother   . Hypertension Brother   . Colon cancer Neg Hx   . Esophageal cancer Neg Hx   . Stomach cancer Neg Hx   . Rectal cancer Neg Hx     Social History Social History  Substance Use Topics  . Smoking status: Former Smoker    Packs/day: 0.50    Years: 40.00    Types: Cigarettes    Quit date: 03/30/2004  . Smokeless tobacco: Former Systems developer    Types: Chew, [place]: 05/26/2016 "tried chew and snuff when I was young"  . Alcohol use No     Allergies    Codeine   Review of Systems Review of Systems  Constitutional: Negative for fever.  Respiratory: Positive for shortness of breath. Negative for cough and sputum production.   Cardiovascular: Negative for chest pain and leg swelling.  All other systems reviewed and are negative.    Physical Exam Updated Vital Signs BP 118/67 (BP Location: Right Arm)   Pulse (!) 109   Temp 97.5 F (36.4 C) (Oral)   Resp 18   SpO2 93%   Physical Exam  Constitutional: She is oriented to person, place, and time.  No distress.  Patient is a 66 year old female. She appears chronically ill. She appears somnolent and fatigued.  HENT:  Head: Normocephalic and atraumatic.  Neck: Normal range of motion. Neck supple.  Cardiovascular: Normal rate and regular rhythm.  Exam reveals no gallop and no friction rub.   No murmur heard. Pulmonary/Chest: Effort normal. She has no wheezes. She has no rales.  Breath sounds are diminished bilaterally.  Abdominal: Soft. Bowel sounds are normal. She exhibits no distension. There is no tenderness.  Musculoskeletal: Normal range of motion. She exhibits no edema.  Neurological: She is alert and oriented to person, place, and time.  Skin: Skin is warm and dry. She is not diaphoretic.  Nursing note and vitals reviewed.    ED Treatments / Results  Labs (all labs ordered are listed, but only abnormal results are displayed) Labs Reviewed  BASIC METABOLIC PANEL  CBC WITH DIFFERENTIAL/PLATELET    EKG  EKG Interpretation  Date/Time:  Friday August 28 2016 11:45:42 EDT Ventricular Rate:  109 PR Interval:    QRS Duration: 54 QT Interval:  378 QTC Calculation: 509 R Axis:   39 Text Interpretation:  Sinus tachycardia Low voltage, extremity and precordial leads Prolonged QT interval Confirmed by Veryl Speak [phone]) on 08/28/2016 2:42:15 PM       Radiology Dg Chest 2 View  Result Date: 08/28/2016 CLINICAL DATA:  Increasing shortness of breath and weakness since  being discharged from the hospital yesterday. EXAM: CHEST  2 VIEW COMPARISON:  Single-view of the chest 08/21/2016. FINDINGS: Left worse than right pleural effusions and airspace disease persist. The patient's left pleural effusion has increased somewhat since the prior examination. Pulmonary nodules on the right are identified as seen on prior exams. Known left pulmonary nodules are obscured. No pneumothorax. IMPRESSION: Persistent left greater than right pleural effusions and airspace disease. The patient's left effusion shows some increase since the most recent exam. Bilateral pulmonary nodules as seen on the prior study. Electronically Signed   By: Inge Rise M.D.   On: 08/28/2016 12:11    Procedures Procedures (including critical care time)  Medications Ordered in ED Medications - No data to display   Initial Impression / Assessment and Plan / ED Course  I have reviewed the triage vital signs and the nursing notes.  Pertinent labs & imaging results that were available during my care of the patient were reviewed by me and considered in my medical decision making (see chart for details).  Workup reveals a large recurrent left-sided pleural effusion. CT scan shows the Pleurx catheter today to be in appropriate position. I have discussed this with interventional radiology who has evaluated the patient. They feel as though the catheter is working appropriately, however is not draining due to multiple septations in the pleural effusion.  I have discussed the patient's care with Dr. Dyann Kief from the hospitalist service. He will speak with cardiothoracic surgery to determine whether or not they have anything to offer this patient.  Patient will now be [facility] to [facility] for further workup and intervention as indicated.  Final Clinical Impressions(s) / ED Diagnoses   Final diagnoses:  None    New Prescriptions New Prescriptions   No medications on file     Veryl Speak,  MD 09/02/16 2307

## 2016-08-28 NOTE — H&P (Signed)
History and Physical    EMYLIA Neal CZY:[id number] DOB: Nov 29, 1950 DOA: 08/28/2016  PCP: Bernerd Limbo, MD   I have briefly reviewed patients previous medical reports in [facility].  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 Patient with a recent lengthy admission secondary to shortness of breath due to recurrent malignant effusion. Patient will discharge on 08/27/16 after a Pleurx catheter was placed by interventional radiologist. After the patient got home she noticed progressive shortness of breath and inability to perform any kind of exertion due to worsening in her breathing. They attempted to have fluid drainage as instructed with no appropriate return from the Pleurx catheter, reason that may the patient come back to the hospital for further evaluation. She denies chest pain, fever, chills, headache, significant nausea, vomiting or abdominal pain. There is no dysuria or hematuria reported. After patient was placed on oxygen supplementation and as long as she is not engaging in 2 to much physical activity her symptoms subside. Case was discussed with cardiothoracic surgery and interventional radiologist at this moment patient will be transferred to [facility] for further evaluation and treatment.  ED Course: Patient was placed on oxygen supplementation, attempt repositioning and draining her Pleurx catheter did not really eat to any significant improvement of symptoms or drainage of pleural effusion. Images studies (CT and x-rays demonstrated worsening effusion in comparison to recent tests and demonstrated as well except patient's and loculations in her left pleural effusion). [facility] hospitalist has been called to admit for further evaluation, workup and  treatment.  Review of Systems:  All other systems reviewed and apart from HPI, are negative.  Past Medical History:  Diagnosis Date  . Allergy   . Anemia   . Arthritis    "it was probably in my right hip" (05/26/2016)  . Cancer of right kidney ([place]) 2000   on PO chemotherapy /notes 05/26/2016  . GERD (gastroesophageal reflux disease)   . History of blood transfusion 03/2016   "w/hip replacement"  . History of stomach ulcers 1970s  . Hyperlipidemia   . Hypertension   . Pneumonia    "it's been awhile" (05/26/2016)  . Pulmonary embolism ([place]) 05/26/2016  . Seasonal allergies   . Seizures ([place])    "when I was young" (05/26/2016), none since childhood 07-08-16  . Type II diabetes mellitus ([place])   . Ulcer     Past Surgical History:  Procedure Laterality Date  . ABDOMINAL HYSTERECTOMY  1987   "left an ovary"  . BACK SURGERY    . COLONOSCOPY    . ECTOPIC PREGNANCY SURGERY    . IR GUIDED DRAIN W CATHETER PLACEMENT  08/25/2016  . JOINT REPLACEMENT    . [place] SURGERY  1990s  . NEPHRECTOMY Right 2000   for cancer  . TOTAL HIP ARTHROPLASTY Right 04/22/2016   Procedure: RIGHT TOTAL HIP ARTHROPLASTY ANTERIOR APPROACH;  Surgeon: Gaynelle Arabian, MD;  Location: [facility];  Service: Orthopedics;  Laterality: Right;  . UPPER GASTROINTESTINAL ENDOSCOPY      Social History  reports that she quit smoking about 12 years ago. Her smoking use included Cigarettes. She has a 20.00 pack-year smoking history. She has quit using smokeless tobacco. Her smokeless tobacco use included Chew and Snuff. She reports that she does not drink alcohol or use  drugs.  Allergies  Allergen Reactions  . Codeine Rash    Family History  Problem Relation Age of Onset  . Hypertension Mother   . Hypertension Father   . Diabetes Sister   . Cancer Sister   . Diabetes Brother   . Hypertension Brother   . Colon cancer Neg Hx   . Esophageal cancer Neg Hx   . Stomach cancer Neg Hx   . Rectal cancer Neg Hx       Prior to Admission medications   Medication Sig Start Date End Date Taking? Authorizing Provider  aspirin EC 81 MG tablet Take 81 mg by mouth daily.   Yes [provider]  insulin glargine (LANTUS) 100 UNIT/ML injection Inject 0.15 mLs (15 Units total) into the skin daily. 08/28/16  Yes Mariel Aloe, MD  methocarbamol (ROBAXIN) 500 MG tablet Take 1 tablet (500 mg total) by mouth every 6 (six) hours as needed for muscle spasms. 04/27/16  Yes Perkins, Alexzandrew L, PA-C  mirtazapine (REMERON) 15 MG tablet Take 1 tablet (15 mg total) by mouth at bedtime. 07/28/16  Yes Wyatt Portela, MD  pantoprazole (PROTONIX) 40 MG tablet Take 1 tablet (40 mg total) by mouth daily. Patient taking differently: Take 40 mg by mouth every other day.  06/16/16  Yes Esterwood, Amy S, PA-C  rosuvastatin (CRESTOR) 10 MG tablet Take 10 mg by mouth at bedtime.  11/18/15  Yes [provider]  traMADol (ULTRAM) 50 MG tablet Take 1-2 tablets (50-100 mg total) by mouth every 6 (six) hours as needed (mild pain). 04/27/16  Yes Perkins, Alexzandrew L, PA-C    Physical Exam: Vitals:   08/28/16 1132 08/28/16 1147  BP: 118/67   Pulse: (!) 109   Resp: 18   Temp: 97.5 F (36.4 C)   TempSrc: Oral   SpO2: 98% 93%    Constitutional: Afebrile, currently stable and in no major distress. Requiring oxygen supplementation due to shortness of breath sensation. Patient denies chest pain.  Eyes: PERTLA, lids and conjunctivae normal, no nystagmus, no icterus  ENMT: Mucous membranes moist; no thrush; Posterior pharynx clear of any exudate or lesions.  Neck: supple, no masses, no thyromegaly, no JVD  Respiratory:  decrease breath sounds at the bases left > right; no wheezing, no using accessory muscles. Patient was tachypneic and with mild difficulty speaking in full sentences (any kind of exertion lead to significant shortness of breath) .  Cardiovascular:  mild tachycardia, no rubs, no gallops, S1 and S2 appreciated  on exam.   Abdomen:  soft, no guarding, no distention, positive bowel sounds.   Musculoskeletal:  no cyanosis or clubbing. Normal muscle tone.  Skin:  stage II sacral decubitus ulcer appreciated, no surrounding erythema or active drainage. Patient also with multiple bruises on her upper extremities (appears to be secondary to recent hospitalization and blood work). No induration or open wounds seen on exam.  Neurologic: CN 2-12 grossly intact. Sensation intact, DTR normal. Strength 3/5 in all 4 limbs Due to poor effort.  Psychiatric: Normal judgment and insight. Alert and oriented x 3. Normal mood.     Labs on Admission: I have personally reviewed following labs and imaging studies  CBC:  Recent Labs Lab 08/23/16 0650 08/24/16 0640 08/25/16 0609 08/26/16 0701 08/28/16 1300  WBC 10.8* 10.8* 12.2* 11.2* 14.9*  NEUTROABS 7.5 7.8* 8.9* 8.3* 12.3*  HGB 7.9* 8.2* 8.3* 7.8* 8.1*  HCT 25.2* 26.3* 27.3* 25.6* 26.1*  MCV 94.4 94.9 95.5 94.5 93.9  PLT  466* 463* 481* 420* 643*   Basic Metabolic Panel:  Recent Labs Lab 08/22/16 0613 08/23/16 0650 08/24/16 0640 08/25/16 0609 08/26/16 0701 08/27/16 0800 08/28/16 1300  NA 139 139 138 139 142 140 141  K 4.7 5.4* 4.9 4.3 3.9 3.4* 3.7  CL 113* 114* 113* 113* 115* 112* 112*  CO2 20* 18* 17* 17* 19* 20* 21*  GLUCOSE 104* 79 115* 94 104* 116* 142*  BUN 16 15 15 15 15 14 15   CREATININE 1.44* 1.40* 1.34* 1.39* 1.56* 1.47* 1.43*  CALCIUM 8.2* 8.0* 8.1* 8.1* 8.1* 8.1* 8.2*  MG 1.8 1.7  --   --  1.6*  --   --    Liver Function Tests:  Recent Labs Lab 08/22/16 0613  AST 22  ALT 11*  ALKPHOS 94  BILITOT 0.5  PROT 5.4*  ALBUMIN 1.6*   Coagulation Profile:  Recent Labs Lab 08/25/16 0609  INR 1.49   CBG:  Recent Labs Lab 08/26/16 1643 08/26/16 2143 08/27/16 0737 08/27/16 1156 08/27/16 1648  GLUCAP 119* 112* 111* 124* 113*   Urine analysis:    Component Value Date/Time   COLORURINE YELLOW 08/14/2016 2255    APPEARANCEUR CLEAR 08/14/2016 2255   LABSPEC 1.011 08/14/2016 2255   PHURINE 7.0 08/14/2016 2255   GLUCOSEU NEGATIVE 08/14/2016 2255   HGBUR NEGATIVE 08/14/2016 2255   BILIRUBINUR NEGATIVE 08/14/2016 2255   BILIRUBINUR negative 07/27/2016 1311   BILIRUBINUR neg 12/09/2014 1458   KETONESUR NEGATIVE 08/14/2016 2255   PROTEINUR NEGATIVE 08/14/2016 2255   UROBILINOGEN 0.2 07/27/2016 1311   NITRITE NEGATIVE 08/14/2016 2255   LEUKOCYTESUR NEGATIVE 08/14/2016 2255     Radiological Exams on Admission: Dg Chest 2 View  Result Date: 08/28/2016 CLINICAL DATA:  Increasing shortness of breath and weakness since being discharged from the hospital yesterday. EXAM: CHEST  2 VIEW COMPARISON:  Single-view of the chest 08/21/2016. FINDINGS: Left worse than right pleural effusions and airspace disease persist. The patient's left pleural effusion has increased somewhat since the prior examination. Pulmonary nodules on the right are identified as seen on prior exams. Known left pulmonary nodules are obscured. No pneumothorax. IMPRESSION: Persistent left greater than right pleural effusions and airspace disease. The patient's left effusion shows some increase since the most recent exam. Bilateral pulmonary nodules as seen on the prior study. Electronically Signed   By: Inge Rise M.D.   On: 08/28/2016 12:11   Ct Chest Wo Contrast  Result Date: 08/28/2016 CLINICAL DATA:  Pleural effusion, PleurX tube not draining, worsened shortness of breath with exertion, history of RIGHT renal cancer, hypertension, former emboli EXAM: CT CHEST WITHOUT CONTRAST TECHNIQUE: Multidetector CT imaging of the chest was performed following the standard protocol without IV contrast. Sagittal and coronal MPR images reconstructed from axial data set. COMPARISON:  08/16/2016 FINDINGS: Cardiovascular: Atherosclerotic calcifications aorta and coronary arteries. Minimal pericardial effusion. Mediastinum/Nodes: Mediastinal adenopathy slightly  increased since previous exam. AP window node 21 mm short axis image 51 previously 18 mm. RIGHT paratracheal node 14 mm short axis image 42 previously 10 mm. Subcarinal node 15 mm short axis image 65 previously 11 mm. Esophagus unremarkable. Base of cervical region normal appearance. Lungs/Pleura: PleurX catheter coiled in the inferolateral LEFT hemithorax. Increased LEFT pleural effusion since previous exam, partially loculated. Multiple septations are identified as well as larger more rounded areas of opacification within the pleural effusion likely representing malignant pleural deposits. Increased atelectasis of LEFT upper and LEFT lower lobes. Pulmonary metastases again visualized, largest RIGHT mid lung 11 mm  short axis image 44 and 14 mm LEFT apex image 24. No pneumothorax. Upper Abdomen: Complex cystic and solid mass consistent with known neoplasm at the upper LEFT kidney, 8.9 x 4.6 cm image 155. Enlarged LEFT adrenal gland 2.4 x 1.3 cm image 134 little changed. Pancreas appears mildly enlarged and slightly ill-defined at the pancreatic body unchanged since the previous study suggesting pancreatitis. No additional upper abdominal abnormalities. Surgically absent RIGHT kidney. Thickening of RIGHT adrenal gland without discrete mass. Musculoskeletal: No osseous metastases. IMPRESSION: Large malignant LEFT pleural effusion with septations and tumor nodularity, increased in size since previous exam. Moderate RIGHT pleural effusion, increased since previous exam. Pulmonary metastases again identified with increased compressive atelectasis of both lungs. Increased sizes of mediastinal adenopathy. Complex cystic and solid LEFT renal neoplasm and probable LEFT adrenal metastasis. Suspected pancreatitis changes again seen. Aortic atherosclerosis and coronary arterial calcification with minimal pericardial effusion. Electronically Signed   By: Lavonia Dana M.D.   On: 08/28/2016 15:13    EKG:  No acute ischemic  changes, normal axis, sinus tachycardia; mild borderline QT prolongation. No acute ischemic changes.  Assessment/Plan 1-shortness of breath due to recurrent malignant pleural effusion:  -Associated with recurrence effusion build up due to underlying metastatic malignancy -After a recent long hospitalization patient has failed a Pleurx catheter implantation and continue experiencing shortness of breath, hypoxia and inability to perform any exertion. -Repeat scan in the emergency department demonstrated fluid reaccumulation with associated septations and loculations in her pleural space. -Patient is afebrile, and there is no signs of infection currently. -Will provide oxygen supplementation -Discussed with family and patient at bedside about overall poor prognosis and will have palliative care coming along to further discuss goals of care and advance directives. -In the event of significant worsening of symptoms patient will require thoracentesis (to be performed over the weekend by IR or pulmonary service). -Case has been discussed with cardiothoracic surgery (Dr. Roxy Manns), and they will see patient in order to discuss further potential intervention; unsure at this moment if the patient will qualify for VATS -will continue oxygen supplementation and supportive care.  2-type 2 diabetes mellitus: With nephropathy -Modified carbohydrate diet has been ordered and will use sliding scale insulin  3-history of DVT/PE -With recent recurrent hemothorax -Eliquis has been discontinued -will use SCD's and ASA for now  4-moderate protein calorie malnutrition -Will continue feeding supplements   5-metastatic renal cell carcinoma -Actively receiving treatment by Dr. Alen Blew -until recently was on Vontrient -will follow his recommendations -but base on disease progression, prognosis appears to be poor.  6-thrombocytosis and leukocytosis -A stable overall in comparison to recent blood work -Most likely  associated with underlying malignancy and a stress demargination -No antibiotics -will monitor trend   7-stage II sacral decubitus ulcer -Present on admission -Will follow preventative measures -Wound care services consulted  8-chronic kidney disease stage III -Renal function essentially back to baseline -Will minimize/point nephrotoxic agents -Follow renal function trend  Time: 60 minutes (more than 50% of this time was dedicated to face-to-face examination and discussion of patient's care at bedside with her family; also discussion with consulting physicians and arranging plan of care).   DVT prophylaxis: SCDs Code Status: Full code Family Communication: Sisters at bedside  Disposition Plan: To be determined. At this moment anticipating more than 2 midnights inside the hospital and depending clinical response and progression she might require going to a facility for further care and rehabilitation versus even home with hospice. Consults called: Cardiothoracic surgery and IR;  Dr. Alen Blew made aware of patient admission through [place] communication. Palliative care also consulted. Admission status: Patient will be admitted to inpatient service, telemetry bed; LOS < 2 midnights   Barton Dubois MD [facility] Pager [phone] If 7PM-7AM, please contact night-coverage [email or web address] Password [email or web address]  08/28/2016, 5:40 PM

## 2016-08-28 NOTE — ED Notes (Signed)
[facility] called  

## 2016-08-28 NOTE — ED Provider Notes (Signed)
Pt to be admitted by [facility], and transferred to [facility] for ultimate CT surgery eval for consideration of possible additional treatment for her malignant effusions.   Tanna Furry, MD 08/28/16 1730

## 2016-08-28 NOTE — ED Notes (Signed)
[facility] called and was given report on pt. 

## 2016-08-28 NOTE — ED Notes (Signed)
Bed: BT59 Expected date:  Expected time:  Means of arrival: Ambulance Comments: EMS Cancer pt

## 2016-08-28 NOTE — ED Notes (Signed)
[facility] here to transport pt to [facility]. 

## 2016-08-28 NOTE — ED Triage Notes (Signed)
Per EMS, pt from home complains of SOB. Pt just came home from hospital yesterday. Pt had pluerex tube placed in left side while she was admitted, but is waiting on home health to use it. Pt last had fluid drained yesterday at hospital. SOB is worse with exertion. Pt is taking break from oral chemo. Initial SpO2 was 91 on RA, which came up to 99% on 3L .  HR 108 BP 130/90

## 2016-08-28 NOTE — ED Notes (Signed)
Pleurx kit attached with <10cc drainage. Delo, MD notified.

## 2016-08-28 NOTE — Progress Notes (Signed)
Patient ID: Laura Neal, female   DOB: 09/28/1950, 66 y.o.   MRN: [id number]    Referring Physician(s): Dr. Veryl Speak  Supervising Physician: Corrie Mckusick  Patient Status: [facility] - ED  Chief Complaint: Malfunctioning Pleurx catheter  Subjective: The patient is known to the interventional radiology service as she had a left chest Pleurx catheter placed on 08/25/2016 for metastatic renal cell carcinoma with recurrent left pleural effusion. She was just discharged yesterday from the hospital. She presents to the emergency department today complaining of shortness of breath as well as minimal fluid being able to be drained from her Pleurx catheter. In the emergency department she had a CT scan of her chest completed to evaluate the Pleurx catheter. Her CT scan shows a large malignant left pleural effusion with septations and tumor nodularities. We have been asked to evaluate her Pleurx catheter for further recommendations and to make sure that it is functioning well.  Allergies: Codeine  Medications: Prior to Admission medications   Medication Sig Start Date End Date Taking? Authorizing Provider  aspirin EC 81 MG tablet Take 81 mg by mouth daily.   Yes [provider]  insulin glargine (LANTUS) 100 UNIT/ML injection Inject 0.15 mLs (15 Units total) into the skin daily. 08/28/16  Yes Mariel Aloe, MD  methocarbamol (ROBAXIN) 500 MG tablet Take 1 tablet (500 mg total) by mouth every 6 (six) hours as needed for muscle spasms. 04/27/16  Yes Perkins, Alexzandrew L, PA-C  mirtazapine (REMERON) 15 MG tablet Take 1 tablet (15 mg total) by mouth at bedtime. 07/28/16  Yes Wyatt Portela, MD  pantoprazole (PROTONIX) 40 MG tablet Take 1 tablet (40 mg total) by mouth daily. Patient taking differently: Take 40 mg by mouth every other day.  06/16/16  Yes Esterwood, Amy S, PA-C  rosuvastatin (CRESTOR) 10 MG tablet Take 10 mg by mouth at bedtime.  11/18/15  Yes [provider]  traMADol  (ULTRAM) 50 MG tablet Take 1-2 tablets (50-100 mg total) by mouth every 6 (six) hours as needed (mild pain). 04/27/16  Yes Perkins, Alexzandrew L, PA-C    Vital Signs: BP 118/67 (BP Location: Right Arm)   Pulse (!) 109   Temp 97.5 F (36.4 C) (Oral)   Resp 18   SpO2 93%   Physical Exam: GEN: Frail appearing black female who is laying in bed in no acute distress. Chest: Left Pleurx catheter in place with no evidence of infection around the drain site. Her Pleurx was accessed under sterile technique. Minimal bloody fluid was removed. Her Pleurx was flushed and this flushed very easily.  Imaging: Dg Chest 2 View  Result Date: 08/28/2016 CLINICAL DATA:  Increasing shortness of breath and weakness since being discharged from the hospital yesterday. EXAM: CHEST  2 VIEW COMPARISON:  Single-view of the chest 08/21/2016. FINDINGS: Left worse than right pleural effusions and airspace disease persist. The patient's left pleural effusion has increased somewhat since the prior examination. Pulmonary nodules on the right are identified as seen on prior exams. Known left pulmonary nodules are obscured. No pneumothorax. IMPRESSION: Persistent left greater than right pleural effusions and airspace disease. The patient's left effusion shows some increase since the most recent exam. Bilateral pulmonary nodules as seen on the prior study. Electronically Signed   By: Inge Rise M.D.   On: 08/28/2016 12:11   Ct Chest Wo Contrast  Result Date: 08/28/2016 CLINICAL DATA:  Pleural effusion, PleurX tube not draining, worsened shortness of breath with exertion, history of  RIGHT renal cancer, hypertension, former emboli EXAM: CT CHEST WITHOUT CONTRAST TECHNIQUE: Multidetector CT imaging of the chest was performed following the standard protocol without IV contrast. Sagittal and coronal MPR images reconstructed from axial data set. COMPARISON:  08/16/2016 FINDINGS: Cardiovascular: Atherosclerotic calcifications aorta and  coronary arteries. Minimal pericardial effusion. Mediastinum/Nodes: Mediastinal adenopathy slightly increased since previous exam. AP window node 21 mm short axis image 51 previously 18 mm. RIGHT paratracheal node 14 mm short axis image 42 previously 10 mm. Subcarinal node 15 mm short axis image 65 previously 11 mm. Esophagus unremarkable. Base of cervical region normal appearance. Lungs/Pleura: PleurX catheter coiled in the inferolateral LEFT hemithorax. Increased LEFT pleural effusion since previous exam, partially loculated. Multiple septations are identified as well as larger more rounded areas of opacification within the pleural effusion likely representing malignant pleural deposits. Increased atelectasis of LEFT upper and LEFT lower lobes. Pulmonary metastases again visualized, largest RIGHT mid lung 11 mm short axis image 44 and 14 mm LEFT apex image 24. No pneumothorax. Upper Abdomen: Complex cystic and solid mass consistent with known neoplasm at the upper LEFT kidney, 8.9 x 4.6 cm image 155. Enlarged LEFT adrenal gland 2.4 x 1.3 cm image 134 little changed. Pancreas appears mildly enlarged and slightly ill-defined at the pancreatic body unchanged since the previous study suggesting pancreatitis. No additional upper abdominal abnormalities. Surgically absent RIGHT kidney. Thickening of RIGHT adrenal gland without discrete mass. Musculoskeletal: No osseous metastases. IMPRESSION: Large malignant LEFT pleural effusion with septations and tumor nodularity, increased in size since previous exam. Moderate RIGHT pleural effusion, increased since previous exam. Pulmonary metastases again identified with increased compressive atelectasis of both lungs. Increased sizes of mediastinal adenopathy. Complex cystic and solid LEFT renal neoplasm and probable LEFT adrenal metastasis. Suspected pancreatitis changes again seen. Aortic atherosclerosis and coronary arterial calcification with minimal pericardial effusion.  Electronically Signed   By: Lavonia Dana M.D.   On: 08/28/2016 15:13   Ir Guided Niel Hummer W Catheter Placement  Result Date: 08/25/2016 INDICATION: Recurrent malignant left pleural effusion EXAM: ULTRASOUND AND FLUOROSCOPIC LEFT TUNNELED PLEURAL DRAIN (PLEURX CATHETER) MEDICATIONS: Ancef 2 g, administered within 1 hour of the procedure ANESTHESIA/SEDATION: Fentanyl 100 mcg IV; Versed 2.0 mg IV Moderate Sedation Time:  20 minutes The patient was continuously monitored during the procedure by the interventional radiology nurse under my direct supervision. COMPLICATIONS: None immediate. PROCEDURE: Informed written consent was obtained from the patient after a thorough discussion of the procedural risks, benefits and alternatives. All questions were addressed. Maximal Sterile Barrier Technique was utilized including caps, mask, sterile gowns, sterile gloves, sterile drape, hand hygiene and skin antiseptic. A timeout was performed prior to the initiation of the procedure. Previous imaging reviewed. Right lower chest marked in the mid axillary line through a lower intercostal space. Under sterile conditions and local anesthesia, ultrasound percutaneous access performed of the left pleural space. Needle position confirmed with ultrasound. Images obtained for documentation. There was return of bloody pleural fluid. Amplatz guidewire inserted. In the adjacent soft tissues, a subcutaneous tunnel was created. The PleurX catheter was tunneled subcutaneously to the puncture site and advanced into the left chest through the valved pleural sheath. Position confirmed with fluoroscopy. Thoracentesis performed yielding 5 minutes 50 cc bloody pleural fluid. Catheter secured with a Prolene suture. Entry site closed with subcuticular Vicryl suture and derma bond. Sterile dressing applied. No immediate complication. Patient tolerated the procedure well. IMPRESSION: Successful ultrasound and fluoroscopic 15.5 French left tunneled pleural  drain (PleurX catheter). 550 cc left  thoracentesis performed after insertion. Electronically Signed   By: Jerilynn Mages.  Shick M.D.   On: 08/25/2016 10:44    Labs:  CBC:  Recent Labs  08/24/16 0640 08/25/16 0609 08/26/16 0701 08/28/16 1300  WBC 10.8* 12.2* 11.2* 14.9*  HGB 8.2* 8.3* 7.8* 8.1*  HCT 26.3* 27.3* 25.6* 26.1*  PLT 463* 481* 420* 534*    COAGS:  Recent Labs  04/17/16 1408  08/14/16 2336 08/15/16 0201 08/15/16 1018 08/15/16 2230 08/21/16 1312 08/25/16 0609  INR 0.95  < > 1.96 1.83  --   --  1.59 1.49  APTT 33  --  50*  --  145* 142*  --   --   < > = values in this interval not displayed.  BMP:  Recent Labs  08/25/16 0609 08/26/16 0701 08/27/16 0800 08/28/16 1300  NA 139 142 140 141  K 4.3 3.9 3.4* 3.7  CL 113* 115* 112* 112*  CO2 17* 19* 20* 21*  GLUCOSE 94 104* 116* 142*  BUN 15 15 14 15   CALCIUM 8.1* 8.1* 8.1* 8.2*  CREATININE 1.39* 1.56* 1.47* 1.43*  GFRNONAA 38* 33* 36* 37*  GFRAA 44* 39* 41* 43*    LIVER FUNCTION TESTS:  Recent Labs  07/27/16 1308 07/28/16 1244 08/14/16 1635 08/19/16 1016 08/22/16 0613  BILITOT 0.4 0.54 0.7  --  0.5  AST 11 10 18   --  22  ALT 5 <6 9*  --  11*  ALKPHOS 122* 111 92  --  94  PROT 6.4 6.6 7.4 5.2* 5.4*  ALBUMIN 3.2* 2.4* 2.7*  --  1.6*    Assessment and Plan: 1. Recurrent loculated malignant left pleural effusion 2. Metastatic renal cell carcinoma  The patient's Pleurx catheter has been evaluated and appears to be functioning well. Her CT scan shows significant septations and loculations within this collection. Unfortunately the Pleurx catheter is unable to drain all the different loculations. I offered the patient a thoracentesis for shortness of breath but did warn her that this may not help either as I will not be able to drain the full effusion given all the septations.  We also discussed that if the patient felt stable to return home that we would have her follow up in clinic next week to further discuss  future plans for her Pleurx given the amount of loculations that are present. The family then began to talk about concerns about the patient being at home without 24-hour supervision. They began to ask more social questions regarding how this could be arranged. Eventually they stated they would like for the patient to essentially just be admitted to the hospital.  They did not give me any answers regarding whether they would want a thoracentesis or follow-up in our clinic. I spoke with Dr. Stark Jock to relay all of their concerns.  We are available as needed. The Pleurx catheter may be used as needed however it is very unlikely to give much fluid when accessed.  Electronically Signed: Henreitta Cea 08/28/2016, 5:01 PM   I spent a total of 25 Minutes at the the patient's bedside AND on the patient's hospital floor or unit, greater than 50% of which was counseling/coordinating care for malignant left pleural effusion

## 2016-08-29 ENCOUNTER — Encounter (HOSPITAL_COMMUNITY): Payer: Self-pay | Admitting: Thoracic Surgery (Cardiothoracic Vascular Surgery)

## 2016-08-29 DIAGNOSIS — R0600 Dyspnea, unspecified: Secondary | ICD-10-CM

## 2016-08-29 DIAGNOSIS — Z515 Encounter for palliative care: Secondary | ICD-10-CM

## 2016-08-29 DIAGNOSIS — J910 Malignant pleural effusion: Secondary | ICD-10-CM

## 2016-08-29 DIAGNOSIS — E440 Moderate protein-calorie malnutrition: Secondary | ICD-10-CM

## 2016-08-29 DIAGNOSIS — C641 Malignant neoplasm of right kidney, except renal pelvis: Secondary | ICD-10-CM

## 2016-08-29 DIAGNOSIS — E43 Unspecified severe protein-calorie malnutrition: Secondary | ICD-10-CM | POA: Insufficient documentation

## 2016-08-29 DIAGNOSIS — N183 Chronic kidney disease, stage 3 (moderate): Secondary | ICD-10-CM

## 2016-08-29 DIAGNOSIS — E1122 Type 2 diabetes mellitus with diabetic chronic kidney disease: Secondary | ICD-10-CM

## 2016-08-29 LAB — GLUCOSE, CAPILLARY
GLUCOSE-CAPILLARY: 99 mg/dL (ref 65–99)
GLUCOSE-CAPILLARY: 90 mg/dL (ref 65–99)
GLUCOSE-CAPILLARY: 88 mg/dL (ref 65–99)
Glucose-Capillary: 104 mg/dL — ABNORMAL HIGH (ref 65–99)

## 2016-08-29 LAB — COMPREHENSIVE METABOLIC PANEL
ALK PHOS: 122 U/L (ref 38–126)
ALT: 19 U/L (ref 14–54)
ANION GAP: 12 (ref 5–15)
AST: 42 U/L — ABNORMAL HIGH (ref 15–41)
Albumin: 1.6 g/dL — ABNORMAL LOW (ref 3.5–5.0)
BUN: 12 mg/dL (ref 6–20)
CALCIUM: 8.1 mg/dL — AB (ref 8.9–10.3)
CHLORIDE: 108 mmol/L (ref 101–111)
CO2: 16 mmol/L — AB (ref 22–32)
CREATININE: 1.28 mg/dL — AB (ref 0.44–1.00)
GFR, EST AFRICAN AMERICAN: 49 mL/min — AB (ref >60)
GFR, EST NON AFRICAN AMERICAN: 42 mL/min — AB (ref >60)
Glucose, Bld: 98 mg/dL (ref 65–99)
Potassium: 3.9 mmol/L (ref 3.5–5.1)
SODIUM: 136 mmol/L (ref 135–145)
Total Bilirubin: 0.7 mg/dL (ref 0.3–1.2)
Total Protein: 5.7 g/dL — ABNORMAL LOW (ref 6.5–8.1)

## 2016-08-29 LAB — CBC
HCT: 27.5 % — ABNORMAL LOW (ref 36.0–46.0)
HEMOGLOBIN: 8.2 g/dL — AB (ref 12.0–15.0)
MCH: 28.3 pg (ref 26.0–34.0)
MCHC: 29.8 g/dL — ABNORMAL LOW (ref 30.0–36.0)
MCV: 94.8 fL (ref 78.0–100.0)
PLATELETS: 550 10*3/uL — AB (ref 150–400)
RBC: 2.9 MIL/uL — AB (ref 3.87–5.11)
RDW: 19.2 % — ABNORMAL HIGH (ref 11.5–15.5)
WBC: 14.8 10*3/uL — AB (ref 4.0–10.5)

## 2016-08-29 LAB — PHOSPHORUS: PHOSPHORUS: 3.5 mg/dL (ref 2.5–4.6)

## 2016-08-29 LAB — MAGNESIUM: Magnesium: 1.7 mg/dL (ref 1.7–2.4)

## 2016-08-29 MED ORDER — BOOST / RESOURCE BREEZE PO LIQD
1.0000 | Freq: Three times a day (TID) | ORAL | Status: DC
  Administered 2016-08-30 – 2016-08-31 (×3): 1 via ORAL

## 2016-08-29 NOTE — Progress Notes (Signed)
Initial Nutrition Assessment  DOCUMENTATION CODES:   Severe malnutrition in context of chronic illness  INTERVENTION:   -Boost Breeze po TID, each supplement provides 250 kcal and 9 grams of protein -Liberalize diet to Regular   NUTRITION DIAGNOSIS:   Malnutrition (Severe) related to chronic illness, cancer and cancer related treatments as evidenced by energy intake < or equal to 75% for > or equal to 1 month, mild depletion of body fat, severe depletion of muscle mass, moderate to severe fluid accumulation.  GOAL:   Patient will meet greater than or equal to 90% of their needs  MONITOR:   PO intake, Supplement acceptance, Labs, Weight trends  REASON FOR ASSESSMENT:   Malnutrition Screening Tool    ASSESSMENT:    66 yo female admitted with SOB with recurrent malignant pleural effusion. Pt with recurrent renal cell carcinoma with mets to lung and adrenal glands. Pt with pleurx catheter placement on 08/27/16. Pt with additional hx of GERD, HTN, HL, DM, nephrectomy.   Pt very weak on visit today, sisters and mother at bedside. Pt reports poor appetite, ate 30% at breakfast this AM. Pt reports no desire to eat, also reports abdominal pain that limites her ability to eat. Even the act of chewing is tiring for the patient. Pt eats small snacks through out the day (cereal, fruit, cottage cheese, soup, jello) but no "meals". Pt drinks Boost Breeze at home  Noted weight is up per weight encounters but pt with moderate edema; pt reports LE swelling began 1 week ago. Pt seen by RD on 5/20 and pt with 9% wt loss in 1.5 months (significant for time frame)  Nutrition-Focused physical exam completed. Findings are mild fat depletion, mild/moderate to severe muscle depletion, and moderate edema.   Labs: CBGs 95-104 Meds: ss novolog, lantus, remeron  Diet Order:  Diet regular Room service appropriate? Yes; Fluid consistency: Thin  Skin:  Wound (see comment) (stage II sacrum, incision on  back)  Last BM:  5/31  Height:   Ht Readings from Last 1 Encounters:  08/28/16 5\' 8"  (1.727 m)    Weight:   Wt Readings from Last 1 Encounters:  08/29/16 194 lb 3.2 oz (88.1 kg)    Ideal Body Weight:     BMI:  Body mass index is 29.53 kg/m.  Estimated Nutritional Needs:   Kcal:  2000-2200 kcals  Protein:  100-110 g  Fluid:  >/= 2 L  EDUCATION NEEDS:   No education needs identified at this time  Judson, [place], LDN [phone] Pager  [phone] Weekend/On-Call Pager

## 2016-08-29 NOTE — Progress Notes (Signed)
Pleurx drained per order using sterile technique. Only about 5cc of red pleural fluid removed. Pt tolerated the procedure well.  Ferdinand Lango, RN

## 2016-08-29 NOTE — Progress Notes (Addendum)
PROGRESS NOTE        PATIENT DETAILS Name: Laura Neal Age: 66 y.o. Sex: female Date of Birth: 1951/01/22 Admit Date: 08/28/2016 Admitting Physician Barton Dubois, MD ENI:[id number], [name], MD  Brief Narrative: Patient is a 66 y.o. 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 She presented to the ED on 6/1 recurrent shortness of breath-was found to have a large recurrent (likely malignant) pleural effusion. She was subsequently transferred to [facility] for cardiothoracic surgery evaluation. See below for further details  Subjective: Lying comfortably in bed-appears frail and chronically ill-appearing.  Assessment/Plan: Malignant/recurrent pleural effusion: Unfortunately now has developed loculations/trabeculation-seen by cardiothoracic surgery earlier today, felt to be a poor candidate for aggressive care-I have consulted palliative care-cardiothoracic surgery to meet with family and discuss further. In the meantime, plans are to continue with supportive measures this time.  Metastatic renal cell carcinoma: Unfortunately continues to have recurrent malignant pleural effusion-reviewed most recent oncology note on 5/24-patient no longer is on votrient-I suspect her long-term prognosis is rather poor. Have involved palliative care-will discuss with primary oncology on Monday.  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  Thrombocytosis/leukocytosis: I suspect this is reactive likely due to underlying malignancy rather than a infection. Plans are to follow CBC  Anemia: Likely secondary to chronic disease, follow.  DM-2: CBGs stable, continue 15 units  of Lantus daily and SSI. Plans are to follow CBGs and adjust regimen accordingly.  Dyslipidemia: Continue statin  Chronic kidney disease stage III: Creatinine close to usual baseline-follow.  Moderate protein calorie malnutrition: Continue supplements  Stage II sacral decubitus ulcer: Present on admission, wound care RN consulted.  Telemetry (independently reviewed):NSR  New Imaging studies (independently reviewed): CT chest-large left-sided effusion  Old records review: prior d/c summary on 5/31  Morning labs/Imaging ordered:   DVT Prophylaxis: SCD's  Code Status: Full code  Family Communication: None at bedside   Disposition Plan: Remain inpatient-but will plan on Home health vs SNF on discharge  Antimicrobial agents: Anti-infectives    None      Procedures: None  CONSULTS:  Cardiothoracic surgery and palliative care  Time spent: 25 minutes-Greater than 50% of this time was spent in counseling, explanation of diagnosis, planning of further management, and coordination of care.  MEDICATIONS: Scheduled Meds: . aspirin EC  81 mg Oral Daily  . feeding supplement  1 Container Oral TID BM  . insulin aspart  0-5 Units Subcutaneous QHS  . insulin aspart  0-9 Units Subcutaneous TID WC  . insulin glargine  15 Units Subcutaneous Daily  . mirtazapine  15 mg Oral QHS  . pantoprazole  40 mg Oral Daily  . rosuvastatin  10 mg Oral QHS  . sodium chloride flush  3 mL Intravenous Q12H  . sodium chloride flush  3 mL Intravenous Q12H   Continuous Infusions: . sodium chloride     PRN Meds:.sodium chloride, acetaminophen **OR** acetaminophen, ondansetron **OR** ondansetron (ZOFRAN) IV, oxyCODONE, polyethylene glycol, sodium chloride flush, traMADol   PHYSICAL EXAM: Vital signs: Vitals:   08/28/16 2100 08/28/16 2120 08/28/16 2215 08/29/16 0506  BP: 115/60 115/60 129/83  116/60  Pulse: (!) 103 (!) 110 (!) 109 100  Resp:  16 17 16   Temp:   98 F (36.7 C) 98.3 F (36.8  C)  TempSrc:      SpO2: 97% 96% 98% 98%  Weight:   87.6 kg (193 lb 3.2 oz) 88.1 kg (194 lb 3.2 oz)  Height:   5\' 8"  (1.727 m)    Filed Weights   08/28/16 2215 08/29/16 0506  Weight: 87.6 kg (193 lb 3.2 oz) 88.1 kg (194 lb 3.2 oz)   Body mass index is 29.53 kg/m.   General appearance :Awake, alert. Looks frail and chronically sick appearing. Mildly dyspneic but comfortable. Eyes:, pupils equally reactive to light and accomodation HEENT: Atraumatic and Normocephalic Neck: supple, no JVD. Resp: Decreased air entry on the left-some scattered rhonchi CVS: S1 S2 regular, no murmurs.  GI: Bowel sounds present, Non tender and not distended with no gaurding, rigidity or rebound. Extremities: B/L Lower Ext shows no edema, both legs are warm to touch Neurology:  speech clear,Non focal, sensation is grossly intact. Psychiatric: Normal judgment and insight. Alert and oriented x 3. Musculoskeletal:No digital cyanosis Skin:No Rash, warm and dry Wounds:N/A  I have personally reviewed following labs and imaging studies  LABORATORY DATA: CBC:  Recent Labs Lab 08/23/16 0650 08/24/16 0640 08/25/16 0609 08/26/16 0701 08/28/16 1300 08/29/16 0612  WBC 10.8* 10.8* 12.2* 11.2* 14.9* 14.8*  NEUTROABS 7.5 7.8* 8.9* 8.3* 12.3*  --   HGB 7.9* 8.2* 8.3* 7.8* 8.1* 8.2*  HCT 25.2* 26.3* 27.3* 25.6* 26.1* 27.5*  MCV 94.4 94.9 95.5 94.5 93.9 94.8  PLT 466* 463* 481* 420* 534* 550*    Basic Metabolic Panel:  Recent Labs Lab 08/23/16 0650  08/25/16 0609 08/26/16 0701 08/27/16 0800 08/28/16 1300 08/29/16 0612  NA 139  < > 139 142 140 141 136  K 5.4*  < > 4.3 3.9 3.4* 3.7 3.9  CL 114*  < > 113* 115* 112* 112* 108  CO2 18*  < > 17* 19* 20* 21* 16*  GLUCOSE 79  < > 94 104* 116* 142* 98  BUN 15  < > 15 15 14 15 12   CREATININE 1.40*  < > 1.39* 1.56* 1.47* 1.43* 1.28*  CALCIUM 8.0*  < > 8.1* 8.1* 8.1* 8.2* 8.1*  MG 1.7  --   --  1.6*  --   --  1.7  PHOS  --   --   --   --   --   --  3.5  <  > = values in this interval not displayed.  GFR: Estimated Creatinine Clearance: 49.6 mL/min (A) (by C-G formula based on SCr of 1.28 mg/dL (H)).  Liver Function Tests:  Recent Labs Lab 08/29/16 0612  AST 42*  ALT 19  ALKPHOS 122  BILITOT 0.7  PROT 5.7*  ALBUMIN 1.6*   No results for input(s): LIPASE, AMYLASE in the last 168 hours. No results for input(s): AMMONIA in the last 168 hours.  Coagulation Profile:  Recent Labs Lab 08/25/16 0609  INR 1.49    Cardiac Enzymes: No results for input(s): CKTOTAL, CKMB, CKMBINDEX, TROPONINI in the last 168 hours.  BNP (last 3 results) No results for input(s): PROBNP in the last 8760 hours.  HbA1C: No results for input(s): HGBA1C in the last 72 hours.  CBG:  Recent Labs Lab 08/27/16 1156 08/27/16 1648 08/28/16 2238 08/29/16 0729 08/29/16 1140  GLUCAP 124* 113* 95 99 104*    Lipid Profile: No results for input(s):  CHOL, HDL, LDLCALC, TRIG, CHOLHDL, LDLDIRECT in the last 72 hours.  Thyroid Function Tests: No results for input(s): TSH, T4TOTAL, FREET4, T3FREE, THYROIDAB in the last 72 hours.  Anemia Panel: No results for input(s): VITAMINB12, FOLATE, FERRITIN, TIBC, IRON, RETICCTPCT in the last 72 hours.  Urine analysis:    Component Value Date/Time   COLORURINE YELLOW 08/14/2016 2255   APPEARANCEUR CLEAR 08/14/2016 2255   LABSPEC 1.011 08/14/2016 2255   PHURINE 7.0 08/14/2016 2255   GLUCOSEU NEGATIVE 08/14/2016 2255   HGBUR NEGATIVE 08/14/2016 2255   BILIRUBINUR NEGATIVE 08/14/2016 2255   BILIRUBINUR negative 07/27/2016 1311   BILIRUBINUR neg 12/09/2014 1458   KETONESUR NEGATIVE 08/14/2016 2255   PROTEINUR NEGATIVE 08/14/2016 2255   UROBILINOGEN 0.2 07/27/2016 1311   NITRITE NEGATIVE 08/14/2016 2255   LEUKOCYTESUR NEGATIVE 08/14/2016 2255    Sepsis Labs: Lactic Acid, Venous    Component Value Date/Time   LATICACIDVEN 1.2 08/15/2016 0201    MICROBIOLOGY: No results found for this or any previous  visit (from the past 240 hour(s)).  RADIOLOGY STUDIES/RESULTS: Dg Chest 1 View  Result Date: 08/15/2016 CLINICAL DATA:  Status post left thoracentesis. EXAM: CHEST 1 VIEW COMPARISON:  Aug 14, 2016 FINDINGS: The left-sided pleural effusion remains but is smaller. No pneumothorax. No other changes. IMPRESSION: Decreased size of left-sided pleural effusion after thoracentesis. No pneumothorax. Electronically Signed   By: Dorise Bullion III M.D   On: 08/15/2016 14:09   Dg Chest 2 View  Result Date: 08/28/2016 CLINICAL DATA:  Increasing shortness of breath and weakness since being discharged from the hospital yesterday. EXAM: CHEST  2 VIEW COMPARISON:  Single-view of the chest 08/21/2016. FINDINGS: Left worse than right pleural effusions and airspace disease persist. The patient's left pleural effusion has increased somewhat since the prior examination. Pulmonary nodules on the right are identified as seen on prior exams. Known left pulmonary nodules are obscured. No pneumothorax. IMPRESSION: Persistent left greater than right pleural effusions and airspace disease. The patient's left effusion shows some increase since the most recent exam. Bilateral pulmonary nodules as seen on the prior study. Electronically Signed   By: Inge Rise M.D.   On: 08/28/2016 12:11   Dg Chest 2 View  Result Date: 08/14/2016 CLINICAL DATA:  PT c/o SOB upon exertion recently, as well as decrease in appetite. Pt was taken off of chemo pills today due to health reasons. Pt has cancer of right kidney. H/o PNA, Pulmonary Embolism, Type II Diabetes. Former smoker. EXAM: CHEST  2 VIEW COMPARISON:  05/26/2016 FINDINGS: Moderate to large pleural effusion obscures hemidiaphragm and left heart border, significantly increased from the prior exam. No right pleural effusion. No evidence of pneumonia or pulmonary edema. Cardiac silhouette is partly obscured but grossly normal in size. No mediastinal or hilar masses. No convincing  adenopathy. No pneumothorax. Skeletal structures are intact. IMPRESSION: 1. Moderate to large left pleural effusion. This has significantly increased in size from the prior study. 2. No right pleural effusion. No evidence of pneumonia or pulmonary edema. Electronically Signed   By: Lajean Manes M.D.   On: 08/14/2016 18:05   Ct Chest Wo Contrast  Result Date: 08/28/2016 CLINICAL DATA:  Pleural effusion, PleurX tube not draining, worsened shortness of breath with exertion, history of RIGHT renal cancer, hypertension, former emboli EXAM: CT CHEST WITHOUT CONTRAST TECHNIQUE: Multidetector CT imaging of the chest was performed following the standard protocol without IV contrast. Sagittal and coronal MPR images reconstructed from axial data set. COMPARISON:  08/16/2016 FINDINGS: Cardiovascular:  Atherosclerotic calcifications aorta and coronary arteries. Minimal pericardial effusion. Mediastinum/Nodes: Mediastinal adenopathy slightly increased since previous exam. AP window node 21 mm short axis image 51 previously 18 mm. RIGHT paratracheal node 14 mm short axis image 42 previously 10 mm. Subcarinal node 15 mm short axis image 65 previously 11 mm. Esophagus unremarkable. Base of cervical region normal appearance. Lungs/Pleura: PleurX catheter coiled in the inferolateral LEFT hemithorax. Increased LEFT pleural effusion since previous exam, partially loculated. Multiple septations are identified as well as larger more rounded areas of opacification within the pleural effusion likely representing malignant pleural deposits. Increased atelectasis of LEFT upper and LEFT lower lobes. Pulmonary metastases again visualized, largest RIGHT mid lung 11 mm short axis image 44 and 14 mm LEFT apex image 24. No pneumothorax. Upper Abdomen: Complex cystic and solid mass consistent with known neoplasm at the upper LEFT kidney, 8.9 x 4.6 cm image 155. Enlarged LEFT adrenal gland 2.4 x 1.3 cm image 134 little changed. Pancreas appears  mildly enlarged and slightly ill-defined at the pancreatic body unchanged since the previous study suggesting pancreatitis. No additional upper abdominal abnormalities. Surgically absent RIGHT kidney. Thickening of RIGHT adrenal gland without discrete mass. Musculoskeletal: No osseous metastases. IMPRESSION: Large malignant LEFT pleural effusion with septations and tumor nodularity, increased in size since previous exam. Moderate RIGHT pleural effusion, increased since previous exam. Pulmonary metastases again identified with increased compressive atelectasis of both lungs. Increased sizes of mediastinal adenopathy. Complex cystic and solid LEFT renal neoplasm and probable LEFT adrenal metastasis. Suspected pancreatitis changes again seen. Aortic atherosclerosis and coronary arterial calcification with minimal pericardial effusion. Electronically Signed   By: Lavonia Dana M.D.   On: 08/28/2016 15:13   Ct Chest Wo Contrast  Result Date: 08/16/2016 CLINICAL DATA:  Drop in hemoglobin post thoracentesis. EXAM: CT CHEST WITHOUT CONTRAST TECHNIQUE: Multidetector CT imaging of the chest was performed following the standard protocol without IV contrast. COMPARISON:  06/17/2016 FINDINGS: Cardiovascular: Extensive aortic and coronary atheromatous calcifications. No aneurysm. Small pericardial effusion. Mediastinum/Nodes: 18 mm enlarging left suprahilar node, previously 14 mm. 1 cm pretracheal node, previously 7 mm. No axillary adenopathy. Lungs/Pleura: No pneumothorax. Small layering right pleural effusion. Moderately large hyperdense residual/recurrent left pleural effusion probably hemorrhagic. compressive atelectasis throughout the basilar segments of left lower lobe and inferior lingula. Bilateral pulmonary nodules with slight progression. Index right lower lobe nodule 19 mm image 99/5, previously 17 mm. Posterior 13 mm left upper lobe nodule image 50/5, previously 11 mm by my measurement. Additional smaller nodules  bilaterally, several cavitary. Upper Abdomen: Previous right nephrectomy. Mixed attenuation left renal mass, incompletely visualized. Progressive inflammatory/edematous changes around pancreatic head and body, incompletely seen. Stable left adrenal enlargement. Musculoskeletal: Negative for fracture. IMPRESSION: 1. Moderately large  residual/recurrent hemorrhagic left effusion. 2. Progression of bilateral pulmonary nodules and mediastinal adenopathy. 3. Small pericardial and right pleural effusions. 4. New peripancreatic inflammatory/edematous changes suggesting pancreatitis. 5. Aortic and coronary atherosclerosis. 6. Left renal and left adrenal masses again noted. Electronically Signed   By: Lucrezia Europe M.D.   On: 08/16/2016 10:46   Ir Guided Niel Hummer W Catheter Placement  Result Date: 08/25/2016 INDICATION: Recurrent malignant left pleural effusion EXAM: ULTRASOUND AND FLUOROSCOPIC LEFT TUNNELED PLEURAL DRAIN (PLEURX CATHETER) MEDICATIONS: Ancef 2 g, administered within 1 hour of the procedure ANESTHESIA/SEDATION: Fentanyl 100 mcg IV; Versed 2.0 mg IV Moderate Sedation Time:  20 minutes The patient was continuously monitored during the procedure by the interventional radiology nurse under my direct supervision. COMPLICATIONS: None immediate. PROCEDURE: Informed written  consent was obtained from the patient after a thorough discussion of the procedural risks, benefits and alternatives. All questions were addressed. Maximal Sterile Barrier Technique was utilized including caps, mask, sterile gowns, sterile gloves, sterile drape, hand hygiene and skin antiseptic. A timeout was performed prior to the initiation of the procedure. Previous imaging reviewed. Right lower chest marked in the mid axillary line through a lower intercostal space. Under sterile conditions and local anesthesia, ultrasound percutaneous access performed of the left pleural space. Needle position confirmed with ultrasound. Images obtained for  documentation. There was return of bloody pleural fluid. Amplatz guidewire inserted. In the adjacent soft tissues, a subcutaneous tunnel was created. The PleurX catheter was tunneled subcutaneously to the puncture site and advanced into the left chest through the valved pleural sheath. Position confirmed with fluoroscopy. Thoracentesis performed yielding 5 minutes 50 cc bloody pleural fluid. Catheter secured with a Prolene suture. Entry site closed with subcuticular Vicryl suture and derma bond. Sterile dressing applied. No immediate complication. Patient tolerated the procedure well. IMPRESSION: Successful ultrasound and fluoroscopic 15.5 French left tunneled pleural drain (PleurX catheter). 550 cc left thoracentesis performed after insertion. Electronically Signed   By: Jerilynn Mages.  Shick M.D.   On: 08/25/2016 10:44   Dg Chest Port 1 View  Result Date: 08/21/2016 CLINICAL DATA:  Post thoracentesis EXAM: PORTABLE CHEST 1 VIEW COMPARISON:  08/21/2016 FINDINGS: Decreased left pleural effusion with moderate residual. Improved aeration of left upper lobe. No definitive pneumothorax. Small right pleural effusion. Persistent bibasilar left greater than right atelectasis or pneumonia. Mild cardiomegaly. Aortic atherosclerosis. IMPRESSION: 1. Decreased pleural effusion on the left, no pneumothorax is seen. Moderate residual left pleural effusion. 2. Otherwise no change in small right pleural effusion and dense left greater than right bilateral lower lung consolidations which may reflect atelectasis or pneumonia Electronically Signed   By: Donavan Foil M.D.   On: 08/21/2016 14:43   Dg Chest Port 1 View  Result Date: 08/21/2016 CLINICAL DATA:  Dyspnea EXAM: PORTABLE CHEST 1 VIEW COMPARISON:  08/19/2016 chest radiograph. FINDINGS: Stable cardiomediastinal silhouette with mild cardiomegaly and aortic atherosclerosis. No pneumothorax. Small right pleural effusion appears slightly increased. Large left pleural effusion appears  stable. Stable near complete opacification of the left lung with minimal preserved aeration in the left upper lobe. Worsening patchy right lung base opacity. IMPRESSION: 1. Stable large left pleural effusion. Increased small right pleural effusion . 2. Stable near complete left lung opacification with minimal preserved aeration in the left upper lobe. Worsening patchy right lung base opacity. Findings could represent atelectasis, with pneumonia or aspiration not excluded. 3. Stable cardiomegaly. 4. Aortic atherosclerosis. Electronically Signed   By: Ilona Sorrel M.D.   On: 08/21/2016 11:53   Dg Chest Port 1 View  Result Date: 08/19/2016 CLINICAL DATA:  Post left thoracentesis EXAM: PORTABLE CHEST 1 VIEW COMPARISON:  08/19/2016 FINDINGS: Continued large left pleural effusion, slightly decreased since prior study with slight increase in aeration of the left lung. No pneumothorax. Small right pleural effusion with right lower lobe atelectasis or infiltrate is stable. IMPRESSION: Slight decreased size of the large left pleural effusion following thoracentesis. No pneumothorax. Otherwise no change. Electronically Signed   By: Rolm Baptise M.D.   On: 08/19/2016 11:46   Dg Chest Port 1 View  Result Date: 08/19/2016 CLINICAL DATA:  Pleural effusion. EXAM: PORTABLE CHEST 1 VIEW COMPARISON:  08/18/2016.  CT 08/16/2016. FINDINGS: Heart size stable. Persistent large left pleural effusion, unchanged from prior exam. Small right pleural effusion again noted  unchanged. Basilar atelectasis. Pulmonary nodules present best identified by prior CT. No pneumothorax. IMPRESSION: 1. Stable chest with large left pleural effusion and small right pleural effusion. 2. Basilar atelectasis. Pulmonary nodules present best identified by prior CT of 05/ 20/2018 . Electronically Signed   By: Marcello Moores  Register   On: 08/19/2016 07:16   Dg Chest Port 1 View  Result Date: 08/18/2016 CLINICAL DATA:  Pleural effusion. EXAM: PORTABLE CHEST 1  VIEW COMPARISON:  08/17/2016.  CT 08/16/2016. FINDINGS: Heart size stable Persistent large left pleural effusion, unchanged from prior exam. Small right pleural effusion again noted without interim change . Bibasilar atelectasis. Pulmonary nodules best identified by prior CT of 08/16/2016 . No pneumothorax. IMPRESSION: 1. Persistent large left pleural effusion. Small right pleural effusion. 2. Bibasilar atelectasis. Pulmonary nodules best identified by prior CT of 08/16/2016. Electronically Signed   By: Marcello Moores  Register   On: 08/18/2016 06:56   Dg Chest Port 1 View  Result Date: 08/17/2016 CLINICAL DATA:  66 year old female left-sided pleural effusion. Subsequent encounter. EXAM: PORTABLE CHEST 1 VIEW COMPARISON:  08/16/2016 CT and chest x-ray. FINDINGS: Large left-sided and small right-sided pleural effusion. Almost complete opacification left hemithorax. Left-sided pleural effusion has increased in size since prior exam. CT detected pulmonary nodules and adenopathy not as well delineated on present plain film exam. IMPRESSION: Increase in size of large left-sided pleural effusion. Almost complete opacification left hemithorax. Small right-sided pleural effusion. Electronically Signed   By: Genia Del M.D.   On: 08/17/2016 07:21   Dg Chest Port 1 View  Result Date: 08/16/2016 CLINICAL DATA:  Pleural effusion EXAM: PORTABLE CHEST 1 VIEW COMPARISON:  08/15/2016 FINDINGS: Moderate left pleural effusion again noted with left lower lobe atelectasis. Increasing left upper lobe airspace disease. Right basilar atelectasis or infiltrate. Mild cardiomegaly. IMPRESSION: Continued moderate left pleural effusion with left lower lobe atelectasis or infiltrate and right basilar opacity. Increasing left upper lobe airspace opacity. Electronically Signed   By: Rolm Baptise M.D.   On: 08/16/2016 08:43   US Thoracentesis Asp Pleural Space W/img Guide  Result Date: 08/21/2016 INDICATION: Metastatic renal cell carcinoma,  dyspnea, recurrent left pleural effusion; request made for therapeutic left thoracentesis. EXAM: ULTRASOUND GUIDED THERAPEUTIC LEFT THORACENTESIS MEDICATIONS: None. COMPLICATIONS: None immediate. PROCEDURE: An ultrasound guided thoracentesis was thoroughly discussed with the patient and questions answered. The benefits, risks, alternatives and complications were also discussed. The patient understands and wishes to proceed with the procedure. Written consent was obtained. Ultrasound was performed to localize and mark an adequate pocket of fluid in the left chest. The area was then prepped and draped in the normal sterile fashion. 1% Lidocaine was used for local anesthesia. Under ultrasound guidance a Safe-T-Centesis catheter was introduced. Thoracentesis was performed. The catheter was removed and a dressing applied. FINDINGS: A total of approximately 1.5 liters of bloody fluid was removed. Only the above amount of fluid was removed at this time secondary to persistent patient coughing and chest discomfort. IMPRESSION: Successful ultrasound guided therapeutic left thoracentesis yielding 1.5 liters of pleural fluid. Read by: Rowe Robert, PA-C Electronically Signed   By: Sandi Mariscal M.D.   On: 08/21/2016 14:43   US Thoracentesis Asp Pleural Space W/img Guide  Result Date: 08/15/2016 INDICATION: SOB upon exertion. Cancer of the right kidney. Left pleural effusion. EXAM: ULTRASOUND GUIDED LEFT THORACENTESIS MEDICATIONS: 1% Lidocaine = 10 mL. COMPLICATIONS: None immediate. PROCEDURE: An ultrasound guided thoracentesis was thoroughly discussed with the patient and questions answered. The benefits, risks, alternatives and complications were  also discussed. The patient understands and wishes to proceed with the procedure. Written consent was obtained. Ultrasound was performed to localize and mark an adequate pocket of fluid in the left chest. The area was then prepped and draped in the normal sterile fashion. 1%  Lidocaine was used for local anesthesia. Under ultrasound guidance a catheter was introduced. Thoracentesis was performed. The catheter was removed and a dressing applied. FINDINGS: A total of approximately 2 liters of bloody fluid was removed. Samples were sent to the laboratory as requested by the clinical team. IMPRESSION: Successful ultrasound guided left thoracentesis yielding 2 liters of pleural fluid. Post procedure radiograph shows no pneumothorax. Read by:  Gareth Eagle, PA-C Electronically Signed   By: Lucrezia Europe M.D.   On: 08/15/2016 13:52     LOS: 1 day   Oren Binet, MD  [facility] Pager:[phone]  If 7PM-7AM, please contact night-coverage [email or web address] Password [email or web address] 08/29/2016, 1:52 PM

## 2016-08-29 NOTE — Consult Note (Signed)
Consultation Note Date: 08/29/2016   Patient Name: Laura Neal  DOB: 07/30/1950  MRN: [id number]  Age / Sex: 66 y.o., female  PCP: Bernerd Limbo, MD Referring Physician: Jonetta Osgood, MD  Reason for Consultation: Establishing goals of care and Psychosocial/spiritual support  HPI/Patient Profile: 66 y.o. female  with past medical history of Arthritis, GERD, peptic ulcer disease, hypertension, hyperlipidemia, diabetes type 2, hip replacement, renal cell carcinoma with right nephrectomy in 2000 admitted on 08/28/2016 with worsening shortness of breath. Patient has had a recurrence of her renal cell carcinoma in March 2017 when she presented with a large mass on her left kidney. 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 Patient underwent left total hip replacement in January 2018. One month later she developed a deep vein thrombosis and pulmonary emboli and was anticoagulated on Eliquis. She presented to [facility] long hospital on 08/14/2016 for nausea or vomiting abdominal pain and shortness of breath. She was found to be anemic, dehydrated, and in acute renal failure. She also was found to have large bilateral pleural effusions, left greater than right. She underwent thoracentesis on 08/15/2016 yielding 2 L of bloody fluid. All blood thinners and chemotherapy were stopped. Cardiothoracic surgical consultation was requested and patient had a guided Pleurx catheter placed on the left. The catheter was placed on 08/25/2016 and at that time 500 mL's of bloody fluid were removed. Since that time the catheter has not been draining, and the left pleural effusion essentially remained unchanged. She was discharged home on 08/27/2016 but returned to the emergency department on 08/28/2016 secondary to persistent shortness of breath. Cytology from thoracentesis confirmed that these were  malignant cells with primary being renal cell carcinoma  Clinical Assessment and Goals of Care: Met with patient, her mother, and her 6 sisters as well as Dr. Ricard Dillon from cardiac and thoracic surgery. Extensive education provided by Dr. Ricard Dillon to patient and family regarding her malignant pleural effusions. He discussed how any surgical interventions would Mauri Pole and risk than benefit. Her Pleurx catheter is still not draining and it is thought to be at this point from tissue, or blood clot occlusion. Family appears very blindsided by all this information with 2 of the sisters remarking "we were told that the tumor was shrinking".  Introduction the palliative care services was also part of our meeting today as well as establishing a report with patient and family. Patient was unaware of the meaning behind the terms for code and DO NOT RESUSCITATE which we also reviewed.  Patient at this point is able to speak for herself regarding her wishes going forward; otherwise family makes decisions as a whole. Patient's mother is alive but she is elderly and frail and the majority of input will likely be from her sister's particularly her sister that works in the oncology department, and pharmacy at [employer] full code for now. Patient was quite overwhelmed at the end of  meeting both with myself and Dr. Ricard Dillon. Plan to meet with patient on 08/30/2016 again to discuss goals of care and specifically CODE STATUS Would also recommend hospice for this patient going forward and we'll include this in tomorrow's meeting Code Status/Advance Care Planning:  Full code  Palliative Prophylaxis:   Aspiration, Bowel Regimen, Delirium Protocol, Frequent Pain Assessment, Oral Care and Turn Reposition  Additional Recommendations (Limitations, Scope, Preferences):  Full Scope Treatment  Psycho-social/Spiritual:   Desire for further Chaplaincy  support:no  Additional Recommendations: Grief/Bereavement Support  Prognosis:   < 4 weeks in the setting of malignant renal cell carcinoma, now with bilateral malignant pleural effusions, shortness of breath at rest. Patient is at high risk for acute respiratory failure  Discharge Planning: To Be Determined      Primary Diagnoses: Present on Admission: . Type 2 diabetes mellitus with stage 3 chronic kidney disease, without long-term current use of insulin ([place]) . Shortness of breath . Renal cell carcinoma of right kidney ([place]) . Moderate protein-calorie malnutrition ([place]) . Malignant neoplasm metastatic to left lung ([place]) . Pleural effusion . Pleural effusion on left . Malignant pleural effusion   I have reviewed the medical record, interviewed the patient and family, and examined the patient. The following aspects are pertinent.  Past Medical History:  Diagnosis Date  . Allergy   . Anemia   . Arthritis    "it was probably in my right hip" (05/26/2016)  . Cancer of right kidney ([place]) 2000   on PO chemotherapy /notes 05/26/2016  . GERD (gastroesophageal reflux disease)   . History of blood transfusion 03/2016   "w/hip replacement"  . History of stomach ulcers 1970s  . Hyperlipidemia   . Hypertension   . Pneumonia    "it's been awhile" (05/26/2016)  . Pulmonary embolism ([place]) 05/26/2016  . Seasonal allergies   . Seizures ([place])    "when I was young" (05/26/2016), none since childhood 07-08-16  . Type II diabetes mellitus ([place])   . Ulcer    Social History   Social History  . Marital status: Widowed    Spouse name: N/A  . Number of children: N/A  . Years of education: N/A   Social History Main Topics  . Smoking status: Former Smoker    Packs/day: 0.50    Years: 40.00    Types: Cigarettes    Quit date: 03/30/2004  . Smokeless tobacco: Former Systems developer    Types: Chew, Maurice: 05/26/2016 "tried chew and snuff when I was young"  . Alcohol use No  . Drug use: No   . Sexual activity: No   Other Topics Concern  . None   Social History Narrative   ** Merged History Encounter **       Family History  Problem Relation Age of Onset  . Hypertension Mother   . Hypertension Father   . Diabetes Sister   . Cancer Sister   . Diabetes Brother   . Hypertension Brother   . Colon cancer Neg Hx   . Esophageal cancer Neg Hx   . Stomach cancer Neg Hx   . Rectal cancer Neg Hx    Scheduled Meds: . aspirin EC  81 mg Oral Daily  . insulin aspart  0-5 Units Subcutaneous QHS  . insulin aspart  0-9 Units Subcutaneous TID WC  . insulin glargine  15 Units Subcutaneous Daily  . mirtazapine  15 mg Oral QHS  . pantoprazole  40 mg Oral Daily  .  rosuvastatin  10 mg Oral QHS  . sodium chloride flush  3 mL Intravenous Q12H  . sodium chloride flush  3 mL Intravenous Q12H   Continuous Infusions: . sodium chloride     PRN Meds:.sodium chloride, acetaminophen **OR** acetaminophen, ondansetron **OR** ondansetron (ZOFRAN) IV, oxyCODONE, polyethylene glycol, sodium chloride flush, traMADol Medications Prior to Admission:  Prior to Admission medications   Medication Sig Start Date End Date Taking? Authorizing Provider  aspirin EC 81 MG tablet Take 81 mg by mouth daily.   Yes [provider]  insulin glargine (LANTUS) 100 UNIT/ML injection Inject 0.15 mLs (15 Units total) into the skin daily. 08/28/16  Yes Mariel Aloe, MD  methocarbamol (ROBAXIN) 500 MG tablet Take 1 tablet (500 mg total) by mouth every 6 (six) hours as needed for muscle spasms. 04/27/16  Yes Perkins, Alexzandrew L, PA-C  mirtazapine (REMERON) 15 MG tablet Take 1 tablet (15 mg total) by mouth at bedtime. 07/28/16  Yes Wyatt Portela, MD  pantoprazole (PROTONIX) 40 MG tablet Take 1 tablet (40 mg total) by mouth daily. Patient taking differently: Take 40 mg by mouth every other day.  06/16/16  Yes Esterwood, Amy S, PA-C  rosuvastatin (CRESTOR) 10 MG tablet Take 10 mg by mouth at bedtime.  11/18/15   Yes [provider]  traMADol (ULTRAM) 50 MG tablet Take 1-2 tablets (50-100 mg total) by mouth every 6 (six) hours as needed (mild pain). 04/27/16  Yes Perkins, Alexzandrew L, PA-C   Allergies  Allergen Reactions  . Codeine Rash    Pt has tolerated oxycodone in the past   Review of Systems  Unable to perform ROS: Severe respiratory distress    Physical Exam  Constitutional: She is oriented to person, place, and time. She appears well-developed and well-nourished.  Neck: Normal range of motion.  Pulmonary/Chest:  Increased work of breathing  Abdominal: Soft.  Musculoskeletal: Normal range of motion.  Neurological: She is alert and oriented to person, place, and time.  Skin: Skin is warm and dry.  Psychiatric:  Affect constricted; appears sad and frightened  Nursing note and vitals reviewed.   Vital Signs: BP 116/60   Pulse 100   Temp 98.3 F (36.8 C)   Resp 16   Ht _0  (1.727 m)   Wt 88.1 kg (194 lb 3.2 oz)   SpO2 98%   BMI 29.53 kg/m  Pain Assessment: No/denies pain POSS *See Group Information*: 1-Acceptable,Awake and alert Pain Score: 0-No pain   SpO2: SpO2: 98 % O2 Device:SpO2: 98 % O2 Flow Rate: .O2 Flow Rate (L/min): 2 L/min  IO: Intake/output summary:  Intake/Output Summary (Last 24 hours) at 08/29/16 1143 Last data filed at 08/29/16 0730  Gross per 24 hour  Intake              360 ml  Output              200 ml  Net              160 ml    LBM: Last BM Date: 08/27/16 Baseline Weight: Weight: 87.6 kg (193 lb 3.2 oz) Most recent weight: Weight: 88.1 kg (194 lb 3.2 oz)     Palliative Assessment/Data:   Flowsheet Rows     Most Recent Value  Intake Tab  Referral Department  Hospitalist  Unit at Time of Referral  Med/Surg Unit  Palliative Care Primary Diagnosis  Pulmonary  Date Notified  08/28/16  Palliative Care Type  New Palliative care  Reason for referral  Pain, Non-pain Symptom, Clarify Goals of Care  Date of Admission  08/28/16   Date first seen by Palliative Care  08/29/16  # of days Palliative referral response time  1 Day(s)  # of days IP prior to Palliative referral  0  Clinical Assessment  Palliative Performance Scale Score  40%  Pain Max last 24 hours  Not able to report  Pain Min Last 24 hours  Not able to report  Dyspnea Max Last 24 Hours  Not able to report  Dyspnea Min Last 24 hours  Not able to report  Nausea Max Last 24 Hours  Not able to report  Nausea Min Last 24 Hours  Not able to report  Anxiety Max Last 24 Hours  Not able to report  Anxiety Min Last 24 Hours  Not able to report  Other Max Last 24 Hours  Not able to report  Psychosocial & Spiritual Assessment  Palliative Care Outcomes  Patient/Family meeting held?  Yes      Time In: 1030 Time Out: 1145 Time Total: 75 min Greater than 50%  of this time was spent counseling and coordinating care related to the above assessment and plan. Staffed with Dr. Sloan Leiter  Signed by: Dory Horn, NP   Please contact Palliative Medicine Team phone at [phone] for questions and concerns.  For individual provider: See Shea Evans

## 2016-08-29 NOTE — Consult Note (Addendum)
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  The patient has remote history of renal cell cancer treated with right nephrectomy in 2000. She did very well and remained cancer free until March 2017 when she presented with a large mass in the left kidney. She was diagnosed with metastatic disease in the lung and adrenal glands, biopsy-proven renal cell carcinoma. She has been treated using Votrient by Dr. Alen Blew who last saw her in the oncology clinic on 07/28/2016.  In January 2018 she underwent left total hip replacement. She states that she has never really recovered since then. One month later she developed deep vein thrombosis and pulmonary embolism.  She was anticoagulated using Eliquis.  She presented to [facility] 08/14/2016 for nausea, vomiting, abdominal pain, and shortness of breath. On admission she was anemic and dehydrated in acute renal failure with serum creatinine 2.9 and lactic acid level 2.2. She was found to have large bilateral pleural effusions, left much greater than right.  She underwent thoracentesis 08/15/2016 yielding 2 L of bloody fluid. Pleural fluid cytology was "suspicious for malignant cells". The patient's hemoglobin dropped following thoracentesis requiring blood transfusion. All blood thinners and chemotherapy were stopped. Cardiothoracic surgical consultation was requested and the patient's radiographic studies were reviewed by Dr. Servando Snare who recommended interventional radiology guided Pleurx catheter  placement.  While the patient was waiting for Pleurx catheter placement to be performed she underwent repeat ultrasound guided thoracentesis on 08/21/2016 yielding 1.5 L of bloody fluid. Pleurx catheter was performed 08/25/2016. A total of 500 mL were removed at that time. Ever since then the patient has remained short of breath, the Pleurx catheter has not been draining much fluid, and portable chest x-rays revealed only modest decrease in the size of the left pleural effusion.[name] Despite this the patient was discharged from the hospital 08/27/2016.  She returned to the emergency department yesterday evening because of persistent shortness of breath.  Repeat chest CT scan was performed revealing persistent large left pleural effusion with septations and tumor nodularity, moderate right-sided pleural effusion, increased from previous exam, multiple pulmonary metastases with compressive atelectasis of both lungs, and increased sizes of mediastinal adenopathy.  A CT scan was not performed with intravenous contrast to evaluate whether not the patient might have pulmonary embolus. There remained complex cystic and solid left renal neoplasm with left adrenal metastases and findings suspicious for pancreatitis, all stable from previous exams. Repeat cardiothoracic surgical consultation was requested and the patient was admitted to [facility] for further management.  The patient lives locally in [place] with her 82 year old mother. She has several sisters who live nearby and are supportive. She has been chronically short of breath for several weeks. She is very weak and frail and states that she has never really recovered since she underwent hip replacement in January. She is able to ambulate short distances using a walker for assistance. She has a very poor appetite and feels weak. She has had a  productive cough without hemoptysis. She denies any significant pain. Her primary complaint is that of  resting shortness of breath, for which she has had no relief since she was originally admitted to [facility] on 08/14/2016. She states that she has not CODE STATUS with any of her providers, specifically she has not been asked whether not she would wish to have CPR, undergo cardioversion, or be intubated and placed on mechanical ventilatory support in the event of sudden cardiac or respiratory arrest. She states that she has not had any discussions regarding long-term prognosis of her underlying malignancy with any of her physicians.    Past Medical History:  Diagnosis Date  . Allergy   . Anemia   . Arthritis    "it was probably in my right hip" (05/26/2016)  . Cancer of right kidney ([place]) 2000   on PO chemotherapy /notes 05/26/2016  . GERD (gastroesophageal reflux disease)   . History of blood transfusion 03/2016   "w/hip replacement"  . History of stomach ulcers 1970s  . Hyperlipidemia   . Hypertension   . Pneumonia    "it's been awhile" (05/26/2016)  . Pulmonary embolism ([place]) 05/26/2016  . Seasonal allergies   . Seizures ([place])    "when I was young" (05/26/2016), none since childhood 07-08-16  . Type II diabetes mellitus ([place])   . Ulcer     Past Surgical History:  Procedure Laterality Date  . ABDOMINAL HYSTERECTOMY  1987   "left an ovary"  . BACK SURGERY    . COLONOSCOPY    . ECTOPIC PREGNANCY SURGERY    . IR GUIDED DRAIN W CATHETER PLACEMENT  08/25/2016  . JOINT REPLACEMENT    . [place] [facility]  1990s  . NEPHRECTOMY Right 2000   for cancer  . TOTAL HIP ARTHROPLASTY Right 04/22/2016   Procedure: RIGHT TOTAL HIP ARTHROPLASTY ANTERIOR APPROACH;  Surgeon: Gaynelle Arabian, MD;  Location: [facility];  Service: Orthopedics;  Laterality: Right;  . UPPER GASTROINTESTINAL ENDOSCOPY      Family History  Problem Relation Age of Onset  . Hypertension Mother   . Hypertension Father   . Diabetes Sister   . Cancer Sister   . Diabetes Brother   . Hypertension Brother   . Colon cancer  Neg Hx   . Esophageal cancer Neg Hx   . Stomach cancer Neg Hx   . Rectal cancer Neg Hx     Social History   Social History  . Marital status: Widowed    Spouse name: N/A  . Number of children: N/A  . Years of education: N/A   Occupational History  . Not on file.   Social History Main Topics  . Smoking status: Former Smoker    Packs/day: 0.50    Years: 40.00    Types: Cigarettes    Quit date: 03/30/2004  . Smokeless tobacco: Former Systems developer    Types: Chew, Stem: 05/26/2016 "tried chew and snuff when I was young"  . Alcohol use No  . Drug use: No  . Sexual activity: No   Other Topics Concern  . Not on file   Social History Narrative   ** Merged History Encounter **        Prior to Admission medications   Medication Sig Start Date End Date Taking? Authorizing Provider  aspirin EC 81 MG tablet Take 81 mg by mouth daily.   Yes [provider]  insulin glargine (LANTUS) 100 UNIT/ML injection Inject 0.15 mLs (15  Units total) into the skin daily. 08/28/16  Yes Mariel Aloe, MD  methocarbamol (ROBAXIN) 500 MG tablet Take 1 tablet (500 mg total) by mouth every 6 (six) hours as needed for muscle spasms. 04/27/16  Yes Perkins, Alexzandrew L, PA-C  mirtazapine (REMERON) 15 MG tablet Take 1 tablet (15 mg total) by mouth at bedtime. 07/28/16  Yes Wyatt Portela, MD  pantoprazole (PROTONIX) 40 MG tablet Take 1 tablet (40 mg total) by mouth daily. Patient taking differently: Take 40 mg by mouth every other day.  06/16/16  Yes Esterwood, Amy S, PA-C  rosuvastatin (CRESTOR) 10 MG tablet Take 10 mg by mouth at bedtime.  11/18/15  Yes [provider]  traMADol (ULTRAM) 50 MG tablet Take 1-2 tablets (50-100 mg total) by mouth every 6 (six) hours as needed (mild pain). 04/27/16  Yes Perkins, Alexzandrew L, PA-C    Current Facility-Administered Medications  Medication Dose Route Frequency Provider Last Rate Last Dose  . 0.9 %  sodium chloride infusion  250 mL  Intravenous PRN Barton Dubois, MD      . acetaminophen (TYLENOL) tablet 650 mg  650 mg Oral Q6H PRN Barton Dubois, MD       Or  . acetaminophen (TYLENOL) suppository 650 mg  650 mg Rectal Q6H PRN Barton Dubois, MD      . aspirin EC tablet 81 mg  81 mg Oral Daily Barton Dubois, MD      . insulin aspart (novoLOG) injection 0-5 Units  0-5 Units Subcutaneous QHS Barton Dubois, MD      . insulin aspart (novoLOG) injection 0-9 Units  0-9 Units Subcutaneous TID WC Barton Dubois, MD      . insulin glargine (LANTUS) injection 15 Units  15 Units Subcutaneous Daily Barton Dubois, MD      . mirtazapine (REMERON) tablet 15 mg  15 mg Oral QHS Barton Dubois, MD   15 mg at 08/28/16 2246  . ondansetron (ZOFRAN) tablet 4 mg  4 mg Oral Q6H PRN Barton Dubois, MD       Or  . ondansetron [facility]) injection 4 mg  4 mg Intravenous Q6H PRN Barton Dubois, MD      . oxyCODONE (Oxy IR/ROXICODONE) immediate release tablet 5 mg  5 mg Oral Q6H PRN Barton Dubois, MD      . pantoprazole (PROTONIX) EC tablet 40 mg  40 mg Oral Daily Barton Dubois, MD      . polyethylene glycol (MIRALAX / GLYCOLAX) packet 17 g  17 g Oral Daily PRN Barton Dubois, MD      . rosuvastatin (CRESTOR) tablet 10 mg  10 mg Oral QHS Barton Dubois, MD   10 mg at 08/28/16 2246  . sodium chloride flush (NS) 0.9 % injection 3 mL  3 mL Intravenous Q12H Barton Dubois, MD   3 mL at 08/28/16 2230  . sodium chloride flush (NS) 0.9 % injection 3 mL  3 mL Intravenous Q12H Barton Dubois, MD      . sodium chloride flush (NS) 0.9 % injection 3 mL  3 mL Intravenous PRN Barton Dubois, MD      . traMADol Veatrice Bourbon) tablet 50-100 mg  50-100 mg Oral Q6H PRN Barton Dubois, MD        Allergies  Allergen Reactions  . Codeine Rash    Pt has tolerated oxycodone in the past      Review of Systems:  Per HPI Remainder non-contributory     Physical Exam:   BP 116/60  Pulse 100   Temp 98.3 F (36.8 C)   Resp 16   Ht _0  (1.727 m)   Wt 194 lb  3.2 oz (88.1 kg)   SpO2 98%   BMI 29.53 kg/m   General:  Frail and chronically ill-appearing, mildly short of breath at rest  HEENT:  Unremarkable   Neck:   no JVD, no bruits, no adenopathy   Chest:   diminished breath sounds L>R, + wheezes, + rhonchi   CV:   RRR, no murmur   Abdomen:  soft, non-tender, no masses   Extremities:  warm, well-perfused, pulses not palpable, no lower extremity edema  Rectal/GU  Deferred  Neuro:   Grossly non-focal and symmetrical throughout  Skin:   Clean and dry, no rashes, no breakdown  Diagnostic Tests:  CT CHEST WITHOUT CONTRAST  TECHNIQUE: Multidetector CT imaging of the chest was performed following the standard protocol without IV contrast. Sagittal and coronal MPR images reconstructed from axial data set.  COMPARISON:  08/16/2016  FINDINGS: Cardiovascular: Atherosclerotic calcifications aorta and coronary arteries. Minimal pericardial effusion.  Mediastinum/Nodes: Mediastinal adenopathy slightly increased since previous exam. AP window node 21 mm short axis image 51 previously 18 mm. RIGHT paratracheal node 14 mm short axis image 42 previously 10 mm. Subcarinal node 15 mm short axis image 65 previously 11 mm. Esophagus unremarkable. Base of cervical region normal appearance.  Lungs/Pleura: PleurX catheter coiled in the inferolateral LEFT hemithorax. Increased LEFT pleural effusion since previous exam, partially loculated. Multiple septations are identified as well as larger more rounded areas of opacification within the pleural effusion likely representing malignant pleural deposits. Increased atelectasis of LEFT upper and LEFT lower lobes. Pulmonary metastases again visualized, largest RIGHT mid lung 11 mm short axis image 44 and 14 mm LEFT apex image 24. No pneumothorax.  Upper Abdomen: Complex cystic and solid mass consistent with known neoplasm at the upper LEFT kidney, 8.9 x 4.6 cm image 155. Enlarged LEFT adrenal gland  2.4 x 1.3 cm image 134 little changed. Pancreas appears mildly enlarged and slightly ill-defined at the pancreatic body unchanged since the previous study suggesting pancreatitis. No additional upper abdominal abnormalities. Surgically absent RIGHT kidney. Thickening of RIGHT adrenal gland without discrete mass.  Musculoskeletal: No osseous metastases.  IMPRESSION: Large malignant LEFT pleural effusion with septations and tumor nodularity, increased in size since previous exam.  Moderate RIGHT pleural effusion, increased since previous exam.  Pulmonary metastases again identified with increased compressive atelectasis of both lungs.  Increased sizes of mediastinal adenopathy.  Complex cystic and solid LEFT renal neoplasm and probable LEFT adrenal metastasis.  Suspected pancreatitis changes again seen.  Aortic atherosclerosis and coronary arterial calcification with minimal pericardial effusion.   Electronically Signed   By: Lavonia Dana M.D.   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 I have personally reviewed the patient's medical record and multiple recent chest radiographs and CT scans. The Pleurx catheter placed last week appears to be in good position but is clearly not functional, occluded due to the presence of significant clot and/or thick exudative loculations related to the patient's underlying malignancy. Options include long-term palliative medical therapy versus left VATS and possible thoracotomy for drainage and possible decortication with or without pleurodesis. Risks associated with surgery would be exceptionally high with particular concerns for the possibility that the patient would develop ventilator-dependent respiratory  failure, bleeding, and/or recurrent pulmonary  embolus.  The patient's overall long-term prognosis is dismal and there is no question that surgery will not prolong the patient's life.  I would be very reluctant to consider this patient a candidate for aggressive treatment at this time.    Plan:  I discussed matters at length with the patient at the bedside this morning. I have offered to meet with the patient and other family members later today or tomorrow as well to discuss options further. I would recommend getting the patient's primary oncologist on board to have more forthright discussions regarding the patient's underlying advanced malignancy and long-term prognosis. CODE STATUS should be addressed definitively. Palliative Care consultation might be appropriate. The patient expresses some relief to have her complex problems explained and seems to understand the circumstances.  All of her questions have been addressed.   I spent in excess of 90 minutes during the conduct of this hospital consultation and >50% of this time involved direct face-to-face encounter for counseling and/or coordination of the patient's care.    Valentina Gu. Roxy Manns, MD 08/29/2016 8:02 AM     Addendum:  I met with the patient and her family, including her mother and 6 sisters, and explained the patient's current circumstances and long term prognosis at length.  We discussed treatment options including long term palliative care versus aggressive treatment including surgical intervention to include left VATS and possible thoracotomy for drainage of malignant effusion.  After considerable discussion the patient and her family seem reasonably comfortable with a focus on palliative care and no attempts at surgical intervention.  All questions answered.  Please call if we can be of further assistance.  Rexene Alberts, MD 08/29/2016 12:25 PM

## 2016-08-30 LAB — GLUCOSE, CAPILLARY
GLUCOSE-CAPILLARY: 117 mg/dL — AB (ref 65–99)
GLUCOSE-CAPILLARY: 124 mg/dL — AB (ref 65–99)
Glucose-Capillary: 113 mg/dL — ABNORMAL HIGH (ref 65–99)
Glucose-Capillary: 118 mg/dL — ABNORMAL HIGH (ref 65–99)

## 2016-08-30 MED ORDER — OXYCODONE HCL 20 MG/ML PO CONC
5.0000 mg | ORAL | Status: DC | PRN
  Administered 2016-08-30: 5 mg via ORAL
  Filled 2016-08-30: qty 1

## 2016-08-30 NOTE — Progress Notes (Signed)
Daily Progress Note   Patient Name: Laura Neal       Date: 08/30/2016 DOB: 1950/04/27  Age: 66 y.o. MRN#: [id number] Attending Physician: Jonetta Osgood, MD Primary Care Physician: Bernerd Limbo, MD Admit Date: 08/28/2016  Reason for Consultation/Follow-up: Establishing goals of care, Hospice Evaluation, Non pain symptom management, Pain control and Psychosocial/spiritual support  Subjective: Patient still has mild shortness of breath at rest but states overall she's been fairly comfortable overnight. She feels like the Pleurx catheter is grabbing her.  Length of Stay: 2  Current Medications: Scheduled Meds:  . aspirin EC  81 mg Oral Daily  . feeding supplement  1 Container Oral TID BM  . insulin aspart  0-5 Units Subcutaneous QHS  . insulin aspart  0-9 Units Subcutaneous TID WC  . insulin glargine  15 Units Subcutaneous Daily  . mirtazapine  15 mg Oral QHS  . pantoprazole  40 mg Oral Daily  . rosuvastatin  10 mg Oral QHS  . sodium chloride flush  3 mL Intravenous Q12H  . sodium chloride flush  3 mL Intravenous Q12H    Continuous Infusions: . sodium chloride      PRN Meds: sodium chloride, acetaminophen **OR** acetaminophen, ondansetron **OR** ondansetron (ZOFRAN) IV, oxyCODONE, polyethylene glycol, sodium chloride flush, traMADol  Physical Exam  Constitutional: She is oriented to person, place, and time. She appears well-developed and well-nourished.  Neck: Normal range of motion.  Cardiovascular:  Tachycardic  Pulmonary/Chest:  Mild increased work of breathing at rest  Abdominal: Soft.  Neurological: She is alert and oriented to person, place, and time.  Skin: Skin is warm and dry.  Psychiatric: She has a normal mood and affect. Her behavior is normal. Thought  content normal.  Nursing note and vitals reviewed.           Vital Signs: BP 113/67   Pulse (!) 106   Temp 97.8 F (36.6 C) (Oral)   Resp 18   Ht 5\' 8"  (1.727 m)   Wt 87.2 kg (192 lb 4.8 oz)   SpO2 99%   BMI 29.24 kg/m  SpO2: SpO2: 99 % O2 Device: O2 Device: Nasal Cannula O2 Flow Rate: O2 Flow Rate (L/min): 2 L/min  Intake/output summary:  Intake/Output Summary (Last 24 hours) at 08/30/16 1020 Last data filed at 08/30/16 6945  Gross per 24 hour  Intake              480 ml  Output              300 ml  Net              180 ml   LBM: Last BM Date: 08/29/16 Baseline Weight: Weight: 87.6 kg (193 lb 3.2 oz) Most recent weight: Weight: 87.2 kg (192 lb 4.8 oz)       Palliative Assessment/Data:    Flowsheet Rows     Most Recent Value  Intake Tab  Referral Department  Hospitalist  Unit at Time of Referral  Med/Surg Unit  Palliative Care Primary Diagnosis  Pulmonary  Date Notified  08/28/16  Palliative Care Type  New Palliative care  Reason for referral  Pain, Non-pain Symptom, Clarify Goals of Care  Date of Admission  08/28/16  Date first seen by Palliative Care  08/29/16  # of days Palliative referral response time  1 Day(s)  # of days IP prior to Palliative referral  0  Clinical Assessment  Palliative Performance Scale Score  40%  Pain Max last 24 hours  Not able to report  Pain Min Last 24 hours  Not able to report  Dyspnea Max Last 24 Hours  Not able to report  Dyspnea Min Last 24 hours  Not able to report  Nausea Max Last 24 Hours  Not able to report  Nausea Min Last 24 Hours  Not able to report  Anxiety Max Last 24 Hours  Not able to report  Anxiety Min Last 24 Hours  Not able to report  Other Max Last 24 Hours  Not able to report  Psychosocial & Spiritual Assessment  Palliative Care Outcomes  Patient/Family meeting held?  Yes      Patient Active Problem List   Diagnosis Date Noted  . Protein-calorie malnutrition, severe 08/29/2016  . Palliative care by  specialist   . Malignant pleural effusion 08/28/2016  . Pleural effusion   . Atelectasis   . Hypoxemia   . Pressure injury of skin 08/17/2016  . Moderate protein-calorie malnutrition ([place]) 08/14/2016  . AKI (acute kidney injury) ([place]) 08/14/2016  . Pleural effusion on left 08/14/2016  . Acute kidney injury ([place]) 08/14/2016  . S/P hip replacement, right 06/16/2016  . Hx of adenomatous colonic polyps 06/16/2016  . Chest pain   . Dyspnea   . Renal cell carcinoma of right kidney ([place])   . Malignant neoplasm metastatic to left lung ([place])   . Pulmonary embolus ([place]) 05/26/2016  . OA (osteoarthritis) of hip 04/22/2016  . Type 2 diabetes mellitus with stage 3 chronic kidney disease, without long-term current use of insulin ([place]) 12/09/2014    Palliative Care Assessment & Plan   Patient Profile:  66 y.o. female  with past medical history of Arthritis, GERD, peptic ulcer disease, hypertension, hyperlipidemia, diabetes type 2, hip replacement, renal cell carcinoma with right nephrectomy in 2000 admitted on 08/28/2016 with worsening shortness of breath. Patient has had a recurrence of her renal cell carcinoma in March 2017 when she presented with a large mass on her left kidney. 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 Patient underwent left total hip replacement in January 2018. One month later she developed a deep vein thrombosis and pulmonary emboli and was anticoagulated on Eliquis. She presented to [facility] long hospital on 08/14/2016 for nausea or vomiting abdominal pain and  shortness of breath. She was found to be anemic, dehydrated, and in acute renal failure. She also was found to have large bilateral pleural effusions, left greater than right. She underwent thoracentesis on 08/15/2016 yielding 2 L of bloody fluid. All blood thinners and chemotherapy were stopped. Cardiothoracic surgical consultation was requested and patient had a guided Pleurx catheter  placed on the left. The catheter was placed on 08/25/2016 and at that time 500 mL's of bloody fluid were removed. Since that time the catheter has not been draining, and the left pleural effusion essentially remained unchanged. She was discharged home on 08/27/2016 but returned to the emergency department on 08/28/2016 secondary to persistent shortness of breath. Cytology from thoracentesis confirmed that these were malignant cells with primary being renal cell carcinoma Patient now is moving forward with a comfort, symptom based approach. We talked about going home with hospice support and she is in agreement with this    Recommendations/Plan:  She has elected DNR/DNI. Portable form filled out and is now on her chart for when she is transported home  Patient would like to get the Pleurx catheter out and less there is an overriding reason not to; she is having pain at the site  We'll change oxycodone to oxycodone Intensol formula so will be easier for patient to swallow both for management of pain and dyspnea. Discussed role of opioids in managing dyspnea with patient and she was in agreement  Patient to go home with hospice support when she is ready for DC. Order placed to care management to facilitate disposition with hospice. Patient will need equipment such as Hospital bed and oxygen in the home prior to discharge. I indicated this on care management consult  Goals of Care and Additional Recommendations:  Limitations on Scope of Treatment: Avoid Hospitalization, Minimize Medications, No Artificial Feeding, No Chemotherapy, No Hemodialysis, No Radiation, No Surgical Procedures and No Tracheostomy  Code Status:    Code Status Orders        Start     Ordered   08/30/16 1015  Do not attempt resuscitation (DNR)  Continuous    Question Answer Comment  In the event of cardiac or respiratory ARREST Do not call a "code blue"   In the event of cardiac or respiratory ARREST Do not perform  Intubation, CPR, defibrillation or ACLS   In the event of cardiac or respiratory ARREST Use medication by any route, position, wound care, and other measures to relive pain and suffering. May use oxygen, suction and manual treatment of airway obstruction as needed for comfort.      08/30/16 1014    Code Status History    Date Active Date Inactive Code Status Order ID Comments User Context   08/28/2016 10:29 PM 08/30/2016 10:14 AM Full Code [id number]  Barton Dubois, MD Inpatient   08/14/2016 10:49 PM 08/27/2016  8:33 PM Full Code [id number]  Lily Kocher, MD ED   05/26/2016  6:04 PM 05/27/2016 11:02 PM DNR [id number]  Virginia Crews, MD Inpatient   04/22/2016  6:12 PM 04/27/2016  6:30 PM Full Code [id number]  Gaynelle Arabian, MD Inpatient    Advance Directive Documentation     Most Recent Value  Type of Advance Directive  Healthcare Power of [place], Living will  Pre-existing out of facility DNR order (yellow form or pink MOST form)  -  "MOST" Form in Place?  -       Prognosis:  < 4 weeks in the setting of malignant renal cell  carcinoma, now with bilateral malignant pleural effusions, shortness of breath at rest. Patient is at high risk for acute respiratory failure  Discharge Planning:  Home with Hospice  Care plan was discussed with Dr. Sloan Leiter  Thank you for allowing the Palliative Medicine Team to assist in the care of this patient.   Time In: 0900 Time Out: 0940 Total Time 40 min Prolonged Time Billed  no       Greater than 50%  of this time was spent counseling and coordinating care related to the above assessment and plan.  Dory Horn, NP  Please contact Palliative Medicine Team phone at [phone] for questions and concerns.

## 2016-08-30 NOTE — Progress Notes (Signed)
PROGRESS NOTE        PATIENT DETAILS Name: Laura Neal Age: 66 y.o. Sex: female Date of Birth: 1950-10-11 Admit Date: 08/28/2016 Admitting Physician No admitting provider for patient encounter. [id number], [name], MD  Brief Narrative: Patient is a 66 y.o. 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 She presented to the ED on 6/1 recurrent shortness of breath-was found to have a large recurrent (likely malignant) pleural effusion. She was subsequently transferred to [facility] for cardiothoracic surgery evaluation. See below for further details  Subjective: No distress at rest-but does acknowledge yspnea with minimal exertion.  Assessment/Plan: Malignant/recurrent pleural effusion: Unfortunately has developed numerous trabeculations/loculations-although has a Pleurx catheter in place-hardly any output from it. Seen by cardiothoracic surgery-not felt to be a candidate for aggressive measures. Subsequently seen by palliative care, after extensive discussions-she is now a DO NOT RESUSCITATE-with plans to discharge home with hospice care. Per palliative care, patient wishes that the pleural catheter be removed-will touch base with IR on 6/4. I will also try and touch with the patient's oncologist on 6/4.   Metastatic renal cell carcinoma: Complicated by recurrent malignant pleural effusion-as noted above-her overall prognosis is poor-although she is somewhat comfortable at rest, she gets shortness of breath with minimal exertion. See above regarding plans and CODE STATUS. Reviewed most recent oncology note on 5/24-patient no longer is on votrient.  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  Thrombocytosis/leukocytosis: Likely reactive-due to underlying malignancy, doubt infection. Follow CBC periodically  Anemia: Likely secondary to underlying malignancy-follow periodically  DM-2: CBGs stable, continue 15 units of Lantus daily and SSI. Plans are to follow CBGs and adjust regimen accordingly.  Dyslipidemia: Continue statin  Chronic kidney disease stage III: Creatinine close to usual baseline-follow.  Moderate protein calorie malnutrition: Continue supplements  Stage II sacral decubitus ulcer: Present on admission, wound care RN consulted.  DVT Prophylaxis: SCD's  Code Status: DNR  Family Communication: None at bedside  Disposition Plan: Remain inpatient-likely home with hospice on discharge  Antimicrobial agents: Anti-infectives    None      Procedures: None  CONSULTS:  Cardiothoracic surgery and palliative care  Time spent: 25 minutes-Greater than 50% of this time was spent in counseling, explanation of diagnosis, planning of further management, and coordination of care.  MEDICATIONS: Scheduled Meds: . aspirin EC  81 mg Oral Daily  . feeding supplement  1 Container Oral TID BM  . insulin aspart  0-5 Units Subcutaneous QHS  . insulin aspart  0-9 Units Subcutaneous TID WC  . insulin glargine  15 Units Subcutaneous Daily  . mirtazapine  15 mg Oral QHS  . pantoprazole  40 mg Oral Daily  . rosuvastatin  10 mg Oral QHS  . sodium chloride flush  3 mL Intravenous Q12H  . sodium chloride flush  3 mL Intravenous Q12H   Continuous Infusions: . sodium chloride     PRN Meds:.sodium chloride, acetaminophen **OR** acetaminophen, ondansetron **OR** ondansetron (ZOFRAN) IV, oxyCODONE, polyethylene glycol, sodium chloride flush, traMADol   PHYSICAL EXAM: Vital signs: Vitals:   08/29/16 1442 08/29/16 1952 08/30/16 0447 08/30/16  0800  BP: (!) 115/95 111/64 (!) 107/53 113/67  Pulse: (!) 103 (!) 102 (!) 103 (!) 106   Resp: 16   18  Temp: 98.4 F (36.9 C) 98.5 F (36.9 C) 97.6 F (36.4 C) 97.8 F (36.6 C)  TempSrc: Oral Oral Oral Oral  SpO2: 98% 99% 100% 99%  Weight:   87.2 kg (192 lb 4.8 oz)   Height:       Filed Weights   08/28/16 2215 08/29/16 0506 08/30/16 0447  Weight: 87.6 kg (193 lb 3.2 oz) 88.1 kg (194 lb 3.2 oz) 87.2 kg (192 lb 4.8 oz)   Body mass index is 29.24 kg/m.   General appearance :Awake, alert Eyes:, pupils equally reactive to light and accomodation HEENT: Atraumatic and Normocephalic Neck: supple, no JVD. Resp: decreased air entry on the left side ome scattered rhonchi CVS: S1 S2 regular, no murmurs.  GI: Bowel sounds present, Non tender and not distended with no gaurding, rigidity or rebound. Extremities: B/L Lower Ext shows + edema, both legs are warm to touch Neurology:  speech clear,Non focal, sensation is grossly intact. Psychiatric: Normal judgment and insight.  Musculoskeletal:No digital cyanosis Skin:No Rash, warm and dry Wounds:N/A  I have personally reviewed following labs and imaging studies  LABORATORY DATA: CBC:  Recent Labs Lab 08/24/16 0640 08/25/16 0609 08/26/16 0701 08/28/16 1300 08/29/16 0612  WBC 10.8* 12.2* 11.2* 14.9* 14.8*  NEUTROABS 7.8* 8.9* 8.3* 12.3*  --   HGB 8.2* 8.3* 7.8* 8.1* 8.2*  HCT 26.3* 27.3* 25.6* 26.1* 27.5*  MCV 94.9 95.5 94.5 93.9 94.8  PLT 463* 481* 420* 534* 550*    Basic Metabolic Panel:  Recent Labs Lab 08/25/16 0609 08/26/16 0701 08/27/16 0800 08/28/16 1300 08/29/16 0612  NA 139 142 140 141 136  K 4.3 3.9 3.4* 3.7 3.9  CL 113* 115* 112* 112* 108  CO2 17* 19* 20* 21* 16*  GLUCOSE 94 104* 116* 142* 98  BUN 15 15 14 15 12   CREATININE 1.39* 1.56* 1.47* 1.43* 1.28*  CALCIUM 8.1* 8.1* 8.1* 8.2* 8.1*  MG  --  1.6*  --   --  1.7  PHOS  --   --   --   --  3.5    GFR: Estimated Creatinine Clearance: 49.3 mL/min (A) (by C-G formula based on SCr of 1.28 mg/dL (H)).  Liver Function Tests:  Recent  Labs Lab 08/29/16 0612  AST 42*  ALT 19  ALKPHOS 122  BILITOT 0.7  PROT 5.7*  ALBUMIN 1.6*   No results for input(s): LIPASE, AMYLASE in the last 168 hours. No results for input(s): AMMONIA in the last 168 hours.  Coagulation Profile:  Recent Labs Lab 08/25/16 0609  INR 1.49    Cardiac Enzymes: No results for input(s): CKTOTAL, CKMB, CKMBINDEX, TROPONINI in the last 168 hours.  BNP (last 3 results) No results for input(s): PROBNP in the last 8760 hours.  HbA1C: No results for input(s): HGBA1C in the last 72 hours.  CBG:  Recent Labs Lab 08/29/16 0729 08/29/16 1140 08/29/16 1616 08/29/16 2055 08/30/16 0826  GLUCAP 99 104* 90 88 117*    Lipid Profile: No results for input(s): CHOL, HDL, LDLCALC, TRIG, CHOLHDL, LDLDIRECT in the last 72 hours.  Thyroid Function Tests: No results for input(s): TSH, T4TOTAL, FREET4, T3FREE, THYROIDAB in the last 72 hours.  Anemia Panel: No results for input(s): VITAMINB12, FOLATE, FERRITIN, TIBC, IRON, RETICCTPCT in the last 72 hours.  Urine analysis:    Component Value Date/Time  COLORURINE YELLOW 08/14/2016 2255   APPEARANCEUR CLEAR 08/14/2016 2255   LABSPEC 1.011 08/14/2016 2255   PHURINE 7.0 08/14/2016 2255   GLUCOSEU NEGATIVE 08/14/2016 2255   HGBUR NEGATIVE 08/14/2016 2255   BILIRUBINUR NEGATIVE 08/14/2016 2255   BILIRUBINUR negative 07/27/2016 1311   BILIRUBINUR neg 12/09/2014 1458   KETONESUR NEGATIVE 08/14/2016 2255   PROTEINUR NEGATIVE 08/14/2016 2255   UROBILINOGEN 0.2 07/27/2016 1311   NITRITE NEGATIVE 08/14/2016 2255   LEUKOCYTESUR NEGATIVE 08/14/2016 2255    Sepsis Labs: Lactic Acid, Venous    Component Value Date/Time   LATICACIDVEN 1.2 08/15/2016 0201    MICROBIOLOGY: No results found for this or any previous visit (from the past 240 hour(s)).  RADIOLOGY STUDIES/RESULTS: Dg Chest 1 View  Result Date: 08/15/2016 CLINICAL DATA:  Status post left thoracentesis. EXAM: CHEST 1 VIEW COMPARISON:   Aug 14, 2016 FINDINGS: The left-sided pleural effusion remains but is smaller. No pneumothorax. No other changes. IMPRESSION: Decreased size of left-sided pleural effusion after thoracentesis. No pneumothorax. Electronically Signed   By: Dorise Bullion III M.D   On: 08/15/2016 14:09   Dg Chest 2 View  Result Date: 08/28/2016 CLINICAL DATA:  Increasing shortness of breath and weakness since being discharged from the hospital yesterday. EXAM: CHEST  2 VIEW COMPARISON:  Single-view of the chest 08/21/2016. FINDINGS: Left worse than right pleural effusions and airspace disease persist. The patient's left pleural effusion has increased somewhat since the prior examination. Pulmonary nodules on the right are identified as seen on prior exams. Known left pulmonary nodules are obscured. No pneumothorax. IMPRESSION: Persistent left greater than right pleural effusions and airspace disease. The patient's left effusion shows some increase since the most recent exam. Bilateral pulmonary nodules as seen on the prior study. Electronically Signed   By: Inge Rise M.D.   On: 08/28/2016 12:11   Dg Chest 2 View  Result Date: 08/14/2016 CLINICAL DATA:  PT c/o SOB upon exertion recently, as well as decrease in appetite. Pt was taken off of chemo pills today due to health reasons. Pt has cancer of right kidney. H/o PNA, Pulmonary Embolism, Type II Diabetes. Former smoker. EXAM: CHEST  2 VIEW COMPARISON:  05/26/2016 FINDINGS: Moderate to large pleural effusion obscures hemidiaphragm and left heart border, significantly increased from the prior exam. No right pleural effusion. No evidence of pneumonia or pulmonary edema. Cardiac silhouette is partly obscured but grossly normal in size. No mediastinal or hilar masses. No convincing adenopathy. No pneumothorax. Skeletal structures are intact. IMPRESSION: 1. Moderate to large left pleural effusion. This has significantly increased in size from the prior study. 2. No right  pleural effusion. No evidence of pneumonia or pulmonary edema. Electronically Signed   By: Lajean Manes M.D.   On: 08/14/2016 18:05   Ct Chest Wo Contrast  Result Date: 08/28/2016 CLINICAL DATA:  Pleural effusion, PleurX tube not draining, worsened shortness of breath with exertion, history of RIGHT renal cancer, hypertension, former emboli EXAM: CT CHEST WITHOUT CONTRAST TECHNIQUE: Multidetector CT imaging of the chest was performed following the standard protocol without IV contrast. Sagittal and coronal MPR images reconstructed from axial data set. COMPARISON:  08/16/2016 FINDINGS: Cardiovascular: Atherosclerotic calcifications aorta and coronary arteries. Minimal pericardial effusion. Mediastinum/Nodes: Mediastinal adenopathy slightly increased since previous exam. AP window node 21 mm short axis image 51 previously 18 mm. RIGHT paratracheal node 14 mm short axis image 42 previously 10 mm. Subcarinal node 15 mm short axis image 65 previously 11 mm. Esophagus unremarkable. Base of cervical region  normal appearance. Lungs/Pleura: PleurX catheter coiled in the inferolateral LEFT hemithorax. Increased LEFT pleural effusion since previous exam, partially loculated. Multiple septations are identified as well as larger more rounded areas of opacification within the pleural effusion likely representing malignant pleural deposits. Increased atelectasis of LEFT upper and LEFT lower lobes. Pulmonary metastases again visualized, largest RIGHT mid lung 11 mm short axis image 44 and 14 mm LEFT apex image 24. No pneumothorax. Upper Abdomen: Complex cystic and solid mass consistent with known neoplasm at the upper LEFT kidney, 8.9 x 4.6 cm image 155. Enlarged LEFT adrenal gland 2.4 x 1.3 cm image 134 little changed. Pancreas appears mildly enlarged and slightly ill-defined at the pancreatic body unchanged since the previous study suggesting pancreatitis. No additional upper abdominal abnormalities. Surgically absent RIGHT  kidney. Thickening of RIGHT adrenal gland without discrete mass. Musculoskeletal: No osseous metastases. IMPRESSION: Large malignant LEFT pleural effusion with septations and tumor nodularity, increased in size since previous exam. Moderate RIGHT pleural effusion, increased since previous exam. Pulmonary metastases again identified with increased compressive atelectasis of both lungs. Increased sizes of mediastinal adenopathy. Complex cystic and solid LEFT renal neoplasm and probable LEFT adrenal metastasis. Suspected pancreatitis changes again seen. Aortic atherosclerosis and coronary arterial calcification with minimal pericardial effusion. Electronically Signed   By: Lavonia Dana M.D.   On: 08/28/2016 15:13   Ct Chest Wo Contrast  Result Date: 08/16/2016 CLINICAL DATA:  Drop in hemoglobin post thoracentesis. EXAM: CT CHEST WITHOUT CONTRAST TECHNIQUE: Multidetector CT imaging of the chest was performed following the standard protocol without IV contrast. COMPARISON:  06/17/2016 FINDINGS: Cardiovascular: Extensive aortic and coronary atheromatous calcifications. No aneurysm. Small pericardial effusion. Mediastinum/Nodes: 18 mm enlarging left suprahilar node, previously 14 mm. 1 cm pretracheal node, previously 7 mm. No axillary adenopathy. Lungs/Pleura: No pneumothorax. Small layering right pleural effusion. Moderately large hyperdense residual/recurrent left pleural effusion probably hemorrhagic. compressive atelectasis throughout the basilar segments of left lower lobe and inferior lingula. Bilateral pulmonary nodules with slight progression. Index right lower lobe nodule 19 mm image 99/5, previously 17 mm. Posterior 13 mm left upper lobe nodule image 50/5, previously 11 mm by my measurement. Additional smaller nodules bilaterally, several cavitary. Upper Abdomen: Previous right nephrectomy. Mixed attenuation left renal mass, incompletely visualized. Progressive inflammatory/edematous changes around pancreatic  head and body, incompletely seen. Stable left adrenal enlargement. Musculoskeletal: Negative for fracture. IMPRESSION: 1. Moderately large  residual/recurrent hemorrhagic left effusion. 2. Progression of bilateral pulmonary nodules and mediastinal adenopathy. 3. Small pericardial and right pleural effusions. 4. New peripancreatic inflammatory/edematous changes suggesting pancreatitis. 5. Aortic and coronary atherosclerosis. 6. Left renal and left adrenal masses again noted. Electronically Signed   By: Lucrezia Europe M.D.   On: 08/16/2016 10:46   Ir Guided Niel Hummer W Catheter Placement  Result Date: 08/25/2016 INDICATION: Recurrent malignant left pleural effusion EXAM: ULTRASOUND AND FLUOROSCOPIC LEFT TUNNELED PLEURAL DRAIN (PLEURX CATHETER) MEDICATIONS: Ancef 2 g, administered within 1 hour of the procedure ANESTHESIA/SEDATION: Fentanyl 100 mcg IV; Versed 2.0 mg IV Moderate Sedation Time:  20 minutes The patient was continuously monitored during the procedure by the interventional radiology nurse under my direct supervision. COMPLICATIONS: None immediate. PROCEDURE: Informed written consent was obtained from the patient after a thorough discussion of the procedural risks, benefits and alternatives. All questions were addressed. Maximal Sterile Barrier Technique was utilized including caps, mask, sterile gowns, sterile gloves, sterile drape, hand hygiene and skin antiseptic. A timeout was performed prior to the initiation of the procedure. Previous imaging reviewed. Right lower chest  marked in the mid axillary line through a lower intercostal space. Under sterile conditions and local anesthesia, ultrasound percutaneous access performed of the left pleural space. Needle position confirmed with ultrasound. Images obtained for documentation. There was return of bloody pleural fluid. Amplatz guidewire inserted. In the adjacent soft tissues, a subcutaneous tunnel was created. The PleurX catheter was tunneled subcutaneously  to the puncture site and advanced into the left chest through the valved pleural sheath. Position confirmed with fluoroscopy. Thoracentesis performed yielding 5 minutes 50 cc bloody pleural fluid. Catheter secured with a Prolene suture. Entry site closed with subcuticular Vicryl suture and derma bond. Sterile dressing applied. No immediate complication. Patient tolerated the procedure well. IMPRESSION: Successful ultrasound and fluoroscopic 15.5 French left tunneled pleural drain (PleurX catheter). 550 cc left thoracentesis performed after insertion. Electronically Signed   By: Jerilynn Mages.  Shick M.D.   On: 08/25/2016 10:44   Dg Chest Port 1 View  Result Date: 08/21/2016 CLINICAL DATA:  Post thoracentesis EXAM: PORTABLE CHEST 1 VIEW COMPARISON:  08/21/2016 FINDINGS: Decreased left pleural effusion with moderate residual. Improved aeration of left upper lobe. No definitive pneumothorax. Small right pleural effusion. Persistent bibasilar left greater than right atelectasis or pneumonia. Mild cardiomegaly. Aortic atherosclerosis. IMPRESSION: 1. Decreased pleural effusion on the left, no pneumothorax is seen. Moderate residual left pleural effusion. 2. Otherwise no change in small right pleural effusion and dense left greater than right bilateral lower lung consolidations which may reflect atelectasis or pneumonia Electronically Signed   By: Donavan Foil M.D.   On: 08/21/2016 14:43   Dg Chest Port 1 View  Result Date: 08/21/2016 CLINICAL DATA:  Dyspnea EXAM: PORTABLE CHEST 1 VIEW COMPARISON:  08/19/2016 chest radiograph. FINDINGS: Stable cardiomediastinal silhouette with mild cardiomegaly and aortic atherosclerosis. No pneumothorax. Small right pleural effusion appears slightly increased. Large left pleural effusion appears stable. Stable near complete opacification of the left lung with minimal preserved aeration in the left upper lobe. Worsening patchy right lung base opacity. IMPRESSION: 1. Stable large left pleural  effusion. Increased small right pleural effusion . 2. Stable near complete left lung opacification with minimal preserved aeration in the left upper lobe. Worsening patchy right lung base opacity. Findings could represent atelectasis, with pneumonia or aspiration not excluded. 3. Stable cardiomegaly. 4. Aortic atherosclerosis. Electronically Signed   By: Ilona Sorrel M.D.   On: 08/21/2016 11:53   Dg Chest Port 1 View  Result Date: 08/19/2016 CLINICAL DATA:  Post left thoracentesis EXAM: PORTABLE CHEST 1 VIEW COMPARISON:  08/19/2016 FINDINGS: Continued large left pleural effusion, slightly decreased since prior study with slight increase in aeration of the left lung. No pneumothorax. Small right pleural effusion with right lower lobe atelectasis or infiltrate is stable. IMPRESSION: Slight decreased size of the large left pleural effusion following thoracentesis. No pneumothorax. Otherwise no change. Electronically Signed   By: Rolm Baptise M.D.   On: 08/19/2016 11:46   Dg Chest Port 1 View  Result Date: 08/19/2016 CLINICAL DATA:  Pleural effusion. EXAM: PORTABLE CHEST 1 VIEW COMPARISON:  08/18/2016.  CT 08/16/2016. FINDINGS: Heart size stable. Persistent large left pleural effusion, unchanged from prior exam. Small right pleural effusion again noted unchanged. Basilar atelectasis. Pulmonary nodules present best identified by prior CT. No pneumothorax. IMPRESSION: 1. Stable chest with large left pleural effusion and small right pleural effusion. 2. Basilar atelectasis. Pulmonary nodules present best identified by prior CT of 05/ 20/2018 . Electronically Signed   By: Marcello Moores  Register   On: 08/19/2016 07:16   Dg  Chest Port 1 View  Result Date: 08/18/2016 CLINICAL DATA:  Pleural effusion. EXAM: PORTABLE CHEST 1 VIEW COMPARISON:  08/17/2016.  CT 08/16/2016. FINDINGS: Heart size stable Persistent large left pleural effusion, unchanged from prior exam. Small right pleural effusion again noted without interim  change . Bibasilar atelectasis. Pulmonary nodules best identified by prior CT of 08/16/2016 . No pneumothorax. IMPRESSION: 1. Persistent large left pleural effusion. Small right pleural effusion. 2. Bibasilar atelectasis. Pulmonary nodules best identified by prior CT of 08/16/2016. Electronically Signed   By: Marcello Moores  Register   On: 08/18/2016 06:56   Dg Chest Port 1 View  Result Date: 08/17/2016 CLINICAL DATA:  66 year old female left-sided pleural effusion. Subsequent encounter. EXAM: PORTABLE CHEST 1 VIEW COMPARISON:  08/16/2016 CT and chest x-ray. FINDINGS: Large left-sided and small right-sided pleural effusion. Almost complete opacification left hemithorax. Left-sided pleural effusion has increased in size since prior exam. CT detected pulmonary nodules and adenopathy not as well delineated on present plain film exam. IMPRESSION: Increase in size of large left-sided pleural effusion. Almost complete opacification left hemithorax. Small right-sided pleural effusion. Electronically Signed   By: Genia Del M.D.   On: 08/17/2016 07:21   Dg Chest Port 1 View  Result Date: 08/16/2016 CLINICAL DATA:  Pleural effusion EXAM: PORTABLE CHEST 1 VIEW COMPARISON:  08/15/2016 FINDINGS: Moderate left pleural effusion again noted with left lower lobe atelectasis. Increasing left upper lobe airspace disease. Right basilar atelectasis or infiltrate. Mild cardiomegaly. IMPRESSION: Continued moderate left pleural effusion with left lower lobe atelectasis or infiltrate and right basilar opacity. Increasing left upper lobe airspace opacity. Electronically Signed   By: Rolm Baptise M.D.   On: 08/16/2016 08:43   US Thoracentesis Asp Pleural Space W/img Guide  Result Date: 08/21/2016 INDICATION: Metastatic renal cell carcinoma, dyspnea, recurrent left pleural effusion; request made for therapeutic left thoracentesis. EXAM: ULTRASOUND GUIDED THERAPEUTIC LEFT THORACENTESIS MEDICATIONS: None. COMPLICATIONS: None immediate.  PROCEDURE: An ultrasound guided thoracentesis was thoroughly discussed with the patient and questions answered. The benefits, risks, alternatives and complications were also discussed. The patient understands and wishes to proceed with the procedure. Written consent was obtained. Ultrasound was performed to localize and mark an adequate pocket of fluid in the left chest. The area was then prepped and draped in the normal sterile fashion. 1% Lidocaine was used for local anesthesia. Under ultrasound guidance a Safe-T-Centesis catheter was introduced. Thoracentesis was performed. The catheter was removed and a dressing applied. FINDINGS: A total of approximately 1.5 liters of bloody fluid was removed. Only the above amount of fluid was removed at this time secondary to persistent patient coughing and chest discomfort. IMPRESSION: Successful ultrasound guided therapeutic left thoracentesis yielding 1.5 liters of pleural fluid. Read by: Rowe Robert, PA-C Electronically Signed   By: Sandi Mariscal M.D.   On: 08/21/2016 14:43   US Thoracentesis Asp Pleural Space W/img Guide  Result Date: 08/15/2016 INDICATION: SOB upon exertion. Cancer of the right kidney. Left pleural effusion. EXAM: ULTRASOUND GUIDED LEFT THORACENTESIS MEDICATIONS: 1% Lidocaine = 10 mL. COMPLICATIONS: None immediate. PROCEDURE: An ultrasound guided thoracentesis was thoroughly discussed with the patient and questions answered. The benefits, risks, alternatives and complications were also discussed. The patient understands and wishes to proceed with the procedure. Written consent was obtained. Ultrasound was performed to localize and mark an adequate pocket of fluid in the left chest. The area was then prepped and draped in the normal sterile fashion. 1% Lidocaine was used for local anesthesia. Under ultrasound guidance a catheter was introduced.  Thoracentesis was performed. The catheter was removed and a dressing applied. FINDINGS: A total of  approximately 2 liters of bloody fluid was removed. Samples were sent to the laboratory as requested by the clinical team. IMPRESSION: Successful ultrasound guided left thoracentesis yielding 2 liters of pleural fluid. Post procedure radiograph shows no pneumothorax. Read by:  Gareth Eagle, PA-C Electronically Signed   By: Lucrezia Europe M.D.   On: 08/15/2016 13:52     LOS: 2 days   Oren Binet, MD  [facility] Pager:[phone]  If 7PM-7AM, please contact night-coverage [email or web address] Password [email or web address] 08/30/2016, 10:17 AM

## 2016-08-31 ENCOUNTER — Inpatient Hospital Stay (HOSPITAL_COMMUNITY): Payer: Medicare Other

## 2016-08-31 ENCOUNTER — Encounter (HOSPITAL_COMMUNITY): Payer: Self-pay | Admitting: Interventional Radiology

## 2016-08-31 DIAGNOSIS — C7802 Secondary malignant neoplasm of left lung: Secondary | ICD-10-CM

## 2016-08-31 DIAGNOSIS — D638 Anemia in other chronic diseases classified elsewhere: Secondary | ICD-10-CM

## 2016-08-31 HISTORY — PX: IR REMOVAL OF PLURAL CATH W/CUFF: IMG5346

## 2016-08-31 LAB — BASIC METABOLIC PANEL
Anion gap: 6 (ref 5–15)
BUN: 12 mg/dL (ref 6–20)
CHLORIDE: 112 mmol/L — AB (ref 101–111)
CO2: 22 mmol/L (ref 22–32)
CREATININE: 1.18 mg/dL — AB (ref 0.44–1.00)
Calcium: 8.2 mg/dL — ABNORMAL LOW (ref 8.9–10.3)
GFR calc Af Amer: 54 mL/min — ABNORMAL LOW (ref >60)
GFR calc non Af Amer: 47 mL/min — ABNORMAL LOW (ref >60)
Glucose, Bld: 106 mg/dL — ABNORMAL HIGH (ref 65–99)
Potassium: 3.6 mmol/L (ref 3.5–5.1)
SODIUM: 140 mmol/L (ref 135–145)

## 2016-08-31 LAB — CBC WITH DIFFERENTIAL/PLATELET
BASOS ABS: 0 10*3/uL (ref 0.0–0.1)
BASOS PCT: 0 %
EOS PCT: 1 %
Eosinophils Absolute: 0.2 10*3/uL (ref 0.0–0.7)
HCT: 26.8 % — ABNORMAL LOW (ref 36.0–46.0)
Hemoglobin: 7.7 g/dL — ABNORMAL LOW (ref 12.0–15.0)
Lymphocytes Relative: 14 %
Lymphs Abs: 1.9 10*3/uL (ref 0.7–4.0)
MCH: 27.7 pg (ref 26.0–34.0)
MCHC: 28.7 g/dL — ABNORMAL LOW (ref 30.0–36.0)
MCV: 96.4 fL (ref 78.0–100.0)
MONO ABS: 1 10*3/uL (ref 0.1–1.0)
Monocytes Relative: 8 %
Neutro Abs: 10.5 10*3/uL — ABNORMAL HIGH (ref 1.7–7.7)
Neutrophils Relative %: 77 %
Platelets: 610 10*3/uL — ABNORMAL HIGH (ref 150–400)
RBC: 2.78 MIL/uL — ABNORMAL LOW (ref 3.87–5.11)
RDW: 19.3 % — AB (ref 11.5–15.5)
WBC: 13.5 10*3/uL — ABNORMAL HIGH (ref 4.0–10.5)

## 2016-08-31 LAB — PROTIME-INR
INR: 1.57
Prothrombin Time: 18.9 seconds — ABNORMAL HIGH (ref 11.4–15.2)

## 2016-08-31 LAB — GLUCOSE, CAPILLARY
GLUCOSE-CAPILLARY: 112 mg/dL — AB (ref 65–99)
GLUCOSE-CAPILLARY: 121 mg/dL — AB (ref 65–99)
Glucose-Capillary: 97 mg/dL (ref 65–99)

## 2016-08-31 MED ORDER — CHLORHEXIDINE GLUCONATE 4 % EX LIQD
CUTANEOUS | Status: AC
  Filled 2016-08-31: qty 15

## 2016-08-31 MED ORDER — LIDOCAINE HCL 1 % IJ SOLN
INTRAMUSCULAR | Status: AC
  Filled 2016-08-31: qty 20

## 2016-08-31 MED ORDER — METHOCARBAMOL 500 MG PO TABS: 500.0000 mg | ORAL_TABLET | Freq: Four times a day (QID) | ORAL | 0 refills | Status: AC | PRN

## 2016-08-31 MED ORDER — POLYETHYLENE GLYCOL 3350 17 G PO PACK: 17.0000 g | PACK | Freq: Every day | ORAL | 0 refills | Status: AC | PRN

## 2016-08-31 MED ORDER — OXYCODONE HCL 20 MG/ML PO CONC: 5.0000 mg | ORAL | 0 refills | Status: AC | PRN

## 2016-08-31 MED ORDER — LIDOCAINE HCL 1 % IJ SOLN
INTRAMUSCULAR | Status: DC | PRN
  Administered 2016-08-31: 5 mL

## 2016-08-31 MED ORDER — BOOST / RESOURCE BREEZE PO LIQD: 1.0000 | Freq: Three times a day (TID) | ORAL | 0 refills | Status: AC

## 2016-08-31 NOTE — Progress Notes (Signed)
PROGRESS NOTE        PATIENT DETAILS Name: Laura Neal Age: 66 y.o. Sex: female Date of Birth: 1951/03/11 Admit Date: 08/28/2016 Admitting Physician No admitting provider for patient encounter. WGN:FAOZHY, [name], MD  Brief Narrative: Patient is a 66 y.o. 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 She presented to the ED on 6/1 recurrent shortness of breath-was found to have a large recurrent (likely malignant) pleural effusion. She was subsequently transferred to [facility] for cardiothoracic surgery evaluation. See below for further details  Subjective: Lying comfortably in bed-and denies any chest pain at rest. No shortness of breath at rest  Assessment/Plan: Malignant/recurrent pleural effusion: Recurrent malignant pleural effusion-unfortunately has developed numerous loculations and trabeculations. Has a Pleurx catheter in place-that is not draining well. Seen by cardiothoracic surgery this admission-not felt to be a candidate for aggressive care. Subsequently, we had extensive discussions with the patient and family-given very poor overall prognoses-patient is now a DO NOT RESUSCITATE, with plans to discharge home with hospice care. She desires that the Pleurx catheter be removed-which is scheduled for later today. Case manager is aware to arrange for home health services. I spoke with patient's primary oncologist (Dr Alen Blew) over the phone this morning-he is fully agreeable with above noted plan.  Metastatic renal cell carcinoma: Complicated by recurrent malignant pleural effusion-see above. Spoke with patient's primary oncologist today-he is agreeable with plans to transition to hospice care.  Reviewed most recent oncology note on 5/24-patient  no longer is on votrient.  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  Thrombocytosis/leukocytosis: Likely reactive-due to underlying malignancy, doubt infection. Follow CBC periodically  Anemia: Slight decrease in hemoglobin-but no overt bleeding-likely secondary to chronic disease/malignancy. Follow periodically.  DM-2: CBGs stable, continue 15 units of Lantus daily and SSI. Plans are to follow CBGs and adjust regimen accordingly.  Dyslipidemia: Continue statin  Chronic kidney disease stage III: Creatinine close to usual baseline-follow.  Moderate protein calorie malnutrition: Continue supplements  Stage II sacral decubitus ulcer: Present on admission, wound care RN consulted.  DVT Prophylaxis: SCD's  Code Status: DNR  Family Communication: None at bedside  Disposition Plan: Remain inpatient-likely home with hospice on discharge-either later today or tomorrow morning  Antimicrobial agents: Anti-infectives    None      Procedures: None  CONSULTS:  Cardiothoracic surgery and palliative care  Time spent: 25 minutes-Greater than 50% of this time was spent in counseling, explanation of diagnosis, planning of further management, and coordination of care.  MEDICATIONS: Scheduled Meds: . aspirin EC  81 mg Oral Daily  . feeding supplement  1 Container Oral TID BM  . insulin aspart  0-5 Units Subcutaneous QHS  . insulin aspart  0-9 Units Subcutaneous TID WC  . insulin glargine  15 Units Subcutaneous Daily  . mirtazapine  15 mg Oral QHS  . pantoprazole  40 mg Oral Daily  . rosuvastatin  10 mg Oral QHS  . sodium chloride flush  3 mL Intravenous Q12H  . sodium chloride flush  3 mL Intravenous Q12H   Continuous Infusions: . sodium chloride     PRN  Meds:.sodium chloride, acetaminophen **OR** acetaminophen, ondansetron **OR** ondansetron (ZOFRAN) IV, oxyCODONE, polyethylene glycol,  sodium chloride flush, traMADol   PHYSICAL EXAM: Vital signs: Vitals:   08/30/16 2145 08/31/16 0245 08/31/16 0512 08/31/16 0608  BP:   (!) 98/53   Pulse: (!) 108 (!) 110 (!) 106 (!) 106  Resp:   15   Temp:   98.7 F (37.1 C)   TempSrc:      SpO2: 100% 98% 100% 98%  Weight:   87.2 kg (192 lb 3.2 oz)   Height:       Filed Weights   08/29/16 0506 08/30/16 0447 08/31/16 0512  Weight: 88.1 kg (194 lb 3.2 oz) 87.2 kg (192 lb 4.8 oz) 87.2 kg (192 lb 3.2 oz)   Body mass index is 29.22 kg/m.   General appearance :Awake, alert Eyes:,no scleral icterus. HEENT: Atraumatic and Normocephalic Neck: supple, no JVD. Resp: Decreased air entry on the left-few scattered rhonchi. CVS: S1 S2 regular, no murmurs.  GI: Bowel sounds present, Non tender and not distended with no gaurding, rigidity or rebound. Extremities: B/L Lower Ext shows + edema, both legs are warm to touch Neurology:  speech clear,Non focal, sensation is grossly intact. Psychiatric: Normal judgment and insight. Normal mood. Musculoskeletal:No digital cyanosis Skin:No Rash, warm and dry Wounds:N/A  I have personally reviewed following labs and imaging studies  LABORATORY DATA: CBC:  Recent Labs Lab 08/25/16 0609 08/26/16 0701 08/28/16 1300 08/29/16 0612 08/31/16 0423  WBC 12.2* 11.2* 14.9* 14.8* 13.5*  NEUTROABS 8.9* 8.3* 12.3*  --  10.5*  HGB 8.3* 7.8* 8.1* 8.2* 7.7*  HCT 27.3* 25.6* 26.1* 27.5* 26.8*  MCV 95.5 94.5 93.9 94.8 96.4  PLT 481* 420* 534* 550* 610*    Basic Metabolic Panel:  Recent Labs Lab 08/26/16 0701 08/27/16 0800 08/28/16 1300 08/29/16 0612 08/31/16 0423  NA 142 140 141 136 140  K 3.9 3.4* 3.7 3.9 3.6  CL 115* 112* 112* 108 112*  CO2 19* 20* 21* 16* 22  GLUCOSE 104* 116* 142* 98 106*  BUN 15 14 15 12 12   CREATININE 1.56* 1.47* 1.43* 1.28* 1.18*  CALCIUM 8.1* 8.1* 8.2* 8.1* 8.2*  MG 1.6*  --   --  1.7  --   PHOS  --   --   --  3.5  --     GFR: Estimated Creatinine Clearance:  53.5 mL/min (A) (by C-G formula based on SCr of 1.18 mg/dL (H)).  Liver Function Tests:  Recent Labs Lab 08/29/16 0612  AST 42*  ALT 19  ALKPHOS 122  BILITOT 0.7  PROT 5.7*  ALBUMIN 1.6*   No results for input(s): LIPASE, AMYLASE in the last 168 hours. No results for input(s): AMMONIA in the last 168 hours.  Coagulation Profile:  Recent Labs Lab 08/25/16 0609 08/31/16 0423  INR 1.49 1.57    Cardiac Enzymes: No results for input(s): CKTOTAL, CKMB, CKMBINDEX, TROPONINI in the last 168 hours.  BNP (last 3 results) No results for input(s): PROBNP in the last 8760 hours.  HbA1C: No results for input(s): HGBA1C in the last 72 hours.  CBG:  Recent Labs Lab 08/30/16 0826 08/30/16 1111 08/30/16 1704 08/30/16 2052 08/31/16 0714  GLUCAP 117* 124* 113* 118* 112*    Lipid Profile: No results for input(s): CHOL, HDL, LDLCALC, TRIG, CHOLHDL, LDLDIRECT in the last 72 hours.  Thyroid Function Tests: No results for input(s): TSH, T4TOTAL, FREET4, T3FREE, THYROIDAB in the last 72 hours.  Anemia Panel: No results for input(s): VITAMINB12, FOLATE,  FERRITIN, TIBC, IRON, RETICCTPCT in the last 72 hours.  Urine analysis:    Component Value Date/Time   COLORURINE YELLOW 08/14/2016 2255   APPEARANCEUR CLEAR 08/14/2016 2255   LABSPEC 1.011 08/14/2016 2255   PHURINE 7.0 08/14/2016 2255   GLUCOSEU NEGATIVE 08/14/2016 2255   HGBUR NEGATIVE 08/14/2016 2255   BILIRUBINUR NEGATIVE 08/14/2016 2255   BILIRUBINUR negative 07/27/2016 1311   BILIRUBINUR neg 12/09/2014 1458   KETONESUR NEGATIVE 08/14/2016 2255   PROTEINUR NEGATIVE 08/14/2016 2255   UROBILINOGEN 0.2 07/27/2016 1311   NITRITE NEGATIVE 08/14/2016 2255   LEUKOCYTESUR NEGATIVE 08/14/2016 2255    Sepsis Labs: Lactic Acid, Venous    Component Value Date/Time   LATICACIDVEN 1.2 08/15/2016 0201    MICROBIOLOGY: No results found for this or any previous visit (from the past 240 hour(s)).  RADIOLOGY  STUDIES/RESULTS: Dg Chest 1 View  Result Date: 08/15/2016 CLINICAL DATA:  Status post left thoracentesis. EXAM: CHEST 1 VIEW COMPARISON:  Aug 14, 2016 FINDINGS: The left-sided pleural effusion remains but is smaller. No pneumothorax. No other changes. IMPRESSION: Decreased size of left-sided pleural effusion after thoracentesis. No pneumothorax. Electronically Signed   By: Dorise Bullion III M.D   On: 08/15/2016 14:09   Dg Chest 2 View  Result Date: 08/28/2016 CLINICAL DATA:  Increasing shortness of breath and weakness since being discharged from the hospital yesterday. EXAM: CHEST  2 VIEW COMPARISON:  Single-view of the chest 08/21/2016. FINDINGS: Left worse than right pleural effusions and airspace disease persist. The patient's left pleural effusion has increased somewhat since the prior examination. Pulmonary nodules on the right are identified as seen on prior exams. Known left pulmonary nodules are obscured. No pneumothorax. IMPRESSION: Persistent left greater than right pleural effusions and airspace disease. The patient's left effusion shows some increase since the most recent exam. Bilateral pulmonary nodules as seen on the prior study. Electronically Signed   By: Inge Rise M.D.   On: 08/28/2016 12:11   Dg Chest 2 View  Result Date: 08/14/2016 CLINICAL DATA:  PT c/o SOB upon exertion recently, as well as decrease in appetite. Pt was taken off of chemo pills today due to health reasons. Pt has cancer of right kidney. H/o PNA, Pulmonary Embolism, Type II Diabetes. Former smoker. EXAM: CHEST  2 VIEW COMPARISON:  05/26/2016 FINDINGS: Moderate to large pleural effusion obscures hemidiaphragm and left heart border, significantly increased from the prior exam. No right pleural effusion. No evidence of pneumonia or pulmonary edema. Cardiac silhouette is partly obscured but grossly normal in size. No mediastinal or hilar masses. No convincing adenopathy. No pneumothorax. Skeletal structures are  intact. IMPRESSION: 1. Moderate to large left pleural effusion. This has significantly increased in size from the prior study. 2. No right pleural effusion. No evidence of pneumonia or pulmonary edema. Electronically Signed   By: Lajean Manes M.D.   On: 08/14/2016 18:05   Ct Chest Wo Contrast  Result Date: 08/28/2016 CLINICAL DATA:  Pleural effusion, PleurX tube not draining, worsened shortness of breath with exertion, history of RIGHT renal cancer, hypertension, former emboli EXAM: CT CHEST WITHOUT CONTRAST TECHNIQUE: Multidetector CT imaging of the chest was performed following the standard protocol without IV contrast. Sagittal and coronal MPR images reconstructed from axial data set. COMPARISON:  08/16/2016 FINDINGS: Cardiovascular: Atherosclerotic calcifications aorta and coronary arteries. Minimal pericardial effusion. Mediastinum/Nodes: Mediastinal adenopathy slightly increased since previous exam. AP window node 21 mm short axis image 51 previously 18 mm. RIGHT paratracheal node 14 mm short axis image 42  previously 10 mm. Subcarinal node 15 mm short axis image 65 previously 11 mm. Esophagus unremarkable. Base of cervical region normal appearance. Lungs/Pleura: PleurX catheter coiled in the inferolateral LEFT hemithorax. Increased LEFT pleural effusion since previous exam, partially loculated. Multiple septations are identified as well as larger more rounded areas of opacification within the pleural effusion likely representing malignant pleural deposits. Increased atelectasis of LEFT upper and LEFT lower lobes. Pulmonary metastases again visualized, largest RIGHT mid lung 11 mm short axis image 44 and 14 mm LEFT apex image 24. No pneumothorax. Upper Abdomen: Complex cystic and solid mass consistent with known neoplasm at the upper LEFT kidney, 8.9 x 4.6 cm image 155. Enlarged LEFT adrenal gland 2.4 x 1.3 cm image 134 little changed. Pancreas appears mildly enlarged and slightly ill-defined at the  pancreatic body unchanged since the previous study suggesting pancreatitis. No additional upper abdominal abnormalities. Surgically absent RIGHT kidney. Thickening of RIGHT adrenal gland without discrete mass. Musculoskeletal: No osseous metastases. IMPRESSION: Large malignant LEFT pleural effusion with septations and tumor nodularity, increased in size since previous exam. Moderate RIGHT pleural effusion, increased since previous exam. Pulmonary metastases again identified with increased compressive atelectasis of both lungs. Increased sizes of mediastinal adenopathy. Complex cystic and solid LEFT renal neoplasm and probable LEFT adrenal metastasis. Suspected pancreatitis changes again seen. Aortic atherosclerosis and coronary arterial calcification with minimal pericardial effusion. Electronically Signed   By: Lavonia Dana M.D.   On: 08/28/2016 15:13   Ct Chest Wo Contrast  Result Date: 08/16/2016 CLINICAL DATA:  Drop in hemoglobin post thoracentesis. EXAM: CT CHEST WITHOUT CONTRAST TECHNIQUE: Multidetector CT imaging of the chest was performed following the standard protocol without IV contrast. COMPARISON:  06/17/2016 FINDINGS: Cardiovascular: Extensive aortic and coronary atheromatous calcifications. No aneurysm. Small pericardial effusion. Mediastinum/Nodes: 18 mm enlarging left suprahilar node, previously 14 mm. 1 cm pretracheal node, previously 7 mm. No axillary adenopathy. Lungs/Pleura: No pneumothorax. Small layering right pleural effusion. Moderately large hyperdense residual/recurrent left pleural effusion probably hemorrhagic. compressive atelectasis throughout the basilar segments of left lower lobe and inferior lingula. Bilateral pulmonary nodules with slight progression. Index right lower lobe nodule 19 mm image 99/5, previously 17 mm. Posterior 13 mm left upper lobe nodule image 50/5, previously 11 mm by my measurement. Additional smaller nodules bilaterally, several cavitary. Upper Abdomen:  Previous right nephrectomy. Mixed attenuation left renal mass, incompletely visualized. Progressive inflammatory/edematous changes around pancreatic head and body, incompletely seen. Stable left adrenal enlargement. Musculoskeletal: Negative for fracture. IMPRESSION: 1. Moderately large  residual/recurrent hemorrhagic left effusion. 2. Progression of bilateral pulmonary nodules and mediastinal adenopathy. 3. Small pericardial and right pleural effusions. 4. New peripancreatic inflammatory/edematous changes suggesting pancreatitis. 5. Aortic and coronary atherosclerosis. 6. Left renal and left adrenal masses again noted. Electronically Signed   By: Lucrezia Europe M.D.   On: 08/16/2016 10:46   Ir Guided Niel Hummer W Catheter Placement  Result Date: 08/25/2016 INDICATION: Recurrent malignant left pleural effusion EXAM: ULTRASOUND AND FLUOROSCOPIC LEFT TUNNELED PLEURAL DRAIN (PLEURX CATHETER) MEDICATIONS: Ancef 2 g, administered within 1 hour of the procedure ANESTHESIA/SEDATION: Fentanyl 100 mcg IV; Versed 2.0 mg IV Moderate Sedation Time:  20 minutes The patient was continuously monitored during the procedure by the interventional radiology nurse under my direct supervision. COMPLICATIONS: None immediate. PROCEDURE: Informed written consent was obtained from the patient after a thorough discussion of the procedural risks, benefits and alternatives. All questions were addressed. Maximal Sterile Barrier Technique was utilized including caps, mask, sterile gowns, sterile gloves, sterile drape, hand hygiene  and skin antiseptic. A timeout was performed prior to the initiation of the procedure. Previous imaging reviewed. Right lower chest marked in the mid axillary line through a lower intercostal space. Under sterile conditions and local anesthesia, ultrasound percutaneous access performed of the left pleural space. Needle position confirmed with ultrasound. Images obtained for documentation. There was return of bloody pleural  fluid. Amplatz guidewire inserted. In the adjacent soft tissues, a subcutaneous tunnel was created. The PleurX catheter was tunneled subcutaneously to the puncture site and advanced into the left chest through the valved pleural sheath. Position confirmed with fluoroscopy. Thoracentesis performed yielding 5 minutes 50 cc bloody pleural fluid. Catheter secured with a Prolene suture. Entry site closed with subcuticular Vicryl suture and derma bond. Sterile dressing applied. No immediate complication. Patient tolerated the procedure well. IMPRESSION: Successful ultrasound and fluoroscopic 15.5 French left tunneled pleural drain (PleurX catheter). 550 cc left thoracentesis performed after insertion. Electronically Signed   By: Jerilynn Mages.  Shick M.D.   On: 08/25/2016 10:44   Dg Chest Port 1 View  Result Date: 08/21/2016 CLINICAL DATA:  Post thoracentesis EXAM: PORTABLE CHEST 1 VIEW COMPARISON:  08/21/2016 FINDINGS: Decreased left pleural effusion with moderate residual. Improved aeration of left upper lobe. No definitive pneumothorax. Small right pleural effusion. Persistent bibasilar left greater than right atelectasis or pneumonia. Mild cardiomegaly. Aortic atherosclerosis. IMPRESSION: 1. Decreased pleural effusion on the left, no pneumothorax is seen. Moderate residual left pleural effusion. 2. Otherwise no change in small right pleural effusion and dense left greater than right bilateral lower lung consolidations which may reflect atelectasis or pneumonia Electronically Signed   By: Donavan Foil M.D.   On: 08/21/2016 14:43   Dg Chest Port 1 View  Result Date: 08/21/2016 CLINICAL DATA:  Dyspnea EXAM: PORTABLE CHEST 1 VIEW COMPARISON:  08/19/2016 chest radiograph. FINDINGS: Stable cardiomediastinal silhouette with mild cardiomegaly and aortic atherosclerosis. No pneumothorax. Small right pleural effusion appears slightly increased. Large left pleural effusion appears stable. Stable near complete opacification of the  left lung with minimal preserved aeration in the left upper lobe. Worsening patchy right lung base opacity. IMPRESSION: 1. Stable large left pleural effusion. Increased small right pleural effusion . 2. Stable near complete left lung opacification with minimal preserved aeration in the left upper lobe. Worsening patchy right lung base opacity. Findings could represent atelectasis, with pneumonia or aspiration not excluded. 3. Stable cardiomegaly. 4. Aortic atherosclerosis. Electronically Signed   By: Ilona Sorrel M.D.   On: 08/21/2016 11:53   Dg Chest Port 1 View  Result Date: 08/19/2016 CLINICAL DATA:  Post left thoracentesis EXAM: PORTABLE CHEST 1 VIEW COMPARISON:  08/19/2016 FINDINGS: Continued large left pleural effusion, slightly decreased since prior study with slight increase in aeration of the left lung. No pneumothorax. Small right pleural effusion with right lower lobe atelectasis or infiltrate is stable. IMPRESSION: Slight decreased size of the large left pleural effusion following thoracentesis. No pneumothorax. Otherwise no change. Electronically Signed   By: Rolm Baptise M.D.   On: 08/19/2016 11:46   Dg Chest Port 1 View  Result Date: 08/19/2016 CLINICAL DATA:  Pleural effusion. EXAM: PORTABLE CHEST 1 VIEW COMPARISON:  08/18/2016.  CT 08/16/2016. FINDINGS: Heart size stable. Persistent large left pleural effusion, unchanged from prior exam. Small right pleural effusion again noted unchanged. Basilar atelectasis. Pulmonary nodules present best identified by prior CT. No pneumothorax. IMPRESSION: 1. Stable chest with large left pleural effusion and small right pleural effusion. 2. Basilar atelectasis. Pulmonary nodules present best identified by prior CT  of 05/ 20/2018 . Electronically Signed   By: Marcello Moores  Register   On: 08/19/2016 07:16   Dg Chest Port 1 View  Result Date: 08/18/2016 CLINICAL DATA:  Pleural effusion. EXAM: PORTABLE CHEST 1 VIEW COMPARISON:  08/17/2016.  CT 08/16/2016.  FINDINGS: Heart size stable Persistent large left pleural effusion, unchanged from prior exam. Small right pleural effusion again noted without interim change . Bibasilar atelectasis. Pulmonary nodules best identified by prior CT of 08/16/2016 . No pneumothorax. IMPRESSION: 1. Persistent large left pleural effusion. Small right pleural effusion. 2. Bibasilar atelectasis. Pulmonary nodules best identified by prior CT of 08/16/2016. Electronically Signed   By: Marcello Moores  Register   On: 08/18/2016 06:56   Dg Chest Port 1 View  Result Date: 08/17/2016 CLINICAL DATA:  66 year old female left-sided pleural effusion. Subsequent encounter. EXAM: PORTABLE CHEST 1 VIEW COMPARISON:  08/16/2016 CT and chest x-ray. FINDINGS: Large left-sided and small right-sided pleural effusion. Almost complete opacification left hemithorax. Left-sided pleural effusion has increased in size since prior exam. CT detected pulmonary nodules and adenopathy not as well delineated on present plain film exam. IMPRESSION: Increase in size of large left-sided pleural effusion. Almost complete opacification left hemithorax. Small right-sided pleural effusion. Electronically Signed   By: Genia Del M.D.   On: 08/17/2016 07:21   Dg Chest Port 1 View  Result Date: 08/16/2016 CLINICAL DATA:  Pleural effusion EXAM: PORTABLE CHEST 1 VIEW COMPARISON:  08/15/2016 FINDINGS: Moderate left pleural effusion again noted with left lower lobe atelectasis. Increasing left upper lobe airspace disease. Right basilar atelectasis or infiltrate. Mild cardiomegaly. IMPRESSION: Continued moderate left pleural effusion with left lower lobe atelectasis or infiltrate and right basilar opacity. Increasing left upper lobe airspace opacity. Electronically Signed   By: Rolm Baptise M.D.   On: 08/16/2016 08:43   US Thoracentesis Asp Pleural Space W/img Guide  Result Date: 08/21/2016 INDICATION: Metastatic renal cell carcinoma, dyspnea, recurrent left pleural effusion;  request made for therapeutic left thoracentesis. EXAM: ULTRASOUND GUIDED THERAPEUTIC LEFT THORACENTESIS MEDICATIONS: None. COMPLICATIONS: None immediate. PROCEDURE: An ultrasound guided thoracentesis was thoroughly discussed with the patient and questions answered. The benefits, risks, alternatives and complications were also discussed. The patient understands and wishes to proceed with the procedure. Written consent was obtained. Ultrasound was performed to localize and mark an adequate pocket of fluid in the left chest. The area was then prepped and draped in the normal sterile fashion. 1% Lidocaine was used for local anesthesia. Under ultrasound guidance a Safe-T-Centesis catheter was introduced. Thoracentesis was performed. The catheter was removed and a dressing applied. FINDINGS: A total of approximately 1.5 liters of bloody fluid was removed. Only the above amount of fluid was removed at this time secondary to persistent patient coughing and chest discomfort. IMPRESSION: Successful ultrasound guided therapeutic left thoracentesis yielding 1.5 liters of pleural fluid. Read by: Rowe Robert, PA-C Electronically Signed   By: Sandi Mariscal M.D.   On: 08/21/2016 14:43   US Thoracentesis Asp Pleural Space W/img Guide  Result Date: 08/15/2016 INDICATION: SOB upon exertion. Cancer of the right kidney. Left pleural effusion. EXAM: ULTRASOUND GUIDED LEFT THORACENTESIS MEDICATIONS: 1% Lidocaine = 10 mL. COMPLICATIONS: None immediate. PROCEDURE: An ultrasound guided thoracentesis was thoroughly discussed with the patient and questions answered. The benefits, risks, alternatives and complications were also discussed. The patient understands and wishes to proceed with the procedure. Written consent was obtained. Ultrasound was performed to localize and mark an adequate pocket of fluid in the left chest. The area was then prepped and  draped in the normal sterile fashion. 1% Lidocaine was used for local anesthesia. Under  ultrasound guidance a catheter was introduced. Thoracentesis was performed. The catheter was removed and a dressing applied. FINDINGS: A total of approximately 2 liters of bloody fluid was removed. Samples were sent to the laboratory as requested by the clinical team. IMPRESSION: Successful ultrasound guided left thoracentesis yielding 2 liters of pleural fluid. Post procedure radiograph shows no pneumothorax. Read by:  Gareth Eagle, PA-C Electronically Signed   By: Lucrezia Europe M.D.   On: 08/15/2016 13:52     LOS: 3 days   Oren Binet, MD  [facility] Pager:[phone]  If 7PM-7AM, please contact night-coverage [email or web address] Password [email or web address] 08/31/2016, 9:21 AM

## 2016-08-31 NOTE — Care Management Note (Addendum)
Case Management Note  Patient Details  Name: Laura Neal MRN: [id number] Date of Birth: 11-20-1950  Subjective/Objective:  Pt presented for recurrent shortness of breath-was found to have a large recurrent  pleural effusion. Pt has a Pleurx catheter in place that will be removed before d/c. Pt is from home with family support. Plan will be for home with Hospice Services. Pt has DME BSC and RW. Per pt she will need Hospital Bed-Patient needs low air loss mattress at home due to the severity of her wounds, Over bed Table, Wheel Chair and Oxygen. Hospice Agency List provided and pt chose [facility] [place]. [facility] to arrange DME for home.                     Action/Plan: CM did make referral to Amy with [facility] Liaison to contact CM in regards if [facility] can service the patient. CM awaiting phone call. Per pt she will utilize private vehicle for transportation home. Please see wound Care Notes for plan of care for wound therapy. CM will continue to monitor.   Expected Discharge Date:              Expected Discharge Plan:  Home w Hospice Care  In-House Referral:  Hospice / Palliative Care  Discharge planning Services  CM Consult  Post Acute Care Choice:  Hospice Choice offered to:  Patient  DME Arranged:  Overbed table, Oxygen, Wheelchair manual, Hospital bed DME Agency:  [facility] [place]  HH Arranged:  RN, Nurse's Aide [facility] Agency:  [facility] [place]  Status of Service:  Completed, signed off  If discussed at [employer] [facility] [employer], dates discussed:    Additional Comments: 1613 08-31-16 CM did call family and [facility] did call to make them aware that DME will be delivered to home between 4:00- 8:00 pm. Family to provide transportation home. No further needs from CM at this time.    1410 08-31-16 Laura Krauss, RN,BSN [id number]  Pt plan for d/c home today via private vehicle. [facility] RN to assess patient on  09-01-16. DME to be delivered home and sister to receive. CM will call sister to verify time.  Laura Roys, RN 08/31/2016, 10:31 AM

## 2016-08-31 NOTE — Discharge Summary (Signed)
PATIENT DETAILS Name: Laura Neal Age: 66 y.o. Sex: female Date of Birth: Aug 23, 1950 MRN: [id number]. Admitting Physician: No admitting provider for patient encounter. PYK:DXIPJA, [name], MD  Admit Date: 08/28/2016 Discharge date: 08/31/2016  Recommendations for Outpatient Follow-up:  1. Follow up with PCP in 1-2 weeks 2. Discharge home with home hospice 3. Optimize comfort measures and pain control 4. Slowly discontinue/minimize medications as much as possible depending on clinical course  Admitted From:  Home   Disposition: Home with [place]: No  Equipment/Devices: None  Discharge Condition: Guarded/hospice  CODE STATUS:  DNR  Diet recommendation:  Heart Healthy / Carb Modified  Brief Summary: See H&P, Labs, Consult and Test reports for all details in brief, Patient is a 66 y.o. 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 She presented to the ED on 6/1 recurrent shortness of breath-was found to have a large recurrent (likely malignant) pleural effusion. She was subsequently transferred to [facility] for cardiothoracic surgery evaluation. See below for further details  Brief Hospital Course: Malignant/recurrent pleural effusion:  has recurrent malignant pleural effusion in spite of a Pleurx catheter placement last week-she unfortunately has now developed numerous loculations and trabeculations. The pleural catheter is also not draining well. Seen by cardiothoracic surgery this admission-not felt to be a candidate for aggressive care. Subsequently, we had extensive discussions with the patient and family-given very poor overall prognoses-patient is now a DO NOT RESUSCITATE, with plans to discharge home with hospice care. Pleurx catheter is no  longer draining-it is causing more pain and discomfort to the patient and she requested this be discontinued-this was removed by IR on 6/4. Plans are to discharge home later today with hospice once all equipments have been delivered to her house. I spoke with patient's primary oncologist (Dr Alen Blew) over the phone this morning-he is fully agreeable with above noted plan.  Metastatic renal cell carcinoma: Complicated by recurrent malignant pleural effusion in spite of a Pleurx catheter, spoke with patient's primary oncologist today, he is fully agreeable with plans to transition to hospice care. She no longer is on chemotherapy per oncology note on 5/24.   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  Thrombocytosis/leukocytosis: Likely reactive-due to underlying malignancy, doubt infection.   Anemia: Slight decrease in hemoglobin-but no overt bleeding-likely secondary to chronic disease/malignancy. Follow periodically.  DM-2: CBGs stable, continue 15 units of Lantus daily and SSI.   Dyslipidemia: Continue statin  Chronic kidney disease stage III: Creatinine close to usual baseline-follow.  Moderate protein calorie malnutrition: Continue supplements  Full-thickness skin breakdown over her right and left Buttocks:This was present on admission, seen by wound care evaluation-recommendations are for continued wound care on discharge.   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)   Moderate protein-calorie malnutrition (HCC)   Pleural effusion on left   Pleural effusion   Protein-calorie  malnutrition, severe   Palliative care by specialist   Discharge Instructions:  Activity:  As tolerated with Full fall precautions use walker/cane & assistance as needed  Discharge Instructions    Diet - low sodium heart healthy    Complete by:  As directed    Increase activity slowly    Complete by:  As directed      Allergies as of 08/31/2016      Reactions   Codeine Rash   Pt has tolerated oxycodone in the past      Medication List    STOP taking these medications   traMADol 50 MG tablet Commonly known as:  ULTRAM     TAKE these medications   aspirin EC 81 MG tablet Take 81 mg by mouth daily.   feeding supplement Liqd Take 1 Container by mouth 3 (three) times daily between meals.   insulin glargine 100 UNIT/ML injection Commonly known as:  LANTUS Inject 0.15 mLs (15 Units total) into the skin daily.   methocarbamol 500 MG tablet Commonly known as:  ROBAXIN Take 1 tablet (500 mg total) by mouth every 6 (six) hours as needed for muscle spasms.   mirtazapine 15 MG tablet Commonly known as:  REMERON Take 1 tablet (15 mg total) by mouth at bedtime.   oxyCODONE 20 MG/ML concentrated solution Commonly known as:  ROXICODONE INTENSOL Take 0.3-0.5 mLs (6-10 mg total) by mouth every 3 (three) hours as needed for severe pain (dyspnea).   pantoprazole 40 MG tablet Commonly known as:  PROTONIX Take 1 tablet (40 mg total) by mouth daily. What changed:  when to take this   polyethylene glycol packet Commonly known as:  MIRALAX / GLYCOLAX Take 17 g by mouth daily as needed for mild constipation.   rosuvastatin 10 MG tablet Commonly known as:  CRESTOR Take 10 mg by mouth at bedtime.      Follow-up Information    [facility] Follow up.   Specialty:  Hospice and Palliative Medicine Why:  Registered Nurse- Durable Medical Equipment to be arrangedMid [facility] Bed, Over bed Table, Wheel Chair, Oxygen.  Contact information: [place]  [id number] [phone]        Bernerd Limbo, MD. Schedule an appointment as soon as possible for a visit in 1 week(s).   Specialty:  Family Medicine Contact information: [place] [address] [phone]        Wyatt Portela, MD Follow up.   Specialty:  Oncology Why:  follow as needed Contact information: [address] [place] [address] [phone]          Allergies  Allergen Reactions  . Codeine Rash    Pt has tolerated oxycodone in the past    Consultations:   Cardiothoracic surgery and palliative care  Other Procedures/Studies: Dg Chest 1 View  Result Date: 08/15/2016 CLINICAL DATA:  Status post left thoracentesis. EXAM: CHEST 1 VIEW COMPARISON:  Aug 14, 2016 FINDINGS: The left-sided pleural effusion remains but is smaller. No pneumothorax. No other changes. IMPRESSION: Decreased size of left-sided pleural effusion after thoracentesis. No pneumothorax. Electronically Signed   By: Dorise Bullion III M.D   On: 08/15/2016 14:09   Dg Chest 2 View  Result Date: 08/28/2016 CLINICAL DATA:  Increasing shortness of breath and weakness since being discharged from the hospital yesterday. EXAM: CHEST  2 VIEW COMPARISON:  Single-view of the chest 08/21/2016. FINDINGS: Left worse than right pleural effusions and airspace disease persist. The patient's left pleural effusion has increased somewhat since the prior examination. Pulmonary nodules on the  right are identified as seen on prior exams. Known left pulmonary nodules are obscured. No pneumothorax. IMPRESSION: Persistent left greater than right pleural effusions and airspace disease. The patient's left effusion shows some increase since the most recent exam. Bilateral pulmonary nodules as seen on the prior study. Electronically Signed   By: Inge Rise M.D.   On: 08/28/2016 12:11   Dg Chest 2 View  Result Date: 08/14/2016 CLINICAL DATA:  PT c/o SOB upon exertion recently, as  well as decrease in appetite. Pt was taken off of chemo pills today due to health reasons. Pt has cancer of right kidney. H/o PNA, Pulmonary Embolism, Type II Diabetes. Former smoker. EXAM: CHEST  2 VIEW COMPARISON:  05/26/2016 FINDINGS: Moderate to large pleural effusion obscures hemidiaphragm and left heart border, significantly increased from the prior exam. No right pleural effusion. No evidence of pneumonia or pulmonary edema. Cardiac silhouette is partly obscured but grossly normal in size. No mediastinal or hilar masses. No convincing adenopathy. No pneumothorax. Skeletal structures are intact. IMPRESSION: 1. Moderate to large left pleural effusion. This has significantly increased in size from the prior study. 2. No right pleural effusion. No evidence of pneumonia or pulmonary edema. Electronically Signed   By: Lajean Manes M.D.   On: 08/14/2016 18:05   Ct Chest Wo Contrast  Result Date: 08/28/2016 CLINICAL DATA:  Pleural effusion, PleurX tube not draining, worsened shortness of breath with exertion, history of RIGHT renal cancer, hypertension, former emboli EXAM: CT CHEST WITHOUT CONTRAST TECHNIQUE: Multidetector CT imaging of the chest was performed following the standard protocol without IV contrast. Sagittal and coronal MPR images reconstructed from axial data set. COMPARISON:  08/16/2016 FINDINGS: Cardiovascular: Atherosclerotic calcifications aorta and coronary arteries. Minimal pericardial effusion. Mediastinum/Nodes: Mediastinal adenopathy slightly increased since previous exam. AP window node 21 mm short axis image 51 previously 18 mm. RIGHT paratracheal node 14 mm short axis image 42 previously 10 mm. Subcarinal node 15 mm short axis image 65 previously 11 mm. Esophagus unremarkable. Base of cervical region normal appearance. Lungs/Pleura: PleurX catheter coiled in the inferolateral LEFT hemithorax. Increased LEFT pleural effusion since previous exam, partially loculated. Multiple septations  are identified as well as larger more rounded areas of opacification within the pleural effusion likely representing malignant pleural deposits. Increased atelectasis of LEFT upper and LEFT lower lobes. Pulmonary metastases again visualized, largest RIGHT mid lung 11 mm short axis image 44 and 14 mm LEFT apex image 24. No pneumothorax. Upper Abdomen: Complex cystic and solid mass consistent with known neoplasm at the upper LEFT kidney, 8.9 x 4.6 cm image 155. Enlarged LEFT adrenal gland 2.4 x 1.3 cm image 134 little changed. Pancreas appears mildly enlarged and slightly ill-defined at the pancreatic body unchanged since the previous study suggesting pancreatitis. No additional upper abdominal abnormalities. Surgically absent RIGHT kidney. Thickening of RIGHT adrenal gland without discrete mass. Musculoskeletal: No osseous metastases. IMPRESSION: Large malignant LEFT pleural effusion with septations and tumor nodularity, increased in size since previous exam. Moderate RIGHT pleural effusion, increased since previous exam. Pulmonary metastases again identified with increased compressive atelectasis of both lungs. Increased sizes of mediastinal adenopathy. Complex cystic and solid LEFT renal neoplasm and probable LEFT adrenal metastasis. Suspected pancreatitis changes again seen. Aortic atherosclerosis and coronary arterial calcification with minimal pericardial effusion. Electronically Signed   By: Lavonia Dana M.D.   On: 08/28/2016 15:13   Ct Chest Wo Contrast  Result Date: 08/16/2016 CLINICAL DATA:  Drop in hemoglobin post thoracentesis. EXAM: CT CHEST WITHOUT CONTRAST  TECHNIQUE: Multidetector CT imaging of the chest was performed following the standard protocol without IV contrast. COMPARISON:  06/17/2016 FINDINGS: Cardiovascular: Extensive aortic and coronary atheromatous calcifications. No aneurysm. Small pericardial effusion. Mediastinum/Nodes: 18 mm enlarging left suprahilar node, previously 14 mm. 1 cm  pretracheal node, previously 7 mm. No axillary adenopathy. Lungs/Pleura: No pneumothorax. Small layering right pleural effusion. Moderately large hyperdense residual/recurrent left pleural effusion probably hemorrhagic. compressive atelectasis throughout the basilar segments of left lower lobe and inferior lingula. Bilateral pulmonary nodules with slight progression. Index right lower lobe nodule 19 mm image 99/5, previously 17 mm. Posterior 13 mm left upper lobe nodule image 50/5, previously 11 mm by my measurement. Additional smaller nodules bilaterally, several cavitary. Upper Abdomen: Previous right nephrectomy. Mixed attenuation left renal mass, incompletely visualized. Progressive inflammatory/edematous changes around pancreatic head and body, incompletely seen. Stable left adrenal enlargement. Musculoskeletal: Negative for fracture. IMPRESSION: 1. Moderately large  residual/recurrent hemorrhagic left effusion. 2. Progression of bilateral pulmonary nodules and mediastinal adenopathy. 3. Small pericardial and right pleural effusions. 4. New peripancreatic inflammatory/edematous changes suggesting pancreatitis. 5. Aortic and coronary atherosclerosis. 6. Left renal and left adrenal masses again noted. Electronically Signed   By: Lucrezia Europe M.D.   On: 08/16/2016 10:46   Ir Guided Niel Hummer W Catheter Placement  Result Date: 08/25/2016 INDICATION: Recurrent malignant left pleural effusion EXAM: ULTRASOUND AND FLUOROSCOPIC LEFT TUNNELED PLEURAL DRAIN (PLEURX CATHETER) MEDICATIONS: Ancef 2 g, administered within 1 hour of the procedure ANESTHESIA/SEDATION: Fentanyl 100 mcg IV; Versed 2.0 mg IV Moderate Sedation Time:  20 minutes The patient was continuously monitored during the procedure by the interventional radiology nurse under my direct supervision. COMPLICATIONS: None immediate. PROCEDURE: Informed written consent was obtained from the patient after a thorough discussion of the procedural risks, benefits and  alternatives. All questions were addressed. Maximal Sterile Barrier Technique was utilized including caps, mask, sterile gowns, sterile gloves, sterile drape, hand hygiene and skin antiseptic. A timeout was performed prior to the initiation of the procedure. Previous imaging reviewed. Right lower chest marked in the mid axillary line through a lower intercostal space. Under sterile conditions and local anesthesia, ultrasound percutaneous access performed of the left pleural space. Needle position confirmed with ultrasound. Images obtained for documentation. There was return of bloody pleural fluid. Amplatz guidewire inserted. In the adjacent soft tissues, a subcutaneous tunnel was created. The PleurX catheter was tunneled subcutaneously to the puncture site and advanced into the left chest through the valved pleural sheath. Position confirmed with fluoroscopy. Thoracentesis performed yielding 5 minutes 50 cc bloody pleural fluid. Catheter secured with a Prolene suture. Entry site closed with subcuticular Vicryl suture and derma bond. Sterile dressing applied. No immediate complication. Patient tolerated the procedure well. IMPRESSION: Successful ultrasound and fluoroscopic 15.5 French left tunneled pleural drain (PleurX catheter). 550 cc left thoracentesis performed after insertion. Electronically Signed   By: Jerilynn Mages.  Shick M.D.   On: 08/25/2016 10:44   Dg Chest Port 1 View  Result Date: 08/21/2016 CLINICAL DATA:  Post thoracentesis EXAM: PORTABLE CHEST 1 VIEW COMPARISON:  08/21/2016 FINDINGS: Decreased left pleural effusion with moderate residual. Improved aeration of left upper lobe. No definitive pneumothorax. Small right pleural effusion. Persistent bibasilar left greater than right atelectasis or pneumonia. Mild cardiomegaly. Aortic atherosclerosis. IMPRESSION: 1. Decreased pleural effusion on the left, no pneumothorax is seen. Moderate residual left pleural effusion. 2. Otherwise no change in small right  pleural effusion and dense left greater than right bilateral lower lung consolidations which may reflect atelectasis or pneumonia  Electronically Signed   By: Donavan Foil M.D.   On: 08/21/2016 14:43   Dg Chest Port 1 View  Result Date: 08/21/2016 CLINICAL DATA:  Dyspnea EXAM: PORTABLE CHEST 1 VIEW COMPARISON:  08/19/2016 chest radiograph. FINDINGS: Stable cardiomediastinal silhouette with mild cardiomegaly and aortic atherosclerosis. No pneumothorax. Small right pleural effusion appears slightly increased. Large left pleural effusion appears stable. Stable near complete opacification of the left lung with minimal preserved aeration in the left upper lobe. Worsening patchy right lung base opacity. IMPRESSION: 1. Stable large left pleural effusion. Increased small right pleural effusion . 2. Stable near complete left lung opacification with minimal preserved aeration in the left upper lobe. Worsening patchy right lung base opacity. Findings could represent atelectasis, with pneumonia or aspiration not excluded. 3. Stable cardiomegaly. 4. Aortic atherosclerosis. Electronically Signed   By: Ilona Sorrel M.D.   On: 08/21/2016 11:53   Dg Chest Port 1 View  Result Date: 08/19/2016 CLINICAL DATA:  Post left thoracentesis EXAM: PORTABLE CHEST 1 VIEW COMPARISON:  08/19/2016 FINDINGS: Continued large left pleural effusion, slightly decreased since prior study with slight increase in aeration of the left lung. No pneumothorax. Small right pleural effusion with right lower lobe atelectasis or infiltrate is stable. IMPRESSION: Slight decreased size of the large left pleural effusion following thoracentesis. No pneumothorax. Otherwise no change. Electronically Signed   By: Rolm Baptise M.D.   On: 08/19/2016 11:46   Dg Chest Port 1 View  Result Date: 08/19/2016 CLINICAL DATA:  Pleural effusion. EXAM: PORTABLE CHEST 1 VIEW COMPARISON:  08/18/2016.  CT 08/16/2016. FINDINGS: Heart size stable. Persistent large left  pleural effusion, unchanged from prior exam. Small right pleural effusion again noted unchanged. Basilar atelectasis. Pulmonary nodules present best identified by prior CT. No pneumothorax. IMPRESSION: 1. Stable chest with large left pleural effusion and small right pleural effusion. 2. Basilar atelectasis. Pulmonary nodules present best identified by prior CT of 05/ 20/2018 . Electronically Signed   By: Marcello Moores  Register   On: 08/19/2016 07:16   Dg Chest Port 1 View  Result Date: 08/18/2016 CLINICAL DATA:  Pleural effusion. EXAM: PORTABLE CHEST 1 VIEW COMPARISON:  08/17/2016.  CT 08/16/2016. FINDINGS: Heart size stable Persistent large left pleural effusion, unchanged from prior exam. Small right pleural effusion again noted without interim change . Bibasilar atelectasis. Pulmonary nodules best identified by prior CT of 08/16/2016 . No pneumothorax. IMPRESSION: 1. Persistent large left pleural effusion. Small right pleural effusion. 2. Bibasilar atelectasis. Pulmonary nodules best identified by prior CT of 08/16/2016. Electronically Signed   By: Marcello Moores  Register   On: 08/18/2016 06:56   Dg Chest Port 1 View  Result Date: 08/17/2016 CLINICAL DATA:  66 year old female left-sided pleural effusion. Subsequent encounter. EXAM: PORTABLE CHEST 1 VIEW COMPARISON:  08/16/2016 CT and chest x-ray. FINDINGS: Large left-sided and small right-sided pleural effusion. Almost complete opacification left hemithorax. Left-sided pleural effusion has increased in size since prior exam. CT detected pulmonary nodules and adenopathy not as well delineated on present plain film exam. IMPRESSION: Increase in size of large left-sided pleural effusion. Almost complete opacification left hemithorax. Small right-sided pleural effusion. Electronically Signed   By: Genia Del M.D.   On: 08/17/2016 07:21   Dg Chest Port 1 View  Result Date: 08/16/2016 CLINICAL DATA:  Pleural effusion EXAM: PORTABLE CHEST 1 VIEW COMPARISON:   08/15/2016 FINDINGS: Moderate left pleural effusion again noted with left lower lobe atelectasis. Increasing left upper lobe airspace disease. Right basilar atelectasis or infiltrate. Mild cardiomegaly. IMPRESSION: Continued  moderate left pleural effusion with left lower lobe atelectasis or infiltrate and right basilar opacity. Increasing left upper lobe airspace opacity. Electronically Signed   By: Rolm Baptise M.D.   On: 08/16/2016 08:43   Ir Removal Of Plural Cath W/cuff  Result Date: 08/31/2016 INDICATION: Malignant pleural effusion. Status post left-sided PleurX catheter placement 08/25/2016. Catheter now no longer draining and causing discomfort. Request removal. EXAM: REMOVAL OF TUNNELED LEFT-SIDED PLEURAL CATHETER MEDICATIONS: The patient is currently admitted to the hospital and receiving intravenous antibiotics. The antibiotics were administered within an appropriate time frame prior to the initiation of the procedure. ANESTHESIA/SEDATION: Moderate Sedation Time:  NONE The patient was continuously monitored during the procedure by the interventional radiology nurse under my direct supervision. COMPLICATIONS: None immediate. PROCEDURE: Informed written consent was obtained from the patient after a thorough discussion of the procedural risks, benefits and alternatives. All questions were addressed. Maximal Sterile Barrier Technique was utilized including caps, mask, sterile gowns, sterile gloves, sterile drape, hand hygiene and skin antiseptic. A timeout was performed prior to the initiation of the procedure. 1% plain lidocaine was injected locally at the catheter insertion site. Using gentle traction and blunt dissection, the cuff of the catheter was exposed and the catheter was removed in its entirety. Pressure was held until adequate hemostasis was achieved. A sterile dressing was applied. Patient tolerated procedure well without immediate complication. IMPRESSION: Successful removal of left-sided  tunneled pleural catheter. Read by: Ascencion Dike PA-C Electronically Signed   By: Marybelle Killings M.D.   On: 08/31/2016 12:28   US Thoracentesis Asp Pleural Space W/img Guide  Result Date: 08/21/2016 INDICATION: Metastatic renal cell carcinoma, dyspnea, recurrent left pleural effusion; request made for therapeutic left thoracentesis. EXAM: ULTRASOUND GUIDED THERAPEUTIC LEFT THORACENTESIS MEDICATIONS: None. COMPLICATIONS: None immediate. PROCEDURE: An ultrasound guided thoracentesis was thoroughly discussed with the patient and questions answered. The benefits, risks, alternatives and complications were also discussed. The patient understands and wishes to proceed with the procedure. Written consent was obtained. Ultrasound was performed to localize and mark an adequate pocket of fluid in the left chest. The area was then prepped and draped in the normal sterile fashion. 1% Lidocaine was used for local anesthesia. Under ultrasound guidance a Safe-T-Centesis catheter was introduced. Thoracentesis was performed. The catheter was removed and a dressing applied. FINDINGS: A total of approximately 1.5 liters of bloody fluid was removed. Only the above amount of fluid was removed at this time secondary to persistent patient coughing and chest discomfort. IMPRESSION: Successful ultrasound guided therapeutic left thoracentesis yielding 1.5 liters of pleural fluid. Read by: Rowe Robert, PA-C Electronically Signed   By: Sandi Mariscal M.D.   On: 08/21/2016 14:43   US Thoracentesis Asp Pleural Space W/img Guide  Result Date: 08/15/2016 INDICATION: SOB upon exertion. Cancer of the right kidney. Left pleural effusion. EXAM: ULTRASOUND GUIDED LEFT THORACENTESIS MEDICATIONS: 1% Lidocaine = 10 mL. COMPLICATIONS: None immediate. PROCEDURE: An ultrasound guided thoracentesis was thoroughly discussed with the patient and questions answered. The benefits, risks, alternatives and complications were also discussed. The patient  understands and wishes to proceed with the procedure. Written consent was obtained. Ultrasound was performed to localize and mark an adequate pocket of fluid in the left chest. The area was then prepped and draped in the normal sterile fashion. 1% Lidocaine was used for local anesthesia. Under ultrasound guidance a catheter was introduced. Thoracentesis was performed. The catheter was removed and a dressing applied. FINDINGS: A total of approximately 2 liters of bloody fluid was  removed. Samples were sent to the laboratory as requested by the clinical team. IMPRESSION: Successful ultrasound guided left thoracentesis yielding 2 liters of pleural fluid. Post procedure radiograph shows no pneumothorax. Read by:  Gareth Eagle, PA-C Electronically Signed   By: Lucrezia Europe M.D.   On: 08/15/2016 13:52      TODAY-DAY OF DISCHARGE:  Subjective:   Laura Neal today Denies any chest pain, nausea vomiting or diarrhea. She continues to have exertional dyspnea-being discharged home with hospice care.  Objective:   Blood pressure (!) 106/59, pulse (!) 101, temperature 98.6 F (37 C), temperature source Oral, resp. rate 16, height 5\' 8"  (1.727 m), weight 87.2 kg (192 lb 3.2 oz), SpO2 98 %.  Intake/Output Summary (Last 24 hours) at 08/31/16 1415 Last data filed at 08/31/16 0900  Gross per 24 hour  Intake              600 ml  Output              100 ml  Net              500 ml   Filed Weights   08/29/16 0506 08/30/16 0447 08/31/16 0512  Weight: 88.1 kg (194 lb 3.2 oz) 87.2 kg (192 lb 4.8 oz) 87.2 kg (192 lb 3.2 oz)    Exam: Awake Alert, Oriented *3, No new F.N deficits, Normal affect [place].AT,PERRAL Supple Neck,No JVD, No cervical lymphadenopathy appriciated.  Symmetrical Chest wall movement, Good air movement bilaterally, CTAB RRR,No Gallops,Rubs or new Murmurs, No Parasternal Heave +ve B.Sounds, Abd Soft, Non tender, No organomegaly appriciated, No rebound -guarding or rigidity. No Cyanosis, Clubbing  or edema, No new Rash or bruise   PERTINENT RADIOLOGIC STUDIES: Dg Chest 1 View  Result Date: 08/15/2016 CLINICAL DATA:  Status post left thoracentesis. EXAM: CHEST 1 VIEW COMPARISON:  Aug 14, 2016 FINDINGS: The left-sided pleural effusion remains but is smaller. No pneumothorax. No other changes. IMPRESSION: Decreased size of left-sided pleural effusion after thoracentesis. No pneumothorax. Electronically Signed   By: Dorise Bullion III M.D   On: 08/15/2016 14:09   Dg Chest 2 View  Result Date: 08/28/2016 CLINICAL DATA:  Increasing shortness of breath and weakness since being discharged from the hospital yesterday. EXAM: CHEST  2 VIEW COMPARISON:  Single-view of the chest 08/21/2016. FINDINGS: Left worse than right pleural effusions and airspace disease persist. The patient's left pleural effusion has increased somewhat since the prior examination. Pulmonary nodules on the right are identified as seen on prior exams. Known left pulmonary nodules are obscured. No pneumothorax. IMPRESSION: Persistent left greater than right pleural effusions and airspace disease. The patient's left effusion shows some increase since the most recent exam. Bilateral pulmonary nodules as seen on the prior study. Electronically Signed   By: Inge Rise M.D.   On: 08/28/2016 12:11   Dg Chest 2 View  Result Date: 08/14/2016 CLINICAL DATA:  PT c/o SOB upon exertion recently, as well as decrease in appetite. Pt was taken off of chemo pills today due to health reasons. Pt has cancer of right kidney. H/o PNA, Pulmonary Embolism, Type II Diabetes. Former smoker. EXAM: CHEST  2 VIEW COMPARISON:  05/26/2016 FINDINGS: Moderate to large pleural effusion obscures hemidiaphragm and left heart border, significantly increased from the prior exam. No right pleural effusion. No evidence of pneumonia or pulmonary edema. Cardiac silhouette is partly obscured but grossly normal in size. No mediastinal or hilar masses. No convincing  adenopathy. No pneumothorax. Skeletal structures are intact.  IMPRESSION: 1. Moderate to large left pleural effusion. This has significantly increased in size from the prior study. 2. No right pleural effusion. No evidence of pneumonia or pulmonary edema. Electronically Signed   By: Lajean Manes M.D.   On: 08/14/2016 18:05   Ct Chest Wo Contrast  Result Date: 08/28/2016 CLINICAL DATA:  Pleural effusion, PleurX tube not draining, worsened shortness of breath with exertion, history of RIGHT renal cancer, hypertension, former emboli EXAM: CT CHEST WITHOUT CONTRAST TECHNIQUE: Multidetector CT imaging of the chest was performed following the standard protocol without IV contrast. Sagittal and coronal MPR images reconstructed from axial data set. COMPARISON:  08/16/2016 FINDINGS: Cardiovascular: Atherosclerotic calcifications aorta and coronary arteries. Minimal pericardial effusion. Mediastinum/Nodes: Mediastinal adenopathy slightly increased since previous exam. AP window node 21 mm short axis image 51 previously 18 mm. RIGHT paratracheal node 14 mm short axis image 42 previously 10 mm. Subcarinal node 15 mm short axis image 65 previously 11 mm. Esophagus unremarkable. Base of cervical region normal appearance. Lungs/Pleura: PleurX catheter coiled in the inferolateral LEFT hemithorax. Increased LEFT pleural effusion since previous exam, partially loculated. Multiple septations are identified as well as larger more rounded areas of opacification within the pleural effusion likely representing malignant pleural deposits. Increased atelectasis of LEFT upper and LEFT lower lobes. Pulmonary metastases again visualized, largest RIGHT mid lung 11 mm short axis image 44 and 14 mm LEFT apex image 24. No pneumothorax. Upper Abdomen: Complex cystic and solid mass consistent with known neoplasm at the upper LEFT kidney, 8.9 x 4.6 cm image 155. Enlarged LEFT adrenal gland 2.4 x 1.3 cm image 134 little changed. Pancreas appears  mildly enlarged and slightly ill-defined at the pancreatic body unchanged since the previous study suggesting pancreatitis. No additional upper abdominal abnormalities. Surgically absent RIGHT kidney. Thickening of RIGHT adrenal gland without discrete mass. Musculoskeletal: No osseous metastases. IMPRESSION: Large malignant LEFT pleural effusion with septations and tumor nodularity, increased in size since previous exam. Moderate RIGHT pleural effusion, increased since previous exam. Pulmonary metastases again identified with increased compressive atelectasis of both lungs. Increased sizes of mediastinal adenopathy. Complex cystic and solid LEFT renal neoplasm and probable LEFT adrenal metastasis. Suspected pancreatitis changes again seen. Aortic atherosclerosis and coronary arterial calcification with minimal pericardial effusion. Electronically Signed   By: Lavonia Dana M.D.   On: 08/28/2016 15:13   Ct Chest Wo Contrast  Result Date: 08/16/2016 CLINICAL DATA:  Drop in hemoglobin post thoracentesis. EXAM: CT CHEST WITHOUT CONTRAST TECHNIQUE: Multidetector CT imaging of the chest was performed following the standard protocol without IV contrast. COMPARISON:  06/17/2016 FINDINGS: Cardiovascular: Extensive aortic and coronary atheromatous calcifications. No aneurysm. Small pericardial effusion. Mediastinum/Nodes: 18 mm enlarging left suprahilar node, previously 14 mm. 1 cm pretracheal node, previously 7 mm. No axillary adenopathy. Lungs/Pleura: No pneumothorax. Small layering right pleural effusion. Moderately large hyperdense residual/recurrent left pleural effusion probably hemorrhagic. compressive atelectasis throughout the basilar segments of left lower lobe and inferior lingula. Bilateral pulmonary nodules with slight progression. Index right lower lobe nodule 19 mm image 99/5, previously 17 mm. Posterior 13 mm left upper lobe nodule image 50/5, previously 11 mm by my measurement. Additional smaller nodules  bilaterally, several cavitary. Upper Abdomen: Previous right nephrectomy. Mixed attenuation left renal mass, incompletely visualized. Progressive inflammatory/edematous changes around pancreatic head and body, incompletely seen. Stable left adrenal enlargement. Musculoskeletal: Negative for fracture. IMPRESSION: 1. Moderately large  residual/recurrent hemorrhagic left effusion. 2. Progression of bilateral pulmonary nodules and mediastinal adenopathy. 3. Small pericardial and right pleural  effusions. 4. New peripancreatic inflammatory/edematous changes suggesting pancreatitis. 5. Aortic and coronary atherosclerosis. 6. Left renal and left adrenal masses again noted. Electronically Signed   By: Lucrezia Europe M.D.   On: 08/16/2016 10:46   Ir Guided Niel Hummer W Catheter Placement  Result Date: 08/25/2016 INDICATION: Recurrent malignant left pleural effusion EXAM: ULTRASOUND AND FLUOROSCOPIC LEFT TUNNELED PLEURAL DRAIN (PLEURX CATHETER) MEDICATIONS: Ancef 2 g, administered within 1 hour of the procedure ANESTHESIA/SEDATION: Fentanyl 100 mcg IV; Versed 2.0 mg IV Moderate Sedation Time:  20 minutes The patient was continuously monitored during the procedure by the interventional radiology nurse under my direct supervision. COMPLICATIONS: None immediate. PROCEDURE: Informed written consent was obtained from the patient after a thorough discussion of the procedural risks, benefits and alternatives. All questions were addressed. Maximal Sterile Barrier Technique was utilized including caps, mask, sterile gowns, sterile gloves, sterile drape, hand hygiene and skin antiseptic. A timeout was performed prior to the initiation of the procedure. Previous imaging reviewed. Right lower chest marked in the mid axillary line through a lower intercostal space. Under sterile conditions and local anesthesia, ultrasound percutaneous access performed of the left pleural space. Needle position confirmed with ultrasound. Images obtained for  documentation. There was return of bloody pleural fluid. Amplatz guidewire inserted. In the adjacent soft tissues, a subcutaneous tunnel was created. The PleurX catheter was tunneled subcutaneously to the puncture site and advanced into the left chest through the valved pleural sheath. Position confirmed with fluoroscopy. Thoracentesis performed yielding 5 minutes 50 cc bloody pleural fluid. Catheter secured with a Prolene suture. Entry site closed with subcuticular Vicryl suture and derma bond. Sterile dressing applied. No immediate complication. Patient tolerated the procedure well. IMPRESSION: Successful ultrasound and fluoroscopic 15.5 French left tunneled pleural drain (PleurX catheter). 550 cc left thoracentesis performed after insertion. Electronically Signed   By: Jerilynn Mages.  Shick M.D.   On: 08/25/2016 10:44   Dg Chest Port 1 View  Result Date: 08/21/2016 CLINICAL DATA:  Post thoracentesis EXAM: PORTABLE CHEST 1 VIEW COMPARISON:  08/21/2016 FINDINGS: Decreased left pleural effusion with moderate residual. Improved aeration of left upper lobe. No definitive pneumothorax. Small right pleural effusion. Persistent bibasilar left greater than right atelectasis or pneumonia. Mild cardiomegaly. Aortic atherosclerosis. IMPRESSION: 1. Decreased pleural effusion on the left, no pneumothorax is seen. Moderate residual left pleural effusion. 2. Otherwise no change in small right pleural effusion and dense left greater than right bilateral lower lung consolidations which may reflect atelectasis or pneumonia Electronically Signed   By: Donavan Foil M.D.   On: 08/21/2016 14:43   Dg Chest Port 1 View  Result Date: 08/21/2016 CLINICAL DATA:  Dyspnea EXAM: PORTABLE CHEST 1 VIEW COMPARISON:  08/19/2016 chest radiograph. FINDINGS: Stable cardiomediastinal silhouette with mild cardiomegaly and aortic atherosclerosis. No pneumothorax. Small right pleural effusion appears slightly increased. Large left pleural effusion appears  stable. Stable near complete opacification of the left lung with minimal preserved aeration in the left upper lobe. Worsening patchy right lung base opacity. IMPRESSION: 1. Stable large left pleural effusion. Increased small right pleural effusion . 2. Stable near complete left lung opacification with minimal preserved aeration in the left upper lobe. Worsening patchy right lung base opacity. Findings could represent atelectasis, with pneumonia or aspiration not excluded. 3. Stable cardiomegaly. 4. Aortic atherosclerosis. Electronically Signed   By: Ilona Sorrel M.D.   On: 08/21/2016 11:53   Dg Chest Port 1 View  Result Date: 08/19/2016 CLINICAL DATA:  Post left thoracentesis EXAM: PORTABLE CHEST 1 VIEW COMPARISON:  08/19/2016 FINDINGS: Continued large left pleural effusion, slightly decreased since prior study with slight increase in aeration of the left lung. No pneumothorax. Small right pleural effusion with right lower lobe atelectasis or infiltrate is stable. IMPRESSION: Slight decreased size of the large left pleural effusion following thoracentesis. No pneumothorax. Otherwise no change. Electronically Signed   By: Rolm Baptise M.D.   On: 08/19/2016 11:46   Dg Chest Port 1 View  Result Date: 08/19/2016 CLINICAL DATA:  Pleural effusion. EXAM: PORTABLE CHEST 1 VIEW COMPARISON:  08/18/2016.  CT 08/16/2016. FINDINGS: Heart size stable. Persistent large left pleural effusion, unchanged from prior exam. Small right pleural effusion again noted unchanged. Basilar atelectasis. Pulmonary nodules present best identified by prior CT. No pneumothorax. IMPRESSION: 1. Stable chest with large left pleural effusion and small right pleural effusion. 2. Basilar atelectasis. Pulmonary nodules present best identified by prior CT of 05/ 20/2018 . Electronically Signed   By: Marcello Moores  Register   On: 08/19/2016 07:16   Dg Chest Port 1 View  Result Date: 08/18/2016 CLINICAL DATA:  Pleural effusion. EXAM: PORTABLE CHEST 1  VIEW COMPARISON:  08/17/2016.  CT 08/16/2016. FINDINGS: Heart size stable Persistent large left pleural effusion, unchanged from prior exam. Small right pleural effusion again noted without interim change . Bibasilar atelectasis. Pulmonary nodules best identified by prior CT of 08/16/2016 . No pneumothorax. IMPRESSION: 1. Persistent large left pleural effusion. Small right pleural effusion. 2. Bibasilar atelectasis. Pulmonary nodules best identified by prior CT of 08/16/2016. Electronically Signed   By: Marcello Moores  Register   On: 08/18/2016 06:56   Dg Chest Port 1 View  Result Date: 08/17/2016 CLINICAL DATA:  66 year old female left-sided pleural effusion. Subsequent encounter. EXAM: PORTABLE CHEST 1 VIEW COMPARISON:  08/16/2016 CT and chest x-ray. FINDINGS: Large left-sided and small right-sided pleural effusion. Almost complete opacification left hemithorax. Left-sided pleural effusion has increased in size since prior exam. CT detected pulmonary nodules and adenopathy not as well delineated on present plain film exam. IMPRESSION: Increase in size of large left-sided pleural effusion. Almost complete opacification left hemithorax. Small right-sided pleural effusion. Electronically Signed   By: Genia Del M.D.   On: 08/17/2016 07:21   Dg Chest Port 1 View  Result Date: 08/16/2016 CLINICAL DATA:  Pleural effusion EXAM: PORTABLE CHEST 1 VIEW COMPARISON:  08/15/2016 FINDINGS: Moderate left pleural effusion again noted with left lower lobe atelectasis. Increasing left upper lobe airspace disease. Right basilar atelectasis or infiltrate. Mild cardiomegaly. IMPRESSION: Continued moderate left pleural effusion with left lower lobe atelectasis or infiltrate and right basilar opacity. Increasing left upper lobe airspace opacity. Electronically Signed   By: Rolm Baptise M.D.   On: 08/16/2016 08:43   Ir Removal Of Plural Cath W/cuff  Result Date: 08/31/2016 INDICATION: Malignant pleural effusion. Status post  left-sided PleurX catheter placement 08/25/2016. Catheter now no longer draining and causing discomfort. Request removal. EXAM: REMOVAL OF TUNNELED LEFT-SIDED PLEURAL CATHETER MEDICATIONS: The patient is currently admitted to the hospital and receiving intravenous antibiotics. The antibiotics were administered within an appropriate time frame prior to the initiation of the procedure. ANESTHESIA/SEDATION: Moderate Sedation Time:  NONE The patient was continuously monitored during the procedure by the interventional radiology nurse under my direct supervision. COMPLICATIONS: None immediate. PROCEDURE: Informed written consent was obtained from the patient after a thorough discussion of the procedural risks, benefits and alternatives. All questions were addressed. Maximal Sterile Barrier Technique was utilized including caps, mask, sterile gowns, sterile gloves, sterile drape, hand hygiene and skin antiseptic. A timeout  was performed prior to the initiation of the procedure. 1% plain lidocaine was injected locally at the catheter insertion site. Using gentle traction and blunt dissection, the cuff of the catheter was exposed and the catheter was removed in its entirety. Pressure was held until adequate hemostasis was achieved. A sterile dressing was applied. Patient tolerated procedure well without immediate complication. IMPRESSION: Successful removal of left-sided tunneled pleural catheter. Read by: Ascencion Dike PA-C Electronically Signed   By: Marybelle Killings M.D.   On: 08/31/2016 12:28   US Thoracentesis Asp Pleural Space W/img Guide  Result Date: 08/21/2016 INDICATION: Metastatic renal cell carcinoma, dyspnea, recurrent left pleural effusion; request made for therapeutic left thoracentesis. EXAM: ULTRASOUND GUIDED THERAPEUTIC LEFT THORACENTESIS MEDICATIONS: None. COMPLICATIONS: None immediate. PROCEDURE: An ultrasound guided thoracentesis was thoroughly discussed with the patient and questions answered. The  benefits, risks, alternatives and complications were also discussed. The patient understands and wishes to proceed with the procedure. Written consent was obtained. Ultrasound was performed to localize and mark an adequate pocket of fluid in the left chest. The area was then prepped and draped in the normal sterile fashion. 1% Lidocaine was used for local anesthesia. Under ultrasound guidance a Safe-T-Centesis catheter was introduced. Thoracentesis was performed. The catheter was removed and a dressing applied. FINDINGS: A total of approximately 1.5 liters of bloody fluid was removed. Only the above amount of fluid was removed at this time secondary to persistent patient coughing and chest discomfort. IMPRESSION: Successful ultrasound guided therapeutic left thoracentesis yielding 1.5 liters of pleural fluid. Read by: Rowe Robert, PA-C Electronically Signed   By: Sandi Mariscal M.D.   On: 08/21/2016 14:43   US Thoracentesis Asp Pleural Space W/img Guide  Result Date: 08/15/2016 INDICATION: SOB upon exertion. Cancer of the right kidney. Left pleural effusion. EXAM: ULTRASOUND GUIDED LEFT THORACENTESIS MEDICATIONS: 1% Lidocaine = 10 mL. COMPLICATIONS: None immediate. PROCEDURE: An ultrasound guided thoracentesis was thoroughly discussed with the patient and questions answered. The benefits, risks, alternatives and complications were also discussed. The patient understands and wishes to proceed with the procedure. Written consent was obtained. Ultrasound was performed to localize and mark an adequate pocket of fluid in the left chest. The area was then prepped and draped in the normal sterile fashion. 1% Lidocaine was used for local anesthesia. Under ultrasound guidance a catheter was introduced. Thoracentesis was performed. The catheter was removed and a dressing applied. FINDINGS: A total of approximately 2 liters of bloody fluid was removed. Samples were sent to the laboratory as requested by the clinical team.  IMPRESSION: Successful ultrasound guided left thoracentesis yielding 2 liters of pleural fluid. Post procedure radiograph shows no pneumothorax. Read by:  Gareth Eagle, PA-C Electronically Signed   By: Lucrezia Europe M.D.   On: 08/15/2016 13:52     PERTINENT LAB RESULTS: CBC:  Recent Labs  08/29/16 0612 08/31/16 0423  WBC 14.8* 13.5*  HGB 8.2* 7.7*  HCT 27.5* 26.8*  PLT 550* 610*   CMET CMP     Component Value Date/Time   NA 140 08/31/2016 0423   NA 137 07/28/2016 1244   K 3.6 08/31/2016 0423   K 3.2 (L) 07/28/2016 1244   CL 112 (H) 08/31/2016 0423   CO2 22 08/31/2016 0423   CO2 22 07/28/2016 1244   GLUCOSE 106 (H) 08/31/2016 0423   GLUCOSE 218 (H) 07/28/2016 1244   BUN 12 08/31/2016 0423   BUN 19.2 07/28/2016 1244   CREATININE 1.18 (H) 08/31/2016 0423   CREATININE 2.0 (H)  07/28/2016 1244   CALCIUM 8.2 (L) 08/31/2016 0423   CALCIUM 8.9 07/28/2016 1244   PROT 5.7 (L) 08/29/2016 0612   PROT 6.6 07/28/2016 1244   ALBUMIN 1.6 (L) 08/29/2016 0612   ALBUMIN 2.4 (L) 07/28/2016 1244   AST 42 (H) 08/29/2016 0612   AST 10 07/28/2016 1244   ALT 19 08/29/2016 0612   ALT <6 07/28/2016 1244   ALKPHOS 122 08/29/2016 0612   ALKPHOS 111 07/28/2016 1244   BILITOT 0.7 08/29/2016 0612   BILITOT 0.54 07/28/2016 1244   GFRNONAA 47 (L) 08/31/2016 0423   GFRNONAA 47 (L) 08/24/2013 1654   GFRAA 54 (L) 08/31/2016 0423   GFRAA 54 (L) 08/24/2013 1654    GFR Estimated Creatinine Clearance: 53.5 mL/min (A) (by C-G formula based on SCr of 1.18 mg/dL (H)). No results for input(s): LIPASE, AMYLASE in the last 72 hours. No results for input(s): CKTOTAL, CKMB, CKMBINDEX, TROPONINI in the last 72 hours. Invalid input(s): POCBNP No results for input(s): DDIMER in the last 72 hours. No results for input(s): HGBA1C in the last 72 hours. No results for input(s): CHOL, HDL, LDLCALC, TRIG, CHOLHDL, LDLDIRECT in the last 72 hours. No results for input(s): TSH, T4TOTAL, T3FREE, THYROIDAB in the last 72  hours.  Invalid input(s): FREET3 No results for input(s): VITAMINB12, FOLATE, FERRITIN, TIBC, IRON, RETICCTPCT in the last 72 hours. Coags:  Recent Labs  08/31/16 0423  INR 1.57   Microbiology: No results found for this or any previous visit (from the past 240 hour(s)).  FURTHER DISCHARGE INSTRUCTIONS:  Get Medicines reviewed and adjusted: Please take all your medications with you for your next visit with your Primary MD  Laboratory/radiological data: Please request your Primary MD to go over all hospital tests and procedure/radiological results at the follow up, please ask your Primary MD to get all Hospital records sent to his/her office.  In some cases, they will be blood work, cultures and biopsy results pending at the time of your discharge. Please request that your primary care M.D. goes through all the records of your hospital data and follows up on these results.  Also Note the following: If you experience worsening of your admission symptoms, develop shortness of breath, life threatening emergency, suicidal or homicidal thoughts you must seek medical attention immediately by calling 911 or calling your MD immediately  if symptoms less severe.  You must read complete instructions/literature along with all the possible adverse reactions/side effects for all the Medicines you take and that have been prescribed to you. Take any new Medicines after you have completely understood and accpet all the possible adverse reactions/side effects.   Do not drive when taking Pain medications or sleeping medications (Benzodaizepines)  Do not take more than prescribed Pain, Sleep and Anxiety Medications. It is not advisable to combine anxiety,sleep and pain medications without talking with your primary care practitioner  Special Instructions: If you have smoked or chewed Tobacco  in the last 2 yrs please stop smoking, stop any regular Alcohol  and or any Recreational drug use.  Wear Seat  belts while driving.  Please note: You were cared for by a hospitalist during your hospital stay. Once you are discharged, your primary care physician will handle any further medical issues. Please note that NO REFILLS for any discharge medications will be authorized once you are discharged, as it is imperative that you return to your primary care physician (or establish a relationship with a primary care physician if you do not have one)  for your post hospital discharge needs so that they can reassess your need for medications and monitor your lab values.  Total Time spent coordinating discharge including counseling, education and face to face time equals  Signed: Shanker Ghimire 08/31/2016 2:15 PM

## 2016-08-31 NOTE — Consult Note (Signed)
[facility] Nurse wound consult note Reason for Consult: pressure injury Wound type: Multiple areas of full thickness skin breakdown over left and right buttock.  Appears to be related to both moisture and pressure.  Patient reports started when she had her hip replaced this year. She is also noted to have area of the right hip along the surgical incision that has not healed and continues to drain. Pressure Injury POA: Yes Measurement: Multiple areas over bilateral buttocks most 1cm x 1cm x 0.2cm, all open areas are 100% yellow Small opening proximal end of the right hip incision 0.3cm 0.3cm x 0.2cm; 100% yellow Wound bed:see above Drainage (amount, consistency, odor) sacrum and buttock drainage concerned for bacterial overgrowth, drainage is bright green but does not have strong odor. Serous drainage from the right hip wound Periwound: intact, evidence of some healing of areas over the buttocks Dressing procedure/placement/frequency: Will add silver hydrofiber to both the gluteal crease and buttock wounds due to the appearance of the drainage.  Will also add silver hydrofiber to the right hip wound, it has been present for a while to manage drainage.   Patient needs low air loss mattress at home due to the severity of her wounds.   I will let CM know this for hospice as well.  Discussed POC with patient and bedside nurse.  Re consult if needed, will not follow at this time. Thanks  Casmer Yepiz [employer], RN,CWOCN, CNS [phone])

## 2016-08-31 NOTE — Progress Notes (Signed)
[facility] Liaison   Received request from [facility] for family interest in [facility] services at home after discharge today. Chart reviewed and eligibility confirmed with hospice physician.   Spoke with patient at bedside and family member by phone to explain services and confirm interest. Plan is for patient to transfer home via car. DME discussed with patient and order has been placed with [facility] for hospital bed, overbed table, wheelchair, and oxyen. Confirmed with RNCM and MD plan for [facility] to see patient tomorrow.   Please send signed out of facility DNR with patient.  Please send scripts for any medications patient does not already have script for.  Please fax discharge summary to [phone].  Thank you,  Erling Conte, LCSW [phone]

## 2016-08-31 NOTE — Procedures (Signed)
(  L)PleurX catheter placed 5/29. Now no longer draining. New CT findings noted with numerous septations/loculations. Drain causing more discomfort than benefit. Hospice plans noted and pt requests PleurX removal.  (L)PleurX removed without difficulty. Occlusive dressing applied. No complications.  Ascencion Dike PA-C Interventional Radiology 08/31/2016 11:36 AM

## 2016-08-31 NOTE — Care Management Obs Status (Deleted)
[facility] NOTIFICATION   Patient Details  Name: Laura Neal MRN: [id number] Date of Birth: 19-Mar-1951   Medicare Observation Status Notification Given:  Yes    Bethena Roys, RN 08/31/2016, 11:39 AM

## 2016-09-09 ENCOUNTER — Other Ambulatory Visit: Payer: Medicare Other

## 2016-09-09 ENCOUNTER — Ambulatory Visit (HOSPITAL_BASED_OUTPATIENT_CLINIC_OR_DEPARTMENT_OTHER): Payer: Medicare Other | Admitting: Oncology

## 2016-09-09 DIAGNOSIS — C641 Malignant neoplasm of right kidney, except renal pelvis: Secondary | ICD-10-CM

## 2016-09-09 NOTE — Progress Notes (Signed)
Patient was unable to make it to her appointment today as she is bedbound and quite debilitated from her cancer. I met with her family today and I updated them about the natural course of this disease as well as what to expect.  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 Her performance status is declined rather rapidly and she was a poor candidate for any aggressive treatment.  I agree with hospice care and she is currently receiving excellent care from them. They understand that she has a terminal diagnosis with limited life expectancy.  I answered their questions to the best of my ability today and we will continue to provide support for the patient and her family in the future as needed.

## 2016-09-17 ENCOUNTER — Other Ambulatory Visit (HOSPITAL_COMMUNITY): Payer: Self-pay | Admitting: Pharmacy Technician

## 2016-09-27 DEATH — deceased

## 2017-06-29 ENCOUNTER — Encounter: Payer: Self-pay | Admitting: Physician Assistant

## 2018-02-22 IMAGING — CT CT BIOPSY
1 series · 1 of 30 positions shown · non-contrast
Comparison: none

INDICATION: Metastatic renal cell carcinoma

[Series 2: localizer · axial · 5.0mm · 0.69mm/px · 1 of 30 slices shown]
[im 16/30]
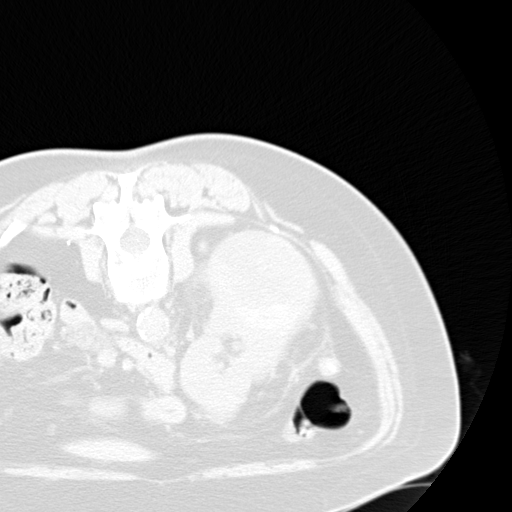

[1 of 30 positions shown; findings below may reference images not displayed]

EXAM:
CT-GUIDED BIOPSY LEFT RENAL MASS

MEDICATIONS:
1% LIDOCAINE LOCAL

ANESTHESIA/SEDATION:
3.0 mg IV Versed; 50 mcg IV Fentanyl

Moderate Sedation Time:  22

The patient was continuously monitored during the procedure by the
interventional radiology nurse under my direct supervision.

PROCEDURE:
The procedure, risks, benefits, and alternatives were explained to
the patient. Questions regarding the procedure were encouraged and
answered. The patient understands and consents to the procedure.

Previous imaging reviewed. Patient positioned prone. Noncontrast
localization CT performed. The large exophytic left renal mass was
localized. Overlying skin marked.

The LEFT POSTERIOR FLANK was prepped with ChloraPrep in a sterile
fashion, and a sterile drape was applied covering the operative
field. A sterile gown and sterile gloves were used for the
procedure.

Under CT guidance, a(n) 17 gauge guide needle was advanced into the
peripheral margin of the left renal mass from a retroperitoneal
approach.. 3 18 gauge core biopsies obtained. Samples placed in
formalin. Needle tract embolized with Gel-Foam slurry. The guide
needle was removed. Final imaging was performed.

Patient tolerated the procedure well without complication. Vital
sign monitoring by nursing staff during the procedure will continue
as patient is in the special procedures unit for post procedure
observation.
FINDINGS: The images document guide needle placement within the large left
renal mass. Post biopsy images demonstrate trace amount of
surrounding perinephric hemorrhage.

COMPLICATIONS:
None immediate.
IMPRESSION: Successful CT-guided core biopsy of the left renal mass

## 2021-05-26 ENCOUNTER — Encounter: Payer: Self-pay | Admitting: Gastroenterology
